# Patient Record
Sex: Male | Born: 1935 | Race: White | Hispanic: Yes | Smoking: Former smoker
Health system: Southern US, Community
[De-identification: ages and names within clinical notes are randomized; demographics above are authoritative.]

## PROBLEM LIST (undated history)

## (undated) DIAGNOSIS — I739 Peripheral vascular disease, unspecified: Secondary | ICD-10-CM

## (undated) DIAGNOSIS — H269 Unspecified cataract: Secondary | ICD-10-CM

## (undated) DIAGNOSIS — E785 Hyperlipidemia, unspecified: Secondary | ICD-10-CM

## (undated) DIAGNOSIS — Z992 Dependence on renal dialysis: Secondary | ICD-10-CM

## (undated) DIAGNOSIS — I10 Essential (primary) hypertension: Secondary | ICD-10-CM

## (undated) DIAGNOSIS — N186 End stage renal disease: Secondary | ICD-10-CM

## (undated) HISTORY — DX: Unspecified cataract: H26.9

## (undated) HISTORY — DX: Essential (primary) hypertension: I10

## (undated) HISTORY — DX: Hyperlipidemia, unspecified: E78.5

## (undated) HISTORY — DX: Peripheral vascular disease, unspecified: I73.9

---

## 2000-10-14 ENCOUNTER — Ambulatory Visit (HOSPITAL_COMMUNITY): Admission: RE | Admit: 2000-10-14 | Discharge: 2000-10-14 | Payer: Self-pay | Admitting: Cardiovascular Disease

## 2000-10-14 ENCOUNTER — Encounter: Payer: Self-pay | Admitting: Cardiovascular Disease

## 2001-04-13 ENCOUNTER — Inpatient Hospital Stay (HOSPITAL_COMMUNITY): Admission: AD | Admit: 2001-04-13 | Discharge: 2001-04-15 | Payer: Self-pay | Admitting: Cardiovascular Disease

## 2008-01-02 ENCOUNTER — Ambulatory Visit: Payer: Self-pay | Admitting: Surgery

## 2008-01-04 ENCOUNTER — Ambulatory Visit: Payer: Self-pay | Admitting: Surgery

## 2008-01-04 ENCOUNTER — Ambulatory Visit (HOSPITAL_COMMUNITY): Admission: RE | Admit: 2008-01-04 | Discharge: 2008-01-04 | Payer: Self-pay | Admitting: Surgery

## 2008-01-23 ENCOUNTER — Ambulatory Visit: Payer: Self-pay | Admitting: Surgery

## 2008-03-12 ENCOUNTER — Ambulatory Visit: Payer: Self-pay | Admitting: Surgery

## 2008-04-16 ENCOUNTER — Ambulatory Visit: Payer: Self-pay | Admitting: Surgery

## 2008-06-08 ENCOUNTER — Ambulatory Visit (HOSPITAL_COMMUNITY): Admission: RE | Admit: 2008-06-08 | Discharge: 2008-06-08 | Payer: Self-pay | Admitting: Vascular Surgery

## 2008-06-08 ENCOUNTER — Ambulatory Visit: Payer: Self-pay | Admitting: Vascular Surgery

## 2008-06-18 ENCOUNTER — Ambulatory Visit: Payer: Self-pay | Admitting: Surgery

## 2008-06-22 ENCOUNTER — Ambulatory Visit (HOSPITAL_COMMUNITY): Admission: RE | Admit: 2008-06-22 | Discharge: 2008-06-22 | Payer: Self-pay | Admitting: Surgery

## 2009-08-30 ENCOUNTER — Ambulatory Visit: Payer: Self-pay | Admitting: Vascular Surgery

## 2009-08-30 ENCOUNTER — Ambulatory Visit (HOSPITAL_COMMUNITY): Admission: RE | Admit: 2009-08-30 | Discharge: 2009-08-30 | Payer: Self-pay | Admitting: Vascular Surgery

## 2009-09-02 ENCOUNTER — Inpatient Hospital Stay (HOSPITAL_COMMUNITY): Admission: EM | Admit: 2009-09-02 | Discharge: 2009-09-02 | Payer: Self-pay | Admitting: Emergency Medicine

## 2009-10-08 ENCOUNTER — Ambulatory Visit: Payer: Self-pay | Admitting: Vascular Surgery

## 2009-10-24 ENCOUNTER — Ambulatory Visit (HOSPITAL_COMMUNITY): Admission: RE | Admit: 2009-10-24 | Discharge: 2009-10-24 | Payer: Self-pay | Admitting: Vascular Surgery

## 2009-10-24 ENCOUNTER — Ambulatory Visit: Payer: Self-pay | Admitting: Vascular Surgery

## 2009-10-24 ENCOUNTER — Emergency Department (HOSPITAL_COMMUNITY): Admission: EM | Admit: 2009-10-24 | Discharge: 2009-10-25 | Payer: Self-pay | Admitting: Emergency Medicine

## 2009-10-24 HISTORY — PX: AV FISTULA PLACEMENT: SHX1204

## 2009-12-31 ENCOUNTER — Ambulatory Visit: Payer: Self-pay | Admitting: Vascular Surgery

## 2010-01-02 ENCOUNTER — Ambulatory Visit (HOSPITAL_COMMUNITY): Admission: RE | Admit: 2010-01-02 | Discharge: 2010-01-02 | Payer: Self-pay | Admitting: Vascular Surgery

## 2010-01-02 ENCOUNTER — Ambulatory Visit: Payer: Self-pay | Admitting: Vascular Surgery

## 2010-01-28 ENCOUNTER — Ambulatory Visit: Payer: Self-pay | Admitting: Vascular Surgery

## 2010-02-20 ENCOUNTER — Ambulatory Visit: Payer: Self-pay | Admitting: Vascular Surgery

## 2010-02-20 ENCOUNTER — Inpatient Hospital Stay (HOSPITAL_COMMUNITY): Admission: RE | Admit: 2010-02-20 | Discharge: 2010-02-24 | Payer: Self-pay | Admitting: Vascular Surgery

## 2010-02-20 HISTORY — PX: PR VEIN BYPASS GRAFT,AORTO-FEM-POP: 35551

## 2010-03-17 ENCOUNTER — Ambulatory Visit: Payer: Self-pay | Admitting: Vascular Surgery

## 2010-03-18 ENCOUNTER — Ambulatory Visit: Payer: Self-pay | Admitting: Vascular Surgery

## 2010-06-09 ENCOUNTER — Encounter (INDEPENDENT_AMBULATORY_CARE_PROVIDER_SITE_OTHER): Payer: Medicare Other

## 2010-06-09 DIAGNOSIS — Z48812 Encounter for surgical aftercare following surgery on the circulatory system: Secondary | ICD-10-CM

## 2010-06-09 DIAGNOSIS — I739 Peripheral vascular disease, unspecified: Secondary | ICD-10-CM

## 2010-06-17 LAB — RENAL FUNCTION PANEL
Albumin: 3 g/dL — ABNORMAL LOW (ref 3.5–5.2)
Calcium: 8.2 mg/dL — ABNORMAL LOW (ref 8.4–10.5)
GFR calc Af Amer: 9 mL/min — ABNORMAL LOW (ref >60)
GFR calc non Af Amer: 8 mL/min — ABNORMAL LOW (ref >60)
Phosphorus: 5.2 mg/dL — ABNORMAL HIGH (ref 2.3–4.6)
Potassium: 4.3 mEq/L (ref 3.5–5.1)
Sodium: 135 mEq/L (ref 135–145)

## 2010-06-17 LAB — URINALYSIS, ROUTINE W REFLEX MICROSCOPIC
Glucose, UA: NEGATIVE mg/dL
Hgb urine dipstick: NEGATIVE
Specific Gravity, Urine: 1.018 (ref 1.005–1.030)
pH: 5.5 (ref 5.0–8.0)

## 2010-06-17 LAB — COMPREHENSIVE METABOLIC PANEL
ALT: 23 U/L (ref 0–53)
AST: 22 U/L (ref 0–37)
Alkaline Phosphatase: 90 U/L (ref 39–117)
CO2: 34 mEq/L — ABNORMAL HIGH (ref 19–32)
Calcium: 9.5 mg/dL (ref 8.4–10.5)
Chloride: 92 mEq/L — ABNORMAL LOW (ref 96–112)
GFR calc non Af Amer: 9 mL/min — ABNORMAL LOW (ref >60)
Glucose, Bld: 238 mg/dL — ABNORMAL HIGH (ref 70–99)
Sodium: 134 mEq/L — ABNORMAL LOW (ref 135–145)
Total Bilirubin: 0.4 mg/dL (ref 0.3–1.2)

## 2010-06-17 LAB — GLUCOSE, CAPILLARY
Glucose-Capillary: 126 mg/dL — ABNORMAL HIGH (ref 70–99)
Glucose-Capillary: 156 mg/dL — ABNORMAL HIGH (ref 70–99)
Glucose-Capillary: 162 mg/dL — ABNORMAL HIGH (ref 70–99)
Glucose-Capillary: 170 mg/dL — ABNORMAL HIGH (ref 70–99)
Glucose-Capillary: 199 mg/dL — ABNORMAL HIGH (ref 70–99)
Glucose-Capillary: 211 mg/dL — ABNORMAL HIGH (ref 70–99)
Glucose-Capillary: 218 mg/dL — ABNORMAL HIGH (ref 70–99)
Glucose-Capillary: 237 mg/dL — ABNORMAL HIGH (ref 70–99)
Glucose-Capillary: 241 mg/dL — ABNORMAL HIGH (ref 70–99)
Glucose-Capillary: 255 mg/dL — ABNORMAL HIGH (ref 70–99)
Glucose-Capillary: 98 mg/dL (ref 70–99)

## 2010-06-17 LAB — CBC
HCT: 31.7 % — ABNORMAL LOW (ref 39.0–52.0)
HCT: 38.7 % — ABNORMAL LOW (ref 39.0–52.0)
Hemoglobin: 10.3 g/dL — ABNORMAL LOW (ref 13.0–17.0)
Hemoglobin: 12.9 g/dL — ABNORMAL LOW (ref 13.0–17.0)
MCHC: 31.7 g/dL (ref 30.0–36.0)
MCHC: 32.5 g/dL (ref 30.0–36.0)
MCHC: 33.3 g/dL (ref 30.0–36.0)
MCV: 94.8 fL (ref 78.0–100.0)
Platelets: 182 10*3/uL (ref 150–400)
RBC: 3.05 MIL/uL — ABNORMAL LOW (ref 4.22–5.81)
RBC: 4.13 MIL/uL — ABNORMAL LOW (ref 4.22–5.81)
RDW: 14.6 % (ref 11.5–15.5)
RDW: 14.8 % (ref 11.5–15.5)
WBC: 10.2 10*3/uL (ref 4.0–10.5)
WBC: 7.6 10*3/uL (ref 4.0–10.5)

## 2010-06-17 LAB — CROSSMATCH
Antibody Screen: NEGATIVE
Unit division: 0

## 2010-06-17 LAB — POCT I-STAT 4, (NA,K, GLUC, HGB,HCT)
Hemoglobin: 13.6 g/dL (ref 13.0–17.0)
Sodium: 136 mEq/L (ref 135–145)

## 2010-06-17 LAB — BASIC METABOLIC PANEL
BUN: 53 mg/dL — ABNORMAL HIGH (ref 6–23)
Calcium: 8 mg/dL — ABNORMAL LOW (ref 8.4–10.5)
Creatinine, Ser: 9.23 mg/dL — ABNORMAL HIGH (ref 0.4–1.5)
GFR calc non Af Amer: 6 mL/min — ABNORMAL LOW (ref >60)
GFR calc non Af Amer: 7 mL/min — ABNORMAL LOW (ref >60)
Glucose, Bld: 152 mg/dL — ABNORMAL HIGH (ref 70–99)
Glucose, Bld: 184 mg/dL — ABNORMAL HIGH (ref 70–99)
Potassium: 5 mEq/L (ref 3.5–5.1)
Sodium: 132 mEq/L — ABNORMAL LOW (ref 135–145)

## 2010-06-17 LAB — URINE MICROSCOPIC-ADD ON

## 2010-06-17 LAB — ABO/RH: ABO/RH(D): O POS

## 2010-06-17 LAB — PROTIME-INR: Prothrombin Time: 13.3 seconds (ref 11.6–15.2)

## 2010-06-17 LAB — SURGICAL PCR SCREEN: Staphylococcus aureus: NEGATIVE

## 2010-06-18 NOTE — Procedures (Unsigned)
BYPASS GRAFT EVALUATION  INDICATION:  Follow up bypass graft placement.  HISTORY: Diabetes:  Yes. Cardiac:  No. Hypertension:  Yes. Smoking:  Previous. Previous Surgery:  Left femoral-to-popliteal bypass graft, 02/20/2010.  SINGLE LEVEL ARTERIAL EXAM                              RIGHT              LEFT Brachial:                    127                AVF Anterior tibial:             49                 81 Posterior tibial:            79                 90 Peroneal: Ankle/brachial index:        0.62               0.71  PREVIOUS ABI:  Date: 03/17/2010  RIGHT:  0.63  LEFT:  0.69  LOWER EXTREMITY BYPASS GRAFT DUPLEX EXAM:  DUPLEX:  Patent left femoral-to-popliteal bypass graft with no evidence of stenosis.  IMPRESSION: 1. Stable ankle brachial indices. 2. Patent bypass graft with velocity measurements shown on the     following worksheet.  ___________________________________________ [name]. Hart Rochester, M.D.  EM/MEDQ  D:  06/09/2010  T:  06/09/2010  Job:  [id number]

## 2010-06-19 LAB — POCT I-STAT, CHEM 8
BUN: 34 mg/dL — ABNORMAL HIGH (ref 6–23)
Creatinine, Ser: 7 mg/dL — ABNORMAL HIGH (ref 0.4–1.5)
Glucose, Bld: 146 mg/dL — ABNORMAL HIGH (ref 70–99)
Sodium: 135 mEq/L (ref 135–145)
TCO2: 26 mmol/L (ref 0–100)

## 2010-06-21 LAB — POCT I-STAT 4, (NA,K, GLUC, HGB,HCT)
Glucose, Bld: 125 mg/dL — ABNORMAL HIGH (ref 70–99)
HCT: 39 % (ref 39.0–52.0)
Hemoglobin: 13.3 g/dL (ref 13.0–17.0)
Sodium: 142 mEq/L (ref 135–145)

## 2010-06-21 LAB — SURGICAL PCR SCREEN: MRSA, PCR: NEGATIVE

## 2010-06-23 LAB — POCT I-STAT, CHEM 8
HCT: 30 % — ABNORMAL LOW (ref 39.0–52.0)
Hemoglobin: 10.2 g/dL — ABNORMAL LOW (ref 13.0–17.0)
Potassium: 3.6 mEq/L (ref 3.5–5.1)
Sodium: 139 mEq/L (ref 135–145)

## 2010-06-23 LAB — GLUCOSE, CAPILLARY
Glucose-Capillary: 108 mg/dL — ABNORMAL HIGH (ref 70–99)
Glucose-Capillary: 115 mg/dL — ABNORMAL HIGH (ref 70–99)
Glucose-Capillary: 59 mg/dL — ABNORMAL LOW (ref 70–99)

## 2010-06-23 LAB — POCT I-STAT 4, (NA,K, GLUC, HGB,HCT): Hemoglobin: 10.9 g/dL — ABNORMAL LOW (ref 13.0–17.0)

## 2010-07-17 LAB — GLUCOSE, CAPILLARY
Glucose-Capillary: 81 mg/dL (ref 70–99)
Glucose-Capillary: 123 mg/dL — ABNORMAL HIGH (ref 70–99)
Glucose-Capillary: 124 mg/dL — ABNORMAL HIGH (ref 70–99)

## 2010-07-17 LAB — POCT I-STAT 4, (NA,K, GLUC, HGB,HCT)
Glucose, Bld: 122 mg/dL — ABNORMAL HIGH (ref 70–99)
HCT: 34 % — ABNORMAL LOW (ref 39.0–52.0)
Potassium: 4 mEq/L (ref 3.5–5.1)
Sodium: 139 mEq/L (ref 135–145)

## 2010-08-19 NOTE — Assessment & Plan Note (Signed)
OFFICE VISIT   [name], [name]  DOB:  [birth year]                                       01/23/2008  ZOXWR#:[id number]   HISTORY:  This is the 73-year gentleman who underwent right upper arm AV  fistula performed on September 30th.  He comes in today for follow-up.   EXAM:  There is a thrill within his fistula.  There is no evidence of  infection, he does not have evidence of hand ischemia.  I am going to  see him back in 6 weeks to evaluate his maturation of his fistula.   Jorge Ny, MD  Electronically Signed   VWB/MEDQ  D:  01/23/2008  T:  01/24/2008  Job:  [id number]   cc:   [employer]

## 2010-08-19 NOTE — Assessment & Plan Note (Signed)
OFFICE VISIT   [name], [name]  DOB:  1933/08/02                                       01/02/2008  PPIRJ#:[id number]   REASON FOR VISIT:  Evaluate for dialysis.   HISTORY:  This is a 77 year old gentleman with chronic kidney disease,  not yet requiring dialysis, that I am seeing at the request of Dr. Hyman Hopes  for placement of permanent access.  The patient had recent deterioration  in his creatinine that measured 1.9 back in October 2008.  It has  progressively and it is now greater than 3.  He comes in for discussion  of permanent access placement.  He is left-handed.   PAST MEDICAL HISTORY:  Significant for:  1. Diabetes type 2.  2. Chronic renal disease.  3. History of hyperlipidemia.  4. Hypertension.  5. Congestive heart failure.  6. Intermittent claudication.  7. Glaucoma.  8. Cataracts.   REVIEW OF SYSTEMS:  GENERAL:  Positive for weight gain.  CARDIAC:  Negative.  PULMONARY:  Negative.  GI:  Negative.  GU:  Frequent urination.  VASCULAR:  Pain in legs walking.  History of blood clot the vein.  NEURO:  Negative.  ORTHO:  Negative.  PSYCH:  Negative.  ENT:  Recent change in eyesight.  HEME:  Negative.   FAMILY HISTORY:  Significant for coronary disease in his father.   SOCIAL HISTORY:  He is single with 7 children.  He does not smoke.  He  has a history of smoking but quit 27 years ago.  Does not drink alcohol.   MEDICATIONS:  Aspirin, Lasix, hydralazine, iron, Plavix, Vytorin,  Isordil, Norvasc, clonidine, Lantus, Epogen, Ergo and Rena-Vite.   ALLERGIES:  None.   PHYSICAL EXAMINATION:  Blood pressure is 164/62, pulse is 43.  General:  He is well-appearing, in no acute distress.  Cardiovascular:  Bradycardic but regular, respirations are nonlabored.  Extremities:  Reveal palpable radial and brachial pulse on the right.  He is alert and  x3.  Neuro:  Cranial nerves II through XII are grossly intact.   DIAGNOSTIC STUDIES:  The  patient had vein mapping today.  This reveals a  marginal cephalic vein measuring 0.22-0.38 in the right upper arm.   ASSESSMENT AND PLAN:  Chronic kidney disease.   Plan:  Given the patient is not yet on dialysis, I feel it is reasonable  to proceed with an attempt at cephalic vein fistula.  This will be done  in the upper arm.  I have discussed the risks and benefits with this  patient including an approximately 80% chance of maturation and the risk  of steal syndrome infection.  I am not going to stop the patient's  Plavix.  He is going to be scheduled to have this procedure done this  Wednesday, September 30th, this will be a left upper arm AV fistula.  The risks and benefits were discussed with patient.   Jorge Ny, MD  Electronically Signed   VWB/MEDQ  D:  01/02/2008  T:  01/03/2008  Job:  [id number]

## 2010-08-19 NOTE — Assessment & Plan Note (Signed)
OFFICE VISIT   [name], [name]  DOB:  [birth year]                                       01/28/2010  VHQIO#:[id number]   The patient is a 77 year old gentleman who returns today for discussion  of possible lower extremity bypass grafting.  This patient has been  having severe claudication symptoms in the left leg for the last several  months with tingling in the foot at rest and inability to walk more than  1-2 minutes because of calf discomfort.  The right calf begins to have  symptoms following stopping because of the left calf.  Has no history of  nonhealing ulcers or infection or gangrene.  He had a recent angiogram  performed by Dr. Imogene Burn which reveals bilateral superficial femoral  occlusions, left side having been previously stented many years ago with  reconstitution of a good above-knee popliteal artery on the right and a  below-knee popliteal artery on the left with severe tibial disease.   CHRONIC MEDICAL PROBLEMS:  1. End-stage renal disease on hemodialysis Monday, Wednesday, Friday.  2. Diabetes mellitus type 2.  3. Hyperlipidemia.  4. Hypertension.  5. Congestive heart failure in the past, now resolved.   SOCIAL HISTORY:  He is single with 7 children.  Does not smoke.  Has a  history smoking, quit 27 years ago.  Does not use alcohol.   FAMILY HISTORY:  Positive for coronary artery disease in his father.   REVIEW OF SYSTEMS:  Denies any chest pain.  Does have chronic renal  insufficiency, dialysis Monday, Wednesday, Friday.  He is left-handed.  He has an upper arm fistula which is being utilized on the left.  All  other systems are negative in review of systems.   PHYSICAL EXAMINATION:  Vital signs:  Blood pressure 111/45, heart rate  68, respirations 16.  General:  He is a well-developed, well-nourished  male in no apparent distress, alert, oriented x3.  He does not speak  [place].  HEENT:  Normal for age.  EOMs intact.  Lungs:  Clear  to  auscultation.  No rhonchi or wheezing.  Cardiovascular:  Regular rhythm,  no murmurs.  Carotid pulses 3+ no bruits.  There is a right Diatek  catheter in the right infraclavicular area exiting on the chest wall.  Abdomen:  Soft, nontender with no masses.  Musculoskeletal:  Free of  major deformities.  Neurologic:  Reveals decreased sensation in the left  foot.  Skin:  Reveals no rashes.  The lower extremity exam reveals  dependent rubor in the left foot.  He has 3+ femoral pulses with absent  popliteal and distal pulses bilaterally.  Surface veins including  greater saphenous appear small bilaterally.   The previous ABIs revealed the left leg to be 0.38 with the right leg to  be 0.56.   PLAN:  Admit the patient on November 17 for left femoral popliteal  bypass graft (below-knee) using 6-mm Gore-Tex most likely.  The risks  and benefits have been thoroughly discussed and the patient would like  to proceed.     Quita Skye Hart Rochester, M.D.  Electronically Signed   JDL/MEDQ  D:  01/28/2010  T:  01/29/2010  Job:  [id number]

## 2010-08-19 NOTE — Procedures (Signed)
DIALYSIS GRAFT DUPLEX EVALUATION   INDICATION:  Right AVF evaluation due to nonmaturing.   HISTORY:  Right upper extremity AVF on 01/04/2008 by Dr. Myra Gianotti   DUPLEX:  .  Patent AVF, however, narrowing mid brachium of right  cephalic vein with velocities of 704 cm/s.  Branch noted in same area,  just slightly more distal.                                    Duplex Velocities  Inflow artery                   158 cm/s  Inflow anastomosis              115 cm/s  Mid arterial limb  Mid graft  Mid venous limb  Outflow anastomosis  Outflow vein                    72 cm/s   IMPRESSION:  1. Patent right arteriovenous fistula.  2. Right cephalic vein, mid brachium, appears narrowed with velocities      of 704 cm/s.  3. Small branch noted in the same area, just slightly more distal.    ___________________________________________  V. Charlena Cross, MD   AS/MEDQ  D:  04/16/2008  T:  04/16/2008  Job:  [id number]

## 2010-08-19 NOTE — Assessment & Plan Note (Signed)
OFFICE VISIT   MOODY, ROBBEN  DOB:  October 18, 1933                                       04/16/2008  NGEXB#:[id number]   REASON FOR VISIT:  Followup fistula.   HISTORY:  This is a 77 year old gentleman who is left-handed who  underwent a right upper extremity AV fistula on 01/04/2008.  I have not  been very pleased with the way it has matured.  He had an ultrasound  today which shows narrowing of the cephalic vein with elevated  velocities.  There are no dominant branches.   PHYSICAL EXAMINATION:  Vital signs:  On physical examination his blood  pressure is 196/71, pulse is 66.  General:  He is in no distress.  The  fistula has a good thrill proximally, however, this is diminished in the  mid upper arm.   PLAN:  As the patient is not yet on dialysis I am reluctant to proceed  with a fistulogram.  I think he does have stenosis and/or a sclerotic  portion of his cephalic vein which is preventing this from maturing.  I  do think we would potentially be able to salvage this with balloon  venoplasty.  However, given the risk of putting him on dialysis due to  the dye I am a little reluctant to do this at this time.  I think he  potentially could, the proximal portion could potentially be used as  access should he require dialysis in the near future.  Of course our  other option would be converting this to a right forearm graft.  However, at this time I would like to see if we could salvage this  fistula.  We will continue to follow him.  I will see him back in 6  weeks.   Jorge Ny, MD  Electronically Signed   VWB/MEDQ  D:  04/16/2008  T:  04/18/2008  Job:  [id number]   cc:   Dr Hyman Hopes

## 2010-08-19 NOTE — Op Note (Signed)
NAMEJOIE, REAMER            ACCOUNT NO.:  [id number]   MEDICAL RECORD NO.:  [id number]          PATIENT TYPE:  AMB   LOCATION:  SDS                          FACILITY:  [facility]   PHYSICIAN:  Juleen China IV, MDDATE OF BIRTH:  [birth year]   DATE OF PROCEDURE:  DATE OF DISCHARGE:  06/22/2008                               OPERATIVE REPORT   PREOPERATIVE DIAGNOSIS:  End-stage renal disease.   POSTOPERATIVE DIAGNOSIS:  End-stage renal disease.   PROCEDURE PERFORMED:  1. Ligation of right upper arm arteriovenous fistula.  2. Right forearm arteriovenous Gore-Tex graft with 6-mm graft.   ASSISTANT:  Jerold Coombe, P.A.   ANESTHESIA:  MAC.   BLOOD LOSS:  Minimal.   INDICATIONS:  This is a 77 year old gentleman with end-stage renal  disease.  He had previously placed a right upper arm AV fistula which  did not mature.  He has no adequate vein for fistula and therefore we  have decided to take his fistula down and place a forearm graft.   PROCEDURE:  The patient was identified in the holding area and taken to  room 6.  He was placed supine on the table.  MAC anesthesia was  administered.  The patient was prepped and draped in standard sterile  fashion.  A time-out was called.  Antibiotics were given.  A 1%  lidocaine was used for local anesthesia.  The previous incision in the  antecubital crease was anesthetized and then opened with #10 blade.  Cautery was used to divide the subcutaneous tissue.  The arteriovenous  anastomosis of the fistula was dissected out first and mobilized the  cephalic vein for a distance of approximately 2-cm proximal to the  artery.  The artery was mobilized proximal and distal.  I inspected the  brachial vein in this region.  I did not feel like they were adequate  for graft insertion.   Next, I used ultrasound to evaluate the basilic vein, the basilic vein  appeared to be about 3-mm by ultrasound and therefore I elected to make  a longitudinal  incision above the antecubital crease over the basilic  vein.  Through this incision, the basilic vein was identified and  exposed and mobilized proximally and distally for about 3 cm.  Basilic  vein on initial inspection was approximately 3-mm, it did spasm  significantly upon manipulation.  At this point, a counter-incision was  made in the distal forearm.  Subcutaneous tunnel was created between 2  incisions.  The graft of 6-mm stretch, Gore-Tex graft was brought  through the tunnel.  The patient was then given systemic heparinization.  The previous fistula was ligated with a 2-0 silk tie.  After the heparin  circulated clamps were used to occlude the artery.  The previous  arteriovenous anastomosis of his upper arm fistula was taken down.  The  Prolene sutures were removed.  The vein was then discarded.  I inspected  the artery at this time in point, the artery was healthy in appearance.  I elected to perform an end-to-side anastomosis using a running 6-0  Prolene.  Prior to completion of  anastomosis, the artery was flushed  appropriately.  The anastomosis was then secured.  There was excellent  pulsatile flow to the graft.   Next, the graft was flushed with heparin saline and reoccluded.  The  venous anastomosis was then performed next.  The vein was occluded and  venotomy was made with #11 blade which was extended with Potts scissors.  The graft was then beveled to fit the size of the venotomy, an end-to-  side anastomosis was created with a running 6-0 Prolene.  Prior to  completion, the graft was appropriately flushed.  The anastomosis was  then secured.  This was an end-to-side anastomosis.  The heparin was  then reversed with 50 mg of protamine.  The patient had excellent flow  through his graft.  He had strongly palpable radial pulse with graft  occlusion and faintly palpable pulse with graft being open.  After  hemostasis was achieved, the wounds were closed with deep layer of  3-0  and a 3-0 Vicryl and the skin was closed with 4-0 Vicryl followed by  Dermabond.  The patient tolerated the procedure well.  There were no  complications.           ______________________________  V. Charlena Cross, MD  Electronically Signed     VWB/MEDQ  D:  06/22/2008  T:  06/23/2008  Job:  [id number]

## 2010-08-19 NOTE — Assessment & Plan Note (Signed)
OFFICE VISIT   [name], [name]  DOB:  [birth year]                                       03/17/2010  ZOXWR#:[id number]   The patient returns today for initial followup regarding his left  femoral-popliteal saphenous vein graft I performed November 17 for rest  ischemia and severe claudication in the left leg.  He has done very well  since his procedure with great improvement in his ambulation and no  particular symptoms in the left leg.   On exam his incisions have healed nicely.  He has a 3+ popliteal graft  pulse easily palpable with well-perfused left foot.  Blood pressure  133/44, heart rate 80, respirations 18.   Vascular lab today revealed increased ABI from 0.38 to 0.69 on the left  and stable index of 0.63 on the right.  I reassured him regarding these  findings.  He will return in 3 months for a duplex scan of the graft and  to be followed on the protocol.     Quita Skye Hart Rochester, M.D.  Electronically Signed   JDL/MEDQ  D:  03/17/2010  T:  03/17/2010  Job:  [id number]

## 2010-08-19 NOTE — Procedures (Signed)
CEPHALIC VEIN MAPPING   INDICATION:  AV fistula placement.   HISTORY:  End-stage renal disease.   EXAM:   The right cephalic vein is compressible.   The right basilic vein is partially thrombosed.   Diameter measurements range from 0.27 to 0.22 in the cephalic vein.   The left cephalic vein is compressible.   Diameter measurements range from 0.41 to 0.17.   The left basilic vein has some intimal thickening in the forearm  segments and the diameter measurements range from 0.45 to 0.13.   See attached worksheet for all measurements.   IMPRESSION:  1. Patent right and left cephalic vein with diameters as noted above.  2. Right basilic vein is partially thrombosed.  3. The left basilic vein has some intimal thickening in the forearm      segment.   ___________________________________________  Quita Skye. Hart Rochester, M.D.   NT/MEDQ  D:  10/08/2009  T:  10/08/2009  Job:  [id number]

## 2010-08-19 NOTE — Assessment & Plan Note (Signed)
OFFICE VISIT   [name], [name]  DOB:  Aug 19, 1933                                       12/31/2009  ZOXWR#:[id number]   Patient returns for follow-up regarding his left upper arm AV fistula  which I created July 21 for end-stage renal disease and lower extremity  claudication symptoms.  The fistula is functioning well.  He  occasionally will have some cramping in the hand while on dialysis but  states this is not severe.  He has no numbness in the left hand.  He is  having severe claudication symptoms with approaching rest pain in the  left foot.  He awakens at night with foot numb and states that the toes  ache.  He has had no nonhealing ulcers or infection.  He has had  previous stenting at some point in the past, I think in his superficial  femoral arteries,  but that is not confirmed.  It was done at [facility] 10 years ago by an unknown physician.  He was told that these  stents were blocked 5 or 6 years ago.  Symptoms have continued to  worsen.  He does have diabetes, hypertension, hyperlipidemia, and  congestive heart failure by history as well as his end-stage renal  disease, dialyzing Monday, Wednesday, and Friday.   PHYSICAL EXAMINATION:  Blood pressure 140/55, heart rate 63, temperature  98.  Left upper arm AV fistula has an excellent pulse and palpable  thrill.  The left hand is warm and well-perfused.  He has 3+ femoral  pulses bilaterally with no popliteal or distal pulses.  He has dependent  rubor on the left foot.  Saphenous veins are intact but somewhat small  on physical exam.   ABI today is 0.38 on the left and 0.56 on the right with totally  occluded superficial femoral arteries bilaterally.   We will plan an angiogram by Dr. Imogene Burn on Thursday, September 29 to see  what options are available.  I think he will need revascularization in  the left leg, which will likely require bypassing since he has had  previous stenting several  years ago.     Quita Skye Hart Rochester, M.D.  Electronically Signed   JDL/MEDQ  D:  12/31/2009  T:  01/01/2010  Job:  [id number]

## 2010-08-19 NOTE — Assessment & Plan Note (Signed)
OFFICE VISIT   [name], [name]  DOB:  [birth year]                                       10/08/2009  VHQIO#:[id number]   The patient is a patient with end-stage renal disease who has had  multiple failed accesses in the right upper extremity.  This began with  a right upper extremity AV fistula.  He has never had access in the left  arm.  He is referred today for further access.  Most recent __________  was thrombectomy and revision of the right forearm AV graft.  He has  never had a right upper arm graft.   The patient also states that he does have bilateral leg claudication and  has had previous stents placed in another hospital, and we have no  information regarding this.  He is able to ambulate about a half-block  before stopping with calf discomfort.   On exam today his blood pressure is 94/54, heart rate 64, respirations  16.  Upper extremity exam reveals 3+ brachial pulses bilaterally.  Right  arm has a thrombosed AV graft in the forearm.  Left arm has no evidence  of access previously.  Today I ordered vein mapping to both upper  extremities, which I reviewed and interpreted.  The left upper extremity  appears to have a widely patent cephalic vein down to the antecubital  area and extending into the proximal forearm.  The left arm does not  have any veins adequate for fistula.   I have scheduled him for a left arm AV fistula to be done on Thursday,  July 21, as an outpatient at [facility].  When he returns, we will  evaluate his lower extremities with a duplex scan of his arterial system  with ABIs to determine the next step for that evaluation.     Quita Skye Hart Rochester, M.D.  Electronically Signed   JDL/MEDQ  D:  10/08/2009  T:  10/09/2009  Job:  [id number]

## 2010-08-19 NOTE — Op Note (Signed)
[name], [name]            ACCOUNT NO.:  [id number]   MEDICAL RECORD NO.:  [id number]          PATIENT TYPE:  AMB   LOCATION:  SDS                          FACILITY:  [facility]   PHYSICIAN:  Di Kindle. Edilia Bo, M.D.DATE OF BIRTH:  01-06-1934   DATE OF PROCEDURE:  06/08/2008  DATE OF DISCHARGE:  06/08/2008                               OPERATIVE REPORT   PREOPERATIVE DIAGNOSIS:  Chronic kidney disease.   POSTOPERATIVE DIAGNOSIS:  Chronic kidney disease.   PROCEDURE:  Ultrasound-guided access to the right IJ and placement of 28-  cm Diatek catheter.   SURGEON:  Di Kindle. Edilia Bo, MD   ASSISTANT:  Nurse.   ANESTHESIA:  Local with sedation.   TECHNIQUE:  The patient was taken to the operating room and the  ultrasound scanner was used to mark both IJs, which appeared to be  patent.  The neck and upper chest were prepped and draped in the usual  sterile fashion.  The patient was placed in Trendelenburg.  The skin was  anesthetized with 1% lidocaine.  Under ultrasound guidance, the right IJ  was cannulated and the guidewire introduced into the superior vena cava  under fluoroscopic control.  The exit site for the catheter was selected  and the skin anesthetized between the two areas.  Catheter was then  brought through the tunnel, cut to the appropriate length and the distal  ports were attached.  Both ports withdrew easily.  We then flushed with  heparinized saline and filled with concentrated heparin.  The catheter  was secured at its exit site with a 3-0 nylon suture.  The IJ  cannulation site was closed with a 4-0 subcuticular stitch.  Sterile  dressing was applied.  The patient tolerated the procedure well and was  transferred to the recovery room in satisfactory condition.  All needle  and sponge counts were correct.      Di Kindle. Edilia Bo, M.D.  Electronically Signed     CSD/MEDQ  D:  06/08/2008  T:  06/08/2008  Job:  [id number]

## 2010-08-19 NOTE — Assessment & Plan Note (Signed)
OFFICE VISIT   [name], [name]  DOB:  1933-04-21                                       06/18/2008  XLKGM#:[id number]   REASON FOR VISIT:  Dialysis access.   HISTORY:  This is a 77 year old gentleman who is left-handed.  He  underwent a right upper arm fistula on January 04, 2008.  At that time  he was not yet on dialysis.  He had difficulty maturing.  We have been  watching this as I had not wanted to perform a dye study given that he  is not yet on dialysis.  Unfortunately he has progressed to dialysis and  now has a right-sided catheter.  He comes in today for access planning.   PHYSICAL EXAMINATION:  VITAL SIGNS:  He is afebrile, blood pressure  109/57, pulse is 60.  GENERAL:  He is in no distress.  CARDIOVASCULAR:  Regular rate and rhythm.  EXTREMITIES:  The right arm fistula, I feel a pulse for the first 3 cm  of his fistula and then it disappears.  I presume this is occluded.  The  vein appears to be small by my exam. This is much different than it was  the last time saw him.   ASSESSMENT/PLAN:  End-stage renal disease needing access.   PLAN:  I think it is best at this point to place a forearm graft.  The  patient's vein in his left arm is equally disease as his right arm.  I  discussed the risks and benefits in proceeding with a right forearm AV  Gore-Tex graft with the daughter who served as a Nurse, learning disability.  At that  time of his graft I would also ligate his fistula.  This will be  scheduled for Friday, June 22, 2008.   Jorge Ny, MD  Electronically Signed   VWB/MEDQ  D:  06/18/2008  T:  06/20/2008  Job:  [id number]   cc:   Elvis Coil

## 2010-08-19 NOTE — Procedures (Signed)
CEPHALIC VEIN MAPPING   INDICATION:  End-stage renal disease.   HISTORY:  End-stage renal disease.   EXAM:  Bilateral cephalic vein duplex.   The right cephalic vein is compressible.   Diameter measurements range from 0.23 to 0.22.   The left cephalic vein is compressible.   Diameter measurements range from 0.25 to 0.27.   See attached worksheet for all measurements.   IMPRESSION:  Patent bilateral cephalic veins which are not of acceptable  diameter for use as a dialysis access site.   ___________________________________________  V. Charlena Cross, MD   MG/MEDQ  D:  01/02/2008  T:  01/02/2008  Job:  [id number]

## 2010-08-19 NOTE — Assessment & Plan Note (Signed)
OFFICE VISIT   [name], [name]  DOB:  Mar 31, 1934                                       03/12/2008  NWGNF#:[id number]   REASON FOR VISIT:  Follow up fistula.   HISTORY:  This is a 77 year old gentleman who underwent a right upper  extremity arteriovenous fistula on 01/04/2008.  He comes in today for  further evaluation.   On examination, the vein has a slight pulse to it.  There is a vein at  the antecubital fossa that appears to be of adequate caliber; however,  it does appear to narrow down in the upper arm.  This may be due to  intrinsic venous pathology.   I would like to give this another month to see if it matures any  further.  He is going to come back and see me in a month.  We will have  an ultrasound prior to seeing me to see if there are any additional  branches.  Given that he is not yet on dialysis, I would not proceed  with a fistulogram at this time.   Jorge Ny, MD  Electronically Signed   VWB/MEDQ  D:  03/12/2008  T:  03/13/2008  Job:  [id number]   cc:   [facility]

## 2010-08-19 NOTE — Op Note (Signed)
NAMEGARDNER, [name]            ACCOUNT NO.:  [id number]   MEDICAL RECORD NO.:  [id number]          PATIENT TYPE:  AMB   LOCATION:  SDS                          FACILITY:  [facility]   PHYSICIAN:  VDurene Cal IV, MDDATE OF BIRTH:  12/06/1933   DATE OF PROCEDURE:  01/04/2008  DATE OF DISCHARGE:                               OPERATIVE REPORT   PREOPERATIVE DIAGNOSIS:  Chronic kidney disease.   POSTOPERATIVE DIAGNOSIS:  Chronic kidney disease.   PROCEDURE PERFORMED:  Right upper extremity arteriovenous fistula.   TYPE OF ANESTHESIA:  MAC.   FINDINGS:  Vein would only accept a 3 dilator.   PROCEDURE:  The patient was identified in the holding and taken to room  #8.  He was placed supine on the table.  The right arm was prepped and  draped in standard sterile fashion.  A time-out was called.  Antibiotics  were given.  Cephalic vein was visualized with ultrasound and marked.  A  transverse incision was made in the antecubital crease.  The vein was  first identified within the incision.  It was encircled with vessel loop  and then exposed proximally and distally until adequate length had been  obtained.  It was marked for orientation.  Next, the brachial artery was  exposed.  The artery was approximately 4 mm.  At this point in time, the  patient was given systemic heparinization.  The vein was transected  distally.  The distal one was oversewn with a 2-0 silk tie.  Vein was  then flushed with heparinized saline.  It was dilated with sequential  coronary dilators.  At about the mid upper arm had resistance with the  dilator.  It did accept a 3 dilator; however, I could not get any thing  larger to pass this.  Since the patient is not yet on dialysis, I would  attempt AV fistula.  The artery was occluded proximally and distally  with vascular clamps.  It was opened with an #11 blade and extended with  Potts scissors.  The vein was spatulated and end-to-side anastomosis was  created  using 6-0 Prolene in a running fashion.  Prior to completion of  the anastomosis, the artery was flushed in antegrade and retrograde  fashion.  The anastomosis was then secured, the clamps were released.  The patient had a palpable thrill all the way up to his shoulder.  It  sounded good with Doppler signal.  He also had Dopplers in his radial  and ulnar artery.  I ensured and made sure that there were no kinks  within the course of the vein.  The wound was then irrigated and closed  with 3-0 and 4-0 Vicryl.  The patient was taken to recovery room in  stable condition.  There were no complications.           ______________________________  V. Charlena Cross, MD  Electronically Signed     VWB/MEDQ  D:  01/04/2008  T:  01/05/2008  Job:  [id number]

## 2010-08-19 NOTE — Procedures (Signed)
LOWER EXTREMITY ARTERIAL DUPLEX   INDICATION:  Claudication with history of lower extremity stents.   HISTORY:  Diabetes:  No.  Cardiac:  CHF.  Hypertension:  Yes.  Smoking:  Previous.  Previous Surgery:  History of bilateral dialysis access sites in the  upper extremities and lower extremity stents.   SINGLE LEVEL ARTERIAL EXAM                          RIGHT                LEFT  Brachial:               128  Anterior tibial:        33                   42  Posterior tibial:       72                   48  Peroneal:  Ankle/Brachial Index:   0.56                 0.38   LOWER EXTREMITY ARTERIAL DUPLEX EXAM   DUPLEX:  1. Patent bilateral common femoral and popliteal arteries with mild-to-      moderate plaque formations noted.  2. The bilateral superficial femoral arteries appear totally occluded      at the proximal-to-mid/distal thigh region on the right and the      proximal-to-distal thigh region on the left.   IMPRESSION:  1. The bilateral superficial femoral arteries appear totally occluded,      as described above.  2. Moderate decrease of the right ankle brachial index with a moderate-      to-severe decrease of the left ankle brachial index noted.   ___________________________________________  Quita Skye Hart [place], M.D.   CH/MEDQ  D:  01/01/2010  T:  01/01/2010  Job:  [id number]

## 2010-08-22 NOTE — Discharge Summary (Signed)
[place]  Patient:    [name], [name] Visit Number: [id number] MRN: [id number]          Service Type: Attending:  Gerlene Burdock A. Alanda Amass, M.D. Dictated by:   Abelino Derrick, P.A.-C Adm. Date:  04/12/01 Disc. Date: 04/15/01   CC:         Silvano Bilis, M.D.  Runell Gess, M.D.   Discharge Summary  DISCHARGE DIAGNOSES: 1. Peripheral vascular disease, complex left superficial femoral artery    intervention this admission. 2. Non-insulin-dependent diabetes mellitus. 3. Elevated LDL.  HOSPITAL COURSE:  [name] is a 77 year old Hispanic male with a history of vascular disease.  He has non-insulin-dependent diabetes.  He was referred to Dr. Allyson Sabal for claudication, right greater than left.  He did have Dopplers at [facility] which revealed an ABI of 0.35 on the right and 0.7 on the left.  Peripheral angiography done on October 14, 2000, revealed a total right superficial femoral artery and a 60% mid left superficial femoral artery with one vessel runoff bilaterally.  He has had progressive pain in his left foot. On examination in the office he had dependent ruber and pain at rest.  He was admitted for peripheral angiography, which was done on 04/12/01, by Dr. Alanda Amass.  This was a long complex procedure.  The patient did have significant hypertension prior to the procedure.  He had total occlusion of the proximal left superficial femoral artery, and high grade mid popliteal lesion, and a patent left profunda.  He underwent left superficial femoral artery PTA and stenting.  Dr. Alanda Amass did note there was probably a high risk of re-occlusion.  Dr. Alanda Amass suggested the patient may need vascular consult in the future.  Post procedure Dopplers done 04/13/01, revealed an increase in left ABIs post procedure with no significant change on the right. His ABI on the left went from 0.27 to 0.43.  The patient had some  recurrent left calf pain on 04/14/01, which was tender to palpation.  His foot was warm. Dopplers were done again, and this revealed no evidence of deep venous thrombosis, and his ABIs were unchanged at 0.41 on the right and 0.43 on the left.  We feel the patient can be discharged on 04/15/01.  He will have Dopplers done on April 26, 2001, at 5 p.m., and see Dr. Allyson Sabal on 04/29/01, at 10 a.m.  DISCHARGE MEDICATIONS: 1. Altace 5 mg q.d. 2. Coated aspirin q.d. 3. Glucovance XR 500 mg b.i.d. 4. Plavix 75 mg q.d. x1 month.  LABORATORY DATA:  Sodium 141, potassium 3.9, BUN 8, creatinine 0.9, white count 7.8, hemoglobin 13.1, hematocrit 36.9, platelets 206.  Hemoglobin A1C 8.6.  INR 1.1.  Liver function tests are normal.  Lipid profile reveals a cholesterol of 189, HDL 48, LDL 131.  TSH 1.10.  Urinalysis did show some proteinuria.  Telemetry revealed sinus rhythm.  Chest x-ray done on 10/14/00, revealed no acute changes.  EKG from 11/22/00, revealed sinus rhythm without acute changes.  DISPOSITION:  The patient is discharged in stable condition.  He will have arterial Dopplers on 04/26/01, at 5 p.m., and see Dr. Allyson Sabal on 04/29/01, at 10 a.m.  As noted, per Dr. Alanda Amass, he may need a vascular consult at some point.  We should also consider outpatient Cardiolite study, this will be deferred to Dr. Allyson Sabal for now. Dictated by:   Abelino Derrick, P.A.-C Attending:  Pearletha Furl Alanda Amass, M.D. DD:  04/15/01 TD:  04/15/01 Job: [id number]  [id number]

## 2010-08-22 NOTE — Op Note (Signed)
[facility]  Patient:    [name], [name] Visit Number: [id number] MRN: [id number]          Service Type: DSU Location: [id number] Attending Physician:  Ruta Hinds Dictated by:   Pearletha Furl Alanda Amass, M.D. Proc. Date: 04/12/01 Admit Date:  04/12/2001   CC:         Runell Gess, M.D.             Silvano Bilis, M.D., [phone], [place], [place] [address]             [facility] [address] [place], [place]             6th Floor CP Lab                           Operative Report  PROCEDURE:  Retrograde abdominal aortic catheterization, abdominal aortic angiogram PA projection, bilateral iliac angiography PA and oblique projections, left lower extremity runoff using bolus chase technique with DSA, selective left SFA angiography and left popliteal and left tibial angiography PA and oblique projections, recanalization totally occluded left SFA, rheolytic thrombectomy left SFA with 6-French AngioJet device, PTA high-grade proximal, mid, and distal left SFA, PTA high-grade left popliteal stenosis, tandem segmental stenting left SFA with postdeployment dilatation 6 mm balloon, Plavix 150, Aggrastat bolus plus infusion, weight-adjusted heparin, IC nitroglycerin administration, intravenous labetalol for blood pressure control.  The above procedure was done through the right SFA which was entered with a single anterior puncture using an 18 thin-wall needle, and a 5-French Cordis side-arm sheath was inserted using modified Seldinger technique without difficulty.  A Wholey wire used to traverse the iliac system and for catheter exchange.  The patient was hydrated preoperatively.  His Glucovance was on hold, and his baseline creatinine was 0.8, H&H 14.2/40.9.  This was a long, complex procedure intervention and required sedation.  The patient had a total of 6 mg of Nubain IV, 3 mg of Versed, 100 mcg of fentanyl IV.   Xylocaine 1% was used for local anesthesia.  Arterial pressures were monitored throughout the procedure and were severely high at 195 up to 240 mmHg.  With discomfort during the procedure, blood pressures went up to 240 mmHg and required labetalol 10 mg IV along with intravenous nitroglycerin for lowering down to the 200 range.  At the end of the procedure, blood pressures were still 200-220 systolic with the patient stable in sinus rhythm, and he was started on a fenoldopam drip for blood pressure control.  He remained in sinus rhythm during the procedure.  Diagnostic abdominal angiogram was done through a 5-French catheter above the level of the iliac bifurcation with DSA imaging, 25 cc 20 cc per second.  This demonstrated moderate atherosclerotic disease of the distal aorta, no ulcer or aneurysm formation.  The IMA was intact.  The right common iliac was irregular with no significant stenosis.  The right external iliac showed some minor narrowing probably related to the sheath and no significant narrowing on subsequent injection through the sheath at the end of the procedure.  The hypogastrics were intact bilaterally.  The LCIA was a long, pulsatile vessel with a 40-50% narrowing in the proximal third.  Initially, this was felt to possibly be a shelf-like lesion; however, there was no gradient across this lesion on transstenotic gradient measurement with a Balkan sheath and final injection by hand showing no significant  stenosis at the end of the procedure.  The left external iliac had irregularities with about 40% narrowing in the proximal portion but good flow.  The left lower extremity angiogram was done through an endhole catheter placed through paced after exchange over a Wholey wire from a 5-French IMA catheter.  This was done at 45 cc for 8 cc per second.  Angiography demonstrated 60-70% narrowing of the profunda with irregularities after the bifurcation of the deep femoral  circumflex.  The left SFA was totally occluded several centimeters after its origin with no antegrade filling.  There was reconstitution of the distal SFA at [facility] canal in the area of prior disease via profunda collaterals.  There was a high-grade 95% mid popliteal stenosis visualized on collateral flow.  There was slow filling of the tibial trifurcation.  There was flow into the LAD and left peroneal with total occlusion of the left posterior tibial vessel which in the past showed collateral flow by calcaneal at the level of the ankle.  This was also evident on this angiogram.  The proximal LAD had diffuse disease as did the proximal left peroneal.  This 77 year old, [place] born, father of five with one grandchild is a nonsmoker, had AODM and no history of coronary disease.  He was initially referred to Dr. Allyson Sabal for significant claudication with abnormal ABIs and underwent angiography on October 14, 2000.  He was found to have total segmental right SFA occlusion with profunda collaterals and one vessel runoff via the RPT which was occluded at the ankle.  He had only mild disease in the proximal left SFA, 60% segmental disease in the distal left SFA at [facility] canal and no significant popliteal disease then with one and a half vessel runoff on the left.  He was treated medically.  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 He was referred back to Dr. Allyson Sabal for three weeks of progressive claudication of the left lower extremity and recent onset of rest pain without neuropathy, absent left pedal pulses, and probable left SFA occlusion on acute Doppler showing LABI of .27 and a chronic RABI of .45.  He was referred for angiography in the setting of clinical limb ischemia LLE with rest pain.  In view of this critical limb ischemia, it was felt best to try to pursue recanalization and reperfusion of his LLE.  The system was switched to a crossover 7-French Balkan sheath.  A  5-French endhole catheter was used over a Wholey wire.  A combination of a 0.035 inch  Wholey wire, a 0.035 inch glidewire, and a 0.014 inch stabilizer wire was used to recanalize the totally occluded left SFA under fluoroscopic control.  The 0.014 inch wire was necessary to cross the distal portion of the SFA.  The endhole catheter was then used to cross the stenosis, and the wire was exchanged for a 0.035 inch Wholey wire.  Angiography with DSA imaging showed no significant flow in the left SFA after wire recanalization.  Hand injections after 4 mm balloon dilatation of the left popliteal outflow lesion with a Cordis Powerflex balloon and a similar dilatation with the same balloon of the occlusion site of the proximal left SFA with a 4 mm followed by 4 mm x 8 cm balloon showed diffuse thrombosis throughout the proximal, mid, and distal SFA.  It was elected to proceed with rheolytic thrombectomy and this was done with a 6-French AngioJet "expedia" catheter.  Multiple runs antegrade and retrograde in a slow fashion  were performed (total of 4-5 runs).  During this time, the patient had discomfort of his left lower extremity requiring sedation.  Following this, scout injections were done by hand injection, showing improved flow but continued stenosis, particularly in the proximal and distal-mid portion of the left SFA segmentally.  Balloon inflations with a 5 mm x 8 cm balloon were done in the proximal and distal portion of the vessel.  We then had to proceed to stenting of the distal left SFA which was done with a nitinol Cordis "SMART" 7 x 10 cm stent deployed under fluoroscopic control, covering Hunters canal in the distal left SFA.  There was significant disease proximal to the segmentally, and this required a second 7 mm x 10 cm overlapping "SMART" stent.  These were both postdilated with 5 mm balloon. Following this, there was good flow restored to the left SFA; however, there was  continued shelf-like lesion in the very proximal left SFA just proximal to the stent.  This was covered with an overlapping 7 mm x 4 cm Cordis Smart self-expanding nitinol stent.  The entire stented area extending from the proximal SFA down past Hunters canal approximately 16-20 cm was postdilated with a 6 mm x 10 cm balloon from 6-8 atmospheres for 30-45 seconds with multiple dilatations.  Following this, there was excellent opening of the LSFA.  There was, however, residual stenosis in the mid left popliteal.  Using exchange technique, a 5 mm x 2 cm balloon was then used to dilate the mid left popliteal at 5-6 atmospheres for 30 seconds.  Final injection using DSA showed adequate result with less than 40% residual narrowing of the left popliteal, localized dissection but flow beyond this.  There was flow into the diseased left peroneal and the diseased LAT which were visualized to the ankle region with collaterals at the ankle above the foot to the LPT.  There was essentially single-vessel runoff to the left foot by a diseased LAT but much improved.  The wire was then pulled back, and selective injection of the LCIA through the Balkan sheath showed less than 40% narrowing with no gradient.  There was no damage to the iliac bifurcation.  The Balkan sheath was then exchanged for a short 7 French side-arm sheath in RFA, and hand injection showed no significant iliac stenosis on the right.  Final ACT was 190 seconds.  The side-arm sheath was secured to the skin.  The patient was transferred to the holding area.  Fenoldopam drip was started.  He was on Aggrastat bolus plus infusion and IV nitroglycerin.  He was stable clinically.  Unfortunately, this patient has an extremely high risk of reocclusion because of the necessary long stenting of the SFA following recanalization and rheolytic thrombectomy.  He also has poor runoff with diseased left peroneal and LAT.  Fortunately, his left  profunda is intact.  At present, if he is able to tolerate this, I would keep him on Aggrastat bolus plus infusion overnight, continue aspirin and Plavix, and consider Pletal therapy.  The patient does not have a history of coronary disease clinically but with his severe peripheral vascular disease and age of 41, I suspect he has underlying coronary disease.  His lipid status is currently unknown, and he has known AODM.  Further recommendations pending the short and long-term outcome of his current procedure and symptomatic status.  CATHETERIZATION DIAGNOSES:  1. Peripheral arterial disease - critical limb ischemia.  2. Recent occlusion left superficial femoral artery, severe rapid  progression     of disease since prior angiography of October 14, 2000.  3. Successful recanalization, rheolytic thrombectomy, and segmental stenting     left superficial femoral artery.  4. Successful percutaneous transluminal angioplasty left popliteal.  5. Known total right superficial femoral artery occlusion with profunda     collaterals and one-vessel runoff right lower extremity via right     posterior tibial.  6. Adult onset diabetes mellitus.  7. Lipid status probable hyperlipidemia.  Lipid status currently unknown.  8. No clinical angina.  9. Current nonsmoker. 10. Severe systemic hypertension with normal renal arteries at prior     angiogram. Dictated by:   Pearletha Furl. Alanda Amass, M.D. Attending Physician:  Ruta Hinds DD:  04/12/01 TD:  04/13/01 Job: [id number]

## 2010-08-22 NOTE — Cardiovascular Report (Signed)
[place]. [facility]  Patient:    [name], [name]             MRN: [id number] Proc. Date: 10/14/00 Adm. Date:  [id number] Disc. Date: [id number] Attending:  Berry, Jonathan [place] CC:         Peripheral Vascular Angiographic Suite  Silvano Bilis, M.D., [phone] [address], [place], [place] [address]  [facility], [address] [place], [place] [address]   Cardiac Catheterization  PROCEDURES PERFORMED:  Peripheral vascular angiogram.  INDICATIONS:  The patient is a 77 year old, married, Hispanic male, father of five children, with no prior cardiac history.  His risk factors include insulin-dependent diabetes.  He has complained of lower extremity claudication, right greater than left with ABIs performed at [facility] revealing a right ankle-brachial index of 0.35 and left of 0.7.  He presents now for peripheral angiography and potential intravascular therapy.  DESCRIPTION OF PROCEDURE:  The patient was brought to the sixth floor [place] Peripheral Vascular Angiographic Suite in the postabsorptive state.  He was premedicated with p.o. Valium.  His left groin was prepped and shaved in the usual sterile fashion.  Xylocaine 1% was used for local anesthesia.  A 5 French sheath was inserted into the left femoral artery using standard Seldinger technique.  A 5 French Tennis Racquet catheter was used for mid stream and distal abdominal aortography as well as bifemoral runoff. Visipaque dye was used for the entirety of the case.  Retrograde aortic pressures were monitored during the case.  ANGIOGRAPHIC RESULTS: 1. Abdominal aorta:    a. Renal artery - normal.    b. Infrarenal abdominal aorta - normal. 2. Left lower extremity:    a. A 60% segmental left SFA stenosis.    b. One-vessel runoff via the peroneal which had a 99% proximal stenosis. 3. Right lower extremity:    a. Total mid segment right SFA occlusion with  reconstitution of the distal       right SFA and Hunters canal by profunda femoris collaterals.    b. One-vessel runoff via a disease of the posterior tibialis.  IMPRESSION:  The patient has high-grade, severe right infrainguinal/infrapopiteal disease not amenable to percutaneous intervention. He has moderate disease on the left which I suspect will progress over time to become symmetric.  The only form of complete revascularization would be surgical for recent symptoms of claudication.  There is no evidence of chronic limb ischemia or threatened limb loss.  We withll recommend treatment wi Pletal to begin with along with exercise therapy.  Surgery does remain an option in the future.  The sheath was removed and pressure was held on the groin to achieve hemostasis.  The patient left the lab in stable condition.  He will be hydrated, and may recover for six hours after which he will be discharged home and will see me back in the office in 2-3 weeks for followup.  Dr. Avelino Leeds was notified of these results. DD:  10/14/00 TD:  10/14/00 Job: [id number]

## 2010-09-18 ENCOUNTER — Encounter (INDEPENDENT_AMBULATORY_CARE_PROVIDER_SITE_OTHER): Payer: Medicare Other

## 2010-09-18 DIAGNOSIS — I739 Peripheral vascular disease, unspecified: Secondary | ICD-10-CM

## 2010-09-18 DIAGNOSIS — Z48812 Encounter for surgical aftercare following surgery on the circulatory system: Secondary | ICD-10-CM

## 2010-09-23 NOTE — Procedures (Unsigned)
BYPASS GRAFT EVALUATION  INDICATION:  Follow up bypass graft.  HISTORY: Diabetes:  Yes. Cardiac:  No. Hypertension:  Yes. Smoking:  Previous. Previous Surgery:  Left femoral to popliteal bypass graft with saphenous vein, 02/20/2010.  SINGLE LEVEL ARTERIAL EXAM                              RIGHT              LEFT Brachial:                    158                Arteriovenous fistula Anterior tibial:             90                 111 Posterior tibial:            107                114 Peroneal: Ankle/brachial index:        0.68               0.72  PREVIOUS ABI:  Date: 06/09/2010  RIGHT:  0.62  LEFT:  0.71  LOWER EXTREMITY BYPASS GRAFT DUPLEX EXAM:  DUPLEX:  Patent left femoral to popliteal bypass graft with no area of increased velocity visualized within the body of the graft; however, elevated velocity within the distal external iliac artery at 2.71 m/s, suggestive of >50% stenosis.  IMPRESSION:  Stable ankle brachial indices.  Patent left femoral to popliteal bypass graft, as described above.  ___________________________________________ Quita Skye. Hart Rochester, M.D.  OD/MEDQ  D:  09/18/2010  T:  09/18/2010  Job:  [id number]

## 2011-01-05 LAB — POCT I-STAT 4, (NA,K, GLUC, HGB,HCT)
Glucose, Bld: 74
HCT: 33 — ABNORMAL LOW

## 2011-01-05 LAB — GLUCOSE, CAPILLARY

## 2011-01-05 LAB — APTT: aPTT: 29

## 2011-04-27 ENCOUNTER — Other Ambulatory Visit: Payer: Medicare Other

## 2011-04-28 ENCOUNTER — Other Ambulatory Visit (INDEPENDENT_AMBULATORY_CARE_PROVIDER_SITE_OTHER): Payer: Medicare Other | Admitting: *Deleted

## 2011-04-28 ENCOUNTER — Ambulatory Visit (INDEPENDENT_AMBULATORY_CARE_PROVIDER_SITE_OTHER): Payer: Medicare Other | Admitting: *Deleted

## 2011-04-28 DIAGNOSIS — Z48812 Encounter for surgical aftercare following surgery on the circulatory system: Secondary | ICD-10-CM

## 2011-04-28 DIAGNOSIS — I739 Peripheral vascular disease, unspecified: Secondary | ICD-10-CM

## 2011-04-28 DIAGNOSIS — I7092 Chronic total occlusion of artery of the extremities: Secondary | ICD-10-CM

## 2011-05-06 ENCOUNTER — Encounter: Payer: Self-pay | Admitting: Vascular Surgery

## 2011-05-06 NOTE — Procedures (Unsigned)
BYPASS GRAFT EVALUATION  INDICATION:  Followup bypass graft.  HISTORY: Diabetes:  Yes Cardiac:  No Hypertension:  Yes Smoking:  Previous Previous Surgery:  Left femoral to popliteal bypass graft with great saphenous vein performed 02/20/2010.  SINGLE LEVEL ARTERIAL EXAM                              RIGHT              LEFT Brachial:                    112                AVF Anterior tibial:             70                 68 Posterior tibial:            76                 95 Peroneal: Ankle/brachial index:        0.68               0.85  PREVIOUS ABI:  Date: 09/18/2010  RIGHT:  0.68  LEFT:  0.72  LOWER EXTREMITY BYPASS GRAFT DUPLEX EXAM:  DUPLEX:  Patent left femoral to popliteal bypass graft with no evidence of stenosis within the graft.  There was an elevated velocity noted at the distal external iliac artery of 294 cm/sec.  IMPRESSION: 1. Patent to left femoral popliteal bypass graft with velocity     measurements shown on the following worksheet. 2. Bilateral ankle brachial indices are suggestive of mild to moderate     arterial disease and are stable in comparison to the previous     study.  ___________________________________________ Quita Skye Hart Rochester, M.D.  EM/MEDQ  D:  04/28/2011  T:  04/28/2011  Job:  [id number]

## 2011-10-16 ENCOUNTER — Encounter: Payer: Self-pay | Admitting: Vascular Surgery

## 2011-11-02 ENCOUNTER — Encounter: Payer: Self-pay | Admitting: Neurosurgery

## 2011-11-03 ENCOUNTER — Ambulatory Visit (INDEPENDENT_AMBULATORY_CARE_PROVIDER_SITE_OTHER): Payer: Medicare Other | Admitting: Vascular Surgery

## 2011-11-03 ENCOUNTER — Encounter: Payer: Self-pay | Admitting: Neurosurgery

## 2011-11-03 ENCOUNTER — Ambulatory Visit (INDEPENDENT_AMBULATORY_CARE_PROVIDER_SITE_OTHER): Payer: Medicare Other | Admitting: Neurosurgery

## 2011-11-03 VITALS — BP 154/59 | HR 60 | Resp 16 | Ht 64.0 in | Wt 140.6 lb

## 2011-11-03 DIAGNOSIS — I739 Peripheral vascular disease, unspecified: Secondary | ICD-10-CM | POA: Insufficient documentation

## 2011-11-03 DIAGNOSIS — Z48812 Encounter for surgical aftercare following surgery on the circulatory system: Secondary | ICD-10-CM

## 2011-11-03 NOTE — Progress Notes (Signed)
[facility] [place] PAD/PVD Office Note  CC: Six-month for surveillance for PVD Referring Physician: Hart Rochester  History of Present Illness: 78 year old male patient of Dr. Hart Rochester who is status post left  femoral-popliteal saphenous vein graft I performed February 20 2010. The patient denies claudication, rest pain, abdominal stress in the lower extremities. The patient denies any new medical diagnoses or recent surgeries. Patient is seen with a relative and an interpreter.   Past Medical History  Diagnosis Date  . Diabetes mellitus   . Hypertension   . Hyperlipidemia   . Glaucoma   . Cataract     ROS: [x]  Positive   [ ]  Denies    General: [ ]  Weight loss, [ ]  Fever, [ ]  chills Neurologic: [x ] Dizziness, [ ]  Blackouts, [ ]  Seizure,(x) History of visual disturbance bilaterally [ ]  Stroke, [ ]  "Mini stroke", [ ]  Slurred speech, [ ]  Temporary blindness; [ ]  weakness in arms or legs, [ ]  Hoarseness Cardiac: [ ]  Chest pain/pressure, [ ]  Shortness of breath at rest [ ]  Shortness of breath with exertion, [ ]  Atrial fibrillation or irregular heartbeat Vascular: [x ] Pain in legs with walking, [ ]  Pain in legs at rest, [ ]  Pain in legs at night,  [ ]  Non-healing ulcer, [x ] Blood clot in vein/DVT,   Pulmonary: [ ]  Home oxygen, [ ]  Productive cough, [ ]  Coughing up blood, [ ]  Asthma,  [ ]  Wheezing Musculoskeletal:  [ ]  Arthritis, [ ]  Low back pain, [ ]  Joint pain Hematologic: [ ]  Easy Bruising, [ ]  Anemia; [ ]  Hepatitis Gastrointestinal: [ ]  Blood in stool, [ ]  Gastroesophageal Reflux/heartburn, [ ]  Trouble swallowing Urinary: [ ]  chronic Kidney disease, [ ]  on HD - [ ]  MWF or [ ]  TTHS, [ ]  Burning with urination, [ ]  Difficulty urinating Skin: [ ]  Rashes, [ ]  Wounds Psychological: [ ]  Anxiety, [ ]  Depression   Social History History  Substance Use Topics  . Smoking status: Former Games developer  . Smokeless tobacco: Former Neurosurgeon    Quit date: 04/07/1987  . Alcohol Use: No      Family History Family History  Problem Relation Age of Onset  . Heart disease Father   . Diabetes Father     No Known Allergies  Current Outpatient Prescriptions  Medication Sig Dispense Refill  . aspirin 81 MG tablet Take 81 mg by mouth daily.      . carvedilol (COREG) 3.125 MG tablet Take 3.125 mg by mouth 2 (two) times daily with a meal.      . clopidogrel (PLAVIX) 75 MG tablet Take 75 mg by mouth daily.      Marland Kitchen ezetimibe-simvastatin (VYTORIN) 10-40 MG per tablet Take 1 tablet by mouth at bedtime.      . ferrous gluconate (FERGON) 325 MG tablet Take 325 mg by mouth daily with breakfast.      . furosemide (LASIX) 40 MG tablet Take 40 mg by mouth daily.      . insulin aspart (NOVOLOG) 100 UNIT/ML injection Inject into the skin. As directed      . isosorbide dinitrate (ISORDIL) 20 MG tablet Take 20 mg by mouth 3 (three) times daily.      . multivitamin (RENA-VIT) TABS tablet Take 1 tablet by mouth daily.      . Omega-3 Fatty Acids (FISH OIL) 1000 MG CAPS Take 1,000 mg by mouth daily.        Physical Examination  Filed Vitals:  11/03/11 1011  BP: 154/59  Pulse: 60  Resp: 16    Body mass index is 24.13 kg/(m^2).  General:  WDWN in NAD Gait: Normal HEENT: WNL Eyes: Pupils equal Pulmonary: normal non-labored breathing , without Rales, rhonchi,  wheezing Cardiac: RRR, without  Murmurs, rubs or gallops; No carotid bruits Abdomen: soft, NT, no masses Skin: no rashes, ulcers noted Vascular Exam/Pulses: Palpable lower extremity pulses bilaterally, femoral pulses are 2+, no carotid bruits are heard  Extremities without ischemic changes, no Gangrene , no cellulitis; no open wounds;  Musculoskeletal: no muscle wasting or atrophy  Neurologic: A&O X 3; Appropriate Affect ; SENSATION: normal; MOTOR FUNCTION:  moving all extremities equally. Speech is fluent/normal  Non-Invasive Vascular Imaging: ABIs today are 0.54 and monophasic on the right, 0.65 and monophasic to biphasic  on the left, duplex graft scan shows the left lower extremity graft to be patent  ASSESSMENT/PLAN: The patient states he does have mild pain with ambulation which could be from various etiology, therefore he declines any further diagnostic intervention at this time. The patient will followup in 6 months with repeat ABIs and duplex graft scan. The patient's questions were encouraged and answered, he is in agreement with this plan.  Lauree Chandler ANP  Clinic M.D.: [place]

## 2011-11-16 NOTE — Procedures (Unsigned)
BYPASS GRAFT EVALUATION  INDICATION:  Peripheral vascular disease.  HISTORY: Diabetes:  Yes. Cardiac:  No. Hypertension:  Yes. Smoking:  Previous. Previous Surgery:  Left femoral to popliteal artery bypass graft 02/20/2010.  SINGLE LEVEL ARTERIAL EXAM                              RIGHT              LEFT Brachial: Anterior tibial: Posterior tibial: Peroneal: Ankle/brachial index:        0.54               0.65  PREVIOUS ABI:  Date:  04/28/2011  RIGHT:  0.68  LEFT:  0.85  LOWER EXTREMITY BYPASS GRAFT DUPLEX EXAM:  DUPLEX: 1. Elevated velocities suggestive of stenosis greater than 50%     involving the left distal external iliac and common femoral     arteries. 2. Elevated velocities present involving the left femoral to popliteal     artery proximal anastomosis suggesting 50-70% stenosis.  IMPRESSION: 1. Left native artery stenosis present as noted above. 2. Patent left femoral to popliteal artery bypass graft with proximal     anastomotic stenosis present. 3. Right ankle brachial index is in the severe claudication range. 4. Left ankle brachial index is in the moderate claudication range. 5. Decrease in the bilateral ankle brachial indices since previous     study on 04/28/2011.  ___________________________________________ Quita Skye. Hart Rochester, M.D.  SH/MEDQ  D:  11/03/2011  T:  11/03/2011  Job:  [id number]

## 2012-04-28 ENCOUNTER — Other Ambulatory Visit: Payer: Self-pay

## 2012-04-28 DIAGNOSIS — Z48812 Encounter for surgical aftercare following surgery on the circulatory system: Secondary | ICD-10-CM

## 2012-04-28 DIAGNOSIS — I739 Peripheral vascular disease, unspecified: Secondary | ICD-10-CM

## 2012-05-09 ENCOUNTER — Encounter: Payer: Self-pay | Admitting: Neurosurgery

## 2012-05-10 ENCOUNTER — Encounter (INDEPENDENT_AMBULATORY_CARE_PROVIDER_SITE_OTHER): Payer: Medicare Other | Admitting: *Deleted

## 2012-05-10 ENCOUNTER — Encounter: Payer: Self-pay | Admitting: Neurosurgery

## 2012-05-10 ENCOUNTER — Ambulatory Visit (INDEPENDENT_AMBULATORY_CARE_PROVIDER_SITE_OTHER): Payer: Medicare Other | Admitting: Neurosurgery

## 2012-05-10 VITALS — BP 165/54 | HR 54 | Resp 16 | Ht 62.0 in | Wt 147.0 lb

## 2012-05-10 DIAGNOSIS — I739 Peripheral vascular disease, unspecified: Secondary | ICD-10-CM

## 2012-05-10 DIAGNOSIS — Z48812 Encounter for surgical aftercare following surgery on the circulatory system: Secondary | ICD-10-CM

## 2012-05-10 NOTE — Progress Notes (Signed)
[facility] [place] PAD/PVD Office Note  CC: PAD surveillance Referring Physician: Hart Rochester  History of Present Illness: 79 year old male patient of Dr. Hart Rochester status post left femoropopliteal bypass in November 2011. The patient does have some mild claudication and rest pain however through his interpreter he states he is able to ambulate without life limiting pain in his rest pain is transient. The patient denies any open ulcerations on his lower extremities.  Past Medical History  Diagnosis Date  . Diabetes mellitus   . Hypertension   . Hyperlipidemia   . Glaucoma   . Cataract   . Peripheral vascular disease     ROS: [x]  Positive   [ ]  Denies    General: [ ]  Weight loss, [ ]  Fever, [ ]  chills Neurologic: [ ]  Dizziness, [ ]  Blackouts, [ ]  Seizure [ ]  Stroke, [ ]  "Mini stroke", [ ]  Slurred speech, [ ]  Temporary blindness; [ ]  weakness in arms or legs, [ ]  Hoarseness Cardiac: [ ]  Chest pain/pressure, [ ]  Shortness of breath at rest [ ]  Shortness of breath with exertion, [ ]  Atrial fibrillation or irregular heartbeat Vascular: [ ]  Pain in legs with walking, [ ]  Pain in legs at rest, [ ]  Pain in legs at night,  [ ]  Non-healing ulcer, [ ]  Blood clot in vein/DVT,   Pulmonary: [ ]  Home oxygen, [ ]  Productive cough, [ ]  Coughing up blood, [ ]  Asthma,  [ ]  Wheezing Musculoskeletal:  [ ]  Arthritis, [ ]  Low back pain, [ ]  Joint pain Hematologic: [ ]  Easy Bruising, [ ]  Anemia; [ ]  Hepatitis Gastrointestinal: [ ]  Blood in stool, [ ]  Gastroesophageal Reflux/heartburn, [ ]  Trouble swallowing Urinary: [ ]  chronic Kidney disease, [ ]  on HD - [ ]  MWF or [ ]  TTHS, [ ]  Burning with urination, [ ]  Difficulty urinating Skin: [ ]  Rashes, [ ]  Wounds Psychological: [ ]  Anxiety, [ ]  Depression   Social History History  Substance Use Topics  . Smoking status: Former Games developer  . Smokeless tobacco: Former Neurosurgeon    Quit date: 04/07/1987  . Alcohol Use: No    Family History Family  History  Problem Relation Age of Onset  . Heart disease Father   . Diabetes Father     No Known Allergies  Current Outpatient Prescriptions  Medication Sig Dispense Refill  . aspirin 81 MG tablet Take 81 mg by mouth daily.      . carvedilol (COREG) 3.125 MG tablet Take 3.125 mg by mouth 2 (two) times daily with a meal.      . clopidogrel (PLAVIX) 75 MG tablet Take 75 mg by mouth daily.      Marland Kitchen ezetimibe-simvastatin (VYTORIN) 10-40 MG per tablet Take 1 tablet by mouth at bedtime.      . ferrous gluconate (FERGON) 325 MG tablet Take 325 mg by mouth daily with breakfast.      . FOSRENOL 1000 MG chewable tablet daily.      . insulin aspart (NOVOLOG) 100 UNIT/ML injection Inject into the skin. As directed      . isosorbide mononitrate (IMDUR) 30 MG 24 hr tablet 15 mg Twice daily.      . multivitamin (RENA-VIT) TABS tablet Take 1 tablet by mouth daily.      Marland Kitchen NOVOLOG MIX 70/30 (70-30) 100 UNIT/ML injection 15 Units Twice daily.      . Omega-3 Fatty Acids (FISH OIL) 1000 MG CAPS Take 1,000 mg by mouth daily.      Marland Kitchen  VOLTAREN 1 % GEL as needed.      . furosemide (LASIX) 40 MG tablet Take 40 mg by mouth daily.      . isosorbide dinitrate (ISORDIL) 20 MG tablet Take 20 mg by mouth 3 (three) times daily.        Physical Examination  Filed Vitals:   05/10/12 1042  BP: 165/54  Pulse: 54  Resp: 16    Body mass index is 26.89 kg/(m^2).  General:  WDWN in NAD Gait: Normal HEENT: WNL Eyes: Pupils equal Pulmonary: normal non-labored breathing , without Rales, rhonchi,  wheezing Cardiac: RRR, without  Murmurs, rubs or gallops; No carotid bruits Abdomen: soft, NT, no masses Skin: no rashes, ulcers noted Vascular Exam/Pulses: 3+ radial pulses bilaterally, palpable femoral pulses bilaterally, lower extremity pulses are palpable  Extremities without ischemic changes, no Gangrene , no cellulitis; no open wounds;  Musculoskeletal: no muscle wasting or atrophy  Neurologic: A&O X 3; Appropriate  Affect ; SENSATION: normal; MOTOR FUNCTION:  moving all extremities equally. Speech is fluent/normal  Non-Invasive Vascular Imaging: Lower stream the arterial duplex today shows a left femoropopliteal with elevated velocities in the proximal anastomosis, there is noted a left external iliac artery velocity of 254 cm a second. ABIs are stable compared to previous exam  ASSESSMENT/PLAN: This is a patient with mild claudication and rest pain but declines any further diagnostic or intervention at this time. The patient will followup in one year with repeat duplex graft scan and ABIs. The patient's questions were encouraged and answered through his interpreter, he is in agreement with this plan.  Lauree Chandler ANP  Clinic M.D.: Hart Rochester

## 2012-05-11 ENCOUNTER — Other Ambulatory Visit: Payer: Self-pay | Admitting: *Deleted

## 2012-05-11 DIAGNOSIS — I739 Peripheral vascular disease, unspecified: Secondary | ICD-10-CM

## 2013-04-19 ENCOUNTER — Other Ambulatory Visit: Payer: Self-pay | Admitting: Neurosurgery

## 2013-04-19 DIAGNOSIS — I739 Peripheral vascular disease, unspecified: Secondary | ICD-10-CM

## 2013-04-19 DIAGNOSIS — Z48812 Encounter for surgical aftercare following surgery on the circulatory system: Secondary | ICD-10-CM

## 2013-05-12 ENCOUNTER — Encounter: Payer: Self-pay | Admitting: Family

## 2013-05-15 ENCOUNTER — Ambulatory Visit: Payer: Medicare Other | Admitting: Family

## 2013-05-15 ENCOUNTER — Ambulatory Visit (INDEPENDENT_AMBULATORY_CARE_PROVIDER_SITE_OTHER): Payer: Medicare Other | Admitting: Family

## 2013-05-15 ENCOUNTER — Ambulatory Visit (INDEPENDENT_AMBULATORY_CARE_PROVIDER_SITE_OTHER)
Admission: RE | Admit: 2013-05-15 | Discharge: 2013-05-15 | Disposition: A | Discharge status: Home / Self Care | Payer: Medicare Other | Source: Ambulatory Visit | Attending: Neurosurgery | Admitting: Neurosurgery

## 2013-05-15 ENCOUNTER — Encounter: Payer: Self-pay | Admitting: Family

## 2013-05-15 ENCOUNTER — Encounter (INDEPENDENT_AMBULATORY_CARE_PROVIDER_SITE_OTHER): Payer: Self-pay

## 2013-05-15 ENCOUNTER — Ambulatory Visit (HOSPITAL_COMMUNITY)
Admission: RE | Admit: 2013-05-15 | Discharge: 2013-05-15 | Disposition: A | Discharge status: Home / Self Care | Payer: Medicare Other | Source: Ambulatory Visit | Attending: Family | Admitting: Family

## 2013-05-15 VITALS — BP 137/48 | HR 69 | Resp 16 | Ht 63.5 in | Wt 152.0 lb

## 2013-05-15 DIAGNOSIS — Z48812 Encounter for surgical aftercare following surgery on the circulatory system: Secondary | ICD-10-CM

## 2013-05-15 DIAGNOSIS — I10 Essential (primary) hypertension: Secondary | ICD-10-CM | POA: Insufficient documentation

## 2013-05-15 DIAGNOSIS — E119 Type 2 diabetes mellitus without complications: Secondary | ICD-10-CM | POA: Insufficient documentation

## 2013-05-15 DIAGNOSIS — I739 Peripheral vascular disease, unspecified: Secondary | ICD-10-CM

## 2013-05-15 DIAGNOSIS — E785 Hyperlipidemia, unspecified: Secondary | ICD-10-CM | POA: Insufficient documentation

## 2013-05-15 DIAGNOSIS — Z87891 Personal history of nicotine dependence: Secondary | ICD-10-CM | POA: Insufficient documentation

## 2013-05-15 DIAGNOSIS — I70219 Atherosclerosis of native arteries of extremities with intermittent claudication, unspecified extremity: Secondary | ICD-10-CM | POA: Insufficient documentation

## 2013-05-15 NOTE — Progress Notes (Signed)
[facility] [place] HISTORY AND PHYSICAL -PAD  History of Present Illness [name] is a 80 y.o. male patient of Dr. Hart RochesterLawson status post left femoropopliteal bypass in November 2011. At his last visit, a year ago, he had mild claudication and rest pain but declined any further diagnostisc or intervention at that time. After walking about 50 feet, he has claudication in both calves, relieved by rest. His low back starts to hurt if he walks farther than 50 feet, states this may be related to falling out of a tree long ago. Dtr. reports recent increase of swelling in hands and legs. Daughter is translating to [place]. Has been on HD almost 6 years, left upper arm AV fistula inserted by Dr. Hart RochesterLawson in 2011. He denies non-healing ulcers, other than one spot on inner aspect of right 5th toe hurts, for about a week. Daughter denies that pt has ever had a stroke.  Having night time dyspnea, evaluation, echocardiogram, dtr. States was OK. The patient denies New Medical or Surgical History.  Pt Diabetic: Yes, not under control Pt smoker: former smoker, quit 30 years ago  Pt meds include: Statin :Yes Betablocker: Yes ASA: ran out about 2 months ago, dtr. States he will resume. Other anticoagulants/antiplatelets: Plavix  Past Medical History  Diagnosis Date  . Diabetes mellitus   . Hypertension   . Hyperlipidemia   . Glaucoma   . Cataract   . Peripheral vascular disease     Social History History  Substance Use Topics  . Smoking status: Former Games developermoker  . Smokeless tobacco: Former NeurosurgeonUser    Quit date: 04/07/1987  . Alcohol Use: No    Family History Family History  Problem Relation Age of Onset  . Heart disease Father   . Diabetes Father     Past Surgical History  Procedure Laterality Date  . Pr vein bypass graft,aorto-fem-pop  02/20/2010    left fem pop with saphenous vein  . Av fistula placement  10/24/2009    left    No Known  Allergies  Current Outpatient Prescriptions  Medication Sig Dispense Refill  . aspirin 81 MG tablet Take 81 mg by mouth daily.      . carvedilol (COREG) 3.125 MG tablet Take 3.125 mg by mouth 2 (two) times daily with a meal.      . clopidogrel (PLAVIX) 75 MG tablet Take 75 mg by mouth daily.      Marland Kitchen. ezetimibe-simvastatin (VYTORIN) 10-40 MG per tablet Take 1 tablet by mouth at bedtime.      . ferrous gluconate (FERGON) 325 MG tablet Take 325 mg by mouth daily with breakfast.      . FOSRENOL 1000 MG chewable tablet daily.      . furosemide (LASIX) 40 MG tablet Take 40 mg by mouth daily.      . insulin aspart (NOVOLOG) 100 UNIT/ML injection Inject into the skin. As directed      . isosorbide dinitrate (ISORDIL) 20 MG tablet Take 20 mg by mouth 3 (three) times daily.      . isosorbide mononitrate (IMDUR) 30 MG 24 hr tablet 15 mg Twice daily.      . multivitamin (RENA-VIT) TABS tablet Take 1 tablet by mouth daily.      Marland Kitchen. NOVOLOG MIX 70/30 (70-30) 100 UNIT/ML injection 15 Units Twice daily.      . Omega-3 Fatty Acids (FISH OIL) 1000 MG CAPS Take 1,000 mg by mouth daily.      . VOLTAREN 1 %  GEL as needed.       No current facility-administered medications for this visit.    ROS: See HPI for pertinent positives and negatives.   Physical Examination  Filed Vitals:   05/15/13 1524  BP: 137/48  Pulse: 69  Resp: 16   Filed Weights   05/15/13 1524  Weight: 152 lb (68.947 kg)   Body mass index is 26.5 kg/(m^2).  General: A&O x 3, WDWN. Gait: limp Eyes: PERRLA. Pulmonary: CTAB, without wheezes , rales or rhonchi. Cardiac: regular Rythm , without detected murmur.         Carotid Bruits Left Right   Positive Positive  Aorta is  palpable. Radial pulses: left is not palpable, right is 2+, right AV fistula has a palpable thrill.                           VASCULAR EXAM: Extremities without ischemic changes, has a hard small blister forming on inner aspect of right 5th toe, no open  wound, no drainage without Gangrene.                                                                                                          LE Pulses LEFT RIGHT       FEMORAL  palpable   palpable        POPLITEAL  not palpable   not palpable       POSTERIOR TIBIAL  not palpable   not palpable        DORSALIS PEDIS      ANTERIOR TIBIAL not palpable  not palpable    Abdomen: soft, NT, no masses. Skin: no rashes, no ulcers noted. Musculoskeletal: no muscle wasting or atrophy.  Neurologic: A&O X 3; Appropriate Affect ; SENSATION: normal; MOTOR FUNCTION:  moving all extremities equally, motor strength 5/5 throughout. Speech is fluent/normal. CN 2-12 intact.  Non-Invasive Vascular Imaging: DATE: 05/15/2013 ABI: RIGHT 0.58, Waveforms: monophasic;  LEFT 0.78, Waveforms: monophasic Previous (05/10/2012) ABI's: 0.55, Left: 0.60  DUPLEX SCAN OF BYPASS LOWER EXTREMITY ARTERIAL DUPLEX EVALUATION    INDICATION: Follow up bypass graft    PREVIOUS INTERVENTION(S): Left femoral to below knee bypass graft with non-reversed saphenous vein 02/20/2010 by Dr. Hart Rochester    DUPLEX EXAM: Left lower extremity duplex    RIGHT  LEFT   Peak Systolic Velocity (cm/s) Ratio (if abnormal) Waveform  Peak Systolic Velocity (cm/s) Ratio (if abnormal) Waveform     Inflow Artery 258  B     Proximal Anastomosis 205  B     Proximal Graft 80  B     Mid Graft 102  B      Distal Graft 118  B     Distal Anastomosis 133  B     Outflow Artery 91  B  0.58 Today's ABI / TBI 0.78  0.55 Previous ABI / TBI ( 05/10/12 ) 0.60    Waveform:    M - Monophasic       B - Biphasic  T - Triphasic  If Ankle Brachial Index (ABI) or Toe Brachial Index (TBI) performed, please see complete report     ADDITIONAL FINDINGS:     IMPRESSION: Patent left femoral to popliteal bypass graft with elevated velocity in the inflow artery.    Compared to the previous exam:  No significant change.    ASSESSMENT: [name] is a 80  y.o. male who presents s/p FPBPG on 02-20-2010, has moderate claudication symptoms, starting to be life limiting, and has a hard lesion forming inner aspect right 5th toe. ABI's indicate severe arterial occlusive disease in the right leg, and moderate in the left leg. Daughter states patient  was not averse to intervention in the past, it was interaction with personnel at a hospital that discouraged him from another hospitalization. Positive bilateral carotid bruits, daughter and patient do not recall [place] of neck in the past, will obtain when he returns in 6 months.  PLAN:  Will attempt conservative approach with graduated walking program, resume daily 81 mg ASA. Daughter indicates that patient has agreed in the past, and does not follow through.  Small band aid around right 5th toe, change daily, to cushion forming hard blister, hopefully will resolve without further irritation from 4th toe. I discussed in depth with the patient the nature of atherosclerosis, and emphasized the importance of maximal medical management including strict control of blood pressure, blood glucose, and lipid levels, obtaining regular exercise, and continued cessation of smoking.  The patient is aware that without maximal medical management the underlying atherosclerotic disease process will progress, limiting the benefit of any interventions.  Based on the patient's vascular studies and examination, pt will return to clinic in 6 months for ABI's, bilateral LE arterial Duplex, and carotid Duplex.  The patient was given information about PAD including signs, symptoms, treatment, what symptoms should prompt the patient to seek immediate medical care, and risk reduction measures to take.  Charisse March, RN, MSN, FNP-C [facility] Phone: [phone]  Clinic MD: Myra Gianotti  05/15/2013 3:00 PM

## 2013-05-15 NOTE — Patient Instructions (Signed)
Enfermedad vascular perifrica (Peripheral Vascular Disease) La enfermedad vascular perifrica, tambin denominada enfermedad arterial perifrica, es un problema circulatorio causado por el colesterol (placa arteriosclertica) que se deposita en las arterias. Generalmente se produce en las extremidades inferiores (piernas) pero tambin puede suceder en otras reas del cuerpo como por ejemplo en los brazos. La acumulacin de colesterol en las arterias reduce el flujo sanguneo lo que puede causar dolor y otros problemas graves. Esta enfermedad puede colocar a la persona en riesgo de sufrir una enfermedad arterial coronaria.  CAUSAS Las causas pueden ser Monsanto Companymuchas. Generalmente se asocia con ms de un factor de riesgo como:   Colesterol elevado  El consumo de cigarrillos.  Diabetes.  Falta de actividad fsica o inactividad.  Presin arterial elevada (hipertensin)  Conservation officer, naturebesidad  Historia familiar. SNTOMAS  Cuando estn afectadas las extremidades ConAgra Foodsinferiores los pacientes pueden experimentar:  Dolor en la pierna con el movimiento o la actividad fsica. Esto se denomina CLAUDICACIN INTERMITENTE. Puede estar presente como calambres o adormecimiento cuando se realiza actividad fsica. La ubicacin del dolor se asocia con el nivel de la obstruccin. Por ejemplo, obstruccin en el nivel abdominal (aorta abdominal distal) puede dar como resultado dolor en las nalgas o en la cadera Una obstruccin en la zona inferior de la pierna puede dar como resultado dolor en la pantorrilla.  A medida que se agrava, puede haber ms dolor con menos actividad fsica.  En las personas con enfermedad vascular perifrica grave, el dolor en la pierna puede ocurrir en estado de reposo.  Otros signos y sntomas son:  Siente adormecimiento o debilidad en la pierna.  La pierna o el pie afectados estn fros, especialmente cuando se lo compara con la otra pierna.  Cambio en el color de la pierna.  Los pacientes con un  trastorno significativo son ms propensos a las Engineer, waterlceras o llagas en los dedos de los pies, los pies y las piernas. Pueden demorar ms tiempo en curarse o puede haber una recurrencia. Las lceras o las llagas pueden infectarse.  Si los signos y los sntomas son ignorados, puede producirse una gangrena. Esto puede dar como resultado de la prdida de los dedos los pies o la prdida de todo el Cookmiembro.  No todo dolor en la pierna se relaciona con enfermedad vascular perifrica. Otros problemas mdicos pueden causar dolor en la pierna como:  Cogulos sanguneos (embolismo) o trombosis venosa profunda.  Inflamacin de los vasos sanguneos (vasculitis).  Estenosis espinal DIAGNSTICO El diagnstico puede involucrar varios tipos diferentes de Wabassoestudios. Estas pueden ser:  El registro de los valores del pulso (PVR). Es una prueba simple, indolora, y no implica el uso de rayos x. Implica la medicin y comparacin de la presin sangunea en los brazos y las piernas. Se calcula el ndice tobillo-brazo. La proporcin normal de esa relacin de presiones es 1. A medida que este nmero se achica, indica mayor gravedad en la enfermedad.  < 0.95 indica un estrechamiento significativo en uno o ms vasos sanguneos de la pierna.  <0.8 generalmente se acompaa de dolor en el pie, la pierna o la nalga con la actividad fsica.  <0.4 generalmente hay dolor en las piernas durante el reposo.  <0.25 generalmente indica amenaza de enfermedad vascular perifrica en el miembro.  Deteccin del pulso de las piernas con tcnica de Doppler. Esta prueba es indolora y permite controlar si hay pulso en las piernas y los pies.  Podrn aplicarle una tintura o material de contraste (una sustancia que destaca los vasos sanguneos para  que puedan verse en las radiografas) para que el mdico pueda ver las arterias United Parcelen los estudios. Esta sustancia se elimina del organismo por los riones. Su medico podr ordenar anlisis de sangre  para controlar la funcin renal y Tax inspectorotros valores de laboratorio antes de que se realicen las siguientes pruebas:  Angiografa por resonancia magntica. Es un estudio por imgenes de los vasos sanguneos y las arterias. Esta mquina utiliza un poderoso imn para producir imgenes de los vasos sanguneos.  Angiografa por tomografa computarizada. Es un tipo de radiografa especializada para observar como fluye la sangre en sus vasos sanguneos. Se inserta una va intravenosa en el brazo, de modo que pueda inyectarse la sustancia de [place]contraste.  Una angiografa es un procedimiento que utiliza rayos x para observar los vasos sanguneos. Este procedimiento es Murphy Oilmnimamente invasivo, y solo debe realizarse una pequea incisin (corte) en la ingle. Luego se inserta un pequeo tubo (catter) en la arteria de la ingle. El catter es guiado hasta el vaso sanguneo o arteria que el mdico quiera examinar. Se inyecta una sustancia de Engineer, structuralcontraste en el catter. Se toman las radiografas del vaso sanguneo o la arteria. Luego de que se obtienen las imgenes, el catter se Event organiserretira. TRATAMIENTO El tratamiento implica diferentes intervenciones que pueden incluir:  Modificaciones en el estilo de vida:  Abandonar el hbito de fumar .  Actividad fsica.  Una dieta con bajo contenido de grasas y colesterol.  Control de la diabetes.  El cuidado del pie es muy importante para el paciente que sufre esta enfermedad. El buen cuidado del pie puede evitar una infeccin.  Medicamentos:  Medicamentos para Stage managerbajar el nivel de colesterol.  Medicamentos para la presin arterial.  Drogas antiplaquetarias:  Ciertos medicamentos pueden reducir los sntomas de claudicacin intermitente.  Opciones quirrgicas/intervenciones:  Angioplastia La angioplastia es un procedimiento en el que se insufla un baln en la arteria obstruida. Esto abre la arteria y mejora el flujo sanguneo.  Implantacin de un stent. Una malla con forma de  tubo (stent) se coloca en la arteria. El stent se expande y [place] Medical laboratory scientific officeren el lugar, lo que permite a la arteria Facilities managerpermanecer abierta.  Ciruga de bypass perifrico. Este es un procedimiento quirrgico que redirecciona la sangre alrededor de la arteria obstruida para ayudar a mejorar el flujo sanguneo. Este tipo de procedimiento puede realizarse si la angioplastia o el stent no son Neomia Dearuna opcin posible. SOLICITE ATENCIN MDICA DE INMEDIATO SI:  Siente dolor o adormecimiento en los brazos o en las piernas.  El brazo o la pierna estn fros, se vuelven de Edison Internationalcolor azul.  Observa enrojecimiento, calor, hinchazn y American Standard Companiesdolor el los brazos o las piernas. ASEGURESE DE QUE:   Comprende estas instrucciones.  Controlar su enfermedad.  Solicitar ayuda inmediatamente si no mejora o si empeora. Document Released: 03/23/2005 Document Revised: 06/15/2011 [facility] Patient Information 2014 [place], [place].

## 2013-05-16 ENCOUNTER — Ambulatory Visit: Payer: Medicare Other | Admitting: Neurosurgery

## 2013-05-16 NOTE — Addendum Note (Signed)
Addended by: Adria DillELDRIDGE-LEWIS, Shayanne Gomm L on: 05/16/2013 10:15 AM   Modules accepted: Orders

## 2013-07-09 ENCOUNTER — Encounter (HOSPITAL_COMMUNITY): Payer: Self-pay | Admitting: Internal Medicine

## 2013-07-09 ENCOUNTER — Inpatient Hospital Stay (HOSPITAL_COMMUNITY): Payer: Medicare Other

## 2013-07-09 ENCOUNTER — Inpatient Hospital Stay (HOSPITAL_COMMUNITY)
Admission: AD | Admit: 2013-07-09 | Discharge: 2013-07-20 | DRG: 237 | Disposition: A | Discharge status: Rehabilitation Facility | Payer: Medicare Other | Source: Other Acute Inpatient Hospital | Attending: Internal Medicine | Admitting: Internal Medicine

## 2013-07-09 DIAGNOSIS — I5031 Acute diastolic (congestive) heart failure: Principal | ICD-10-CM | POA: Diagnosis present

## 2013-07-09 DIAGNOSIS — R1310 Dysphagia, unspecified: Secondary | ICD-10-CM | POA: Diagnosis present

## 2013-07-09 DIAGNOSIS — E785 Hyperlipidemia, unspecified: Secondary | ICD-10-CM | POA: Diagnosis present

## 2013-07-09 DIAGNOSIS — E1159 Type 2 diabetes mellitus with other circulatory complications: Secondary | ICD-10-CM

## 2013-07-09 DIAGNOSIS — Z48812 Encounter for surgical aftercare following surgery on the circulatory system: Secondary | ICD-10-CM

## 2013-07-09 DIAGNOSIS — Z7902 Long term (current) use of antithrombotics/antiplatelets: Secondary | ICD-10-CM

## 2013-07-09 DIAGNOSIS — Z8249 Family history of ischemic heart disease and other diseases of the circulatory system: Secondary | ICD-10-CM

## 2013-07-09 DIAGNOSIS — A0472 Enterocolitis due to Clostridium difficile, not specified as recurrent: Secondary | ICD-10-CM | POA: Diagnosis not present

## 2013-07-09 DIAGNOSIS — J811 Chronic pulmonary edema: Secondary | ICD-10-CM | POA: Diagnosis present

## 2013-07-09 DIAGNOSIS — I798 Other disorders of arteries, arterioles and capillaries in diseases classified elsewhere: Secondary | ICD-10-CM | POA: Diagnosis present

## 2013-07-09 DIAGNOSIS — IMO0002 Reserved for concepts with insufficient information to code with codable children: Secondary | ICD-10-CM

## 2013-07-09 DIAGNOSIS — Y832 Surgical operation with anastomosis, bypass or graft as the cause of abnormal reaction of the patient, or of later complication, without mention of misadventure at the time of the procedure: Secondary | ICD-10-CM | POA: Diagnosis not present

## 2013-07-09 DIAGNOSIS — H409 Unspecified glaucoma: Secondary | ICD-10-CM | POA: Diagnosis present

## 2013-07-09 DIAGNOSIS — E440 Moderate protein-calorie malnutrition: Secondary | ICD-10-CM | POA: Diagnosis present

## 2013-07-09 DIAGNOSIS — F03918 Unspecified dementia, unspecified severity, with other behavioral disturbance: Secondary | ICD-10-CM

## 2013-07-09 DIAGNOSIS — Z833 Family history of diabetes mellitus: Secondary | ICD-10-CM

## 2013-07-09 DIAGNOSIS — Z992 Dependence on renal dialysis: Secondary | ICD-10-CM

## 2013-07-09 DIAGNOSIS — I214 Non-ST elevation (NSTEMI) myocardial infarction: Secondary | ICD-10-CM

## 2013-07-09 DIAGNOSIS — Z79899 Other long term (current) drug therapy: Secondary | ICD-10-CM

## 2013-07-09 DIAGNOSIS — I2582 Chronic total occlusion of coronary artery: Secondary | ICD-10-CM | POA: Diagnosis present

## 2013-07-09 DIAGNOSIS — Z87891 Personal history of nicotine dependence: Secondary | ICD-10-CM

## 2013-07-09 DIAGNOSIS — I120 Hypertensive chronic kidney disease with stage 5 chronic kidney disease or end stage renal disease: Secondary | ICD-10-CM | POA: Diagnosis present

## 2013-07-09 DIAGNOSIS — E1142 Type 2 diabetes mellitus with diabetic polyneuropathy: Secondary | ICD-10-CM | POA: Diagnosis present

## 2013-07-09 DIAGNOSIS — I509 Heart failure, unspecified: Secondary | ICD-10-CM | POA: Diagnosis present

## 2013-07-09 DIAGNOSIS — R5381 Other malaise: Secondary | ICD-10-CM | POA: Diagnosis present

## 2013-07-09 DIAGNOSIS — I2510 Atherosclerotic heart disease of native coronary artery without angina pectoris: Secondary | ICD-10-CM | POA: Diagnosis present

## 2013-07-09 DIAGNOSIS — J9600 Acute respiratory failure, unspecified whether with hypoxia or hypercapnia: Secondary | ICD-10-CM

## 2013-07-09 DIAGNOSIS — F0391 Unspecified dementia with behavioral disturbance: Secondary | ICD-10-CM

## 2013-07-09 DIAGNOSIS — N186 End stage renal disease: Secondary | ICD-10-CM

## 2013-07-09 DIAGNOSIS — I10 Essential (primary) hypertension: Secondary | ICD-10-CM

## 2013-07-09 DIAGNOSIS — T82898A Other specified complication of vascular prosthetic devices, implants and grafts, initial encounter: Secondary | ICD-10-CM | POA: Diagnosis not present

## 2013-07-09 DIAGNOSIS — G9340 Encephalopathy, unspecified: Secondary | ICD-10-CM

## 2013-07-09 DIAGNOSIS — E1149 Type 2 diabetes mellitus with other diabetic neurological complication: Secondary | ICD-10-CM | POA: Diagnosis present

## 2013-07-09 DIAGNOSIS — F0390 Unspecified dementia without behavioral disturbance: Secondary | ICD-10-CM | POA: Diagnosis present

## 2013-07-09 DIAGNOSIS — I739 Peripheral vascular disease, unspecified: Secondary | ICD-10-CM

## 2013-07-09 DIAGNOSIS — Z7982 Long term (current) use of aspirin: Secondary | ICD-10-CM

## 2013-07-09 DIAGNOSIS — R404 Transient alteration of awareness: Secondary | ICD-10-CM | POA: Diagnosis present

## 2013-07-09 DIAGNOSIS — I2589 Other forms of chronic ischemic heart disease: Secondary | ICD-10-CM | POA: Diagnosis present

## 2013-07-09 DIAGNOSIS — Z794 Long term (current) use of insulin: Secondary | ICD-10-CM

## 2013-07-09 HISTORY — DX: End stage renal disease: N18.6

## 2013-07-09 HISTORY — DX: Dependence on renal dialysis: Z99.2

## 2013-07-09 LAB — POCT I-STAT 3, ART BLOOD GAS (G3+)
Acid-Base Excess: 2 mmol/L (ref 0.0–2.0)
Bicarbonate: 27 mEq/L — ABNORMAL HIGH (ref 20.0–24.0)
O2 Saturation: 94 %
PCO2 ART: 41.1 mmHg (ref 35.0–45.0)
PO2 ART: 68 mmHg — AB (ref 80.0–100.0)
Patient temperature: 98
TCO2: 28 mmol/L (ref 0–100)
pH, Arterial: 7.424 (ref 7.350–7.450)

## 2013-07-09 LAB — LACTIC ACID, PLASMA: Lactic Acid, Venous: 1.8 mmol/L (ref 0.5–2.2)

## 2013-07-09 LAB — GLUCOSE, CAPILLARY
GLUCOSE-CAPILLARY: 149 mg/dL — AB (ref 70–99)
GLUCOSE-CAPILLARY: 199 mg/dL — AB (ref 70–99)

## 2013-07-09 LAB — TROPONIN I

## 2013-07-09 LAB — PROCALCITONIN: PROCALCITONIN: 4.39 ng/mL

## 2013-07-09 LAB — TRIGLYCERIDES: TRIGLYCERIDES: 71 mg/dL (ref <150)

## 2013-07-09 LAB — MRSA PCR SCREENING: MRSA BY PCR: NEGATIVE

## 2013-07-09 MED ORDER — VANCOMYCIN HCL IN DEXTROSE 750-5 MG/150ML-% IV SOLN
750.0000 mg | INTRAVENOUS | Status: DC
  Filled 2013-07-09: qty 150

## 2013-07-09 MED ORDER — PIPERACILLIN-TAZOBACTAM IN DEX 2-0.25 GM/50ML IV SOLN
2.2500 g | Freq: Three times a day (TID) | INTRAVENOUS | Status: DC
  Administered 2013-07-10 – 2013-07-11 (×4): 2.25 g via INTRAVENOUS
  Filled 2013-07-09 (×7): qty 50

## 2013-07-09 MED ORDER — SODIUM CHLORIDE 0.9 % IV SOLN: 250.0000 mL | INTRAVENOUS | Status: DC | PRN

## 2013-07-09 MED ORDER — SODIUM CHLORIDE 0.9 % IV SOLN: INTRAVENOUS | Status: DC

## 2013-07-09 MED ORDER — ASPIRIN EC 81 MG PO TBEC
81.0000 mg | DELAYED_RELEASE_TABLET | Freq: Every day | ORAL | Status: DC
  Administered 2013-07-10: 81 mg via ORAL
  Filled 2013-07-09 (×5): qty 1

## 2013-07-09 MED ORDER — BIOTENE DRY MOUTH MT LIQD
15.0000 mL | Freq: Four times a day (QID) | OROMUCOSAL | Status: DC
  Administered 2013-07-10 – 2013-07-20 (×32): 15 mL via OROMUCOSAL

## 2013-07-09 MED ORDER — FENTANYL CITRATE 0.05 MG/ML IJ SOLN
100.0000 ug | Freq: Once | INTRAMUSCULAR | Status: AC
  Administered 2013-07-09: 100 ug via INTRAVENOUS

## 2013-07-09 MED ORDER — LANTHANUM CARBONATE 500 MG PO CHEW
1000.0000 mg | CHEWABLE_TABLET | Freq: Every day | ORAL | Status: DC
  Filled 2013-07-09 (×2): qty 2

## 2013-07-09 MED ORDER — FAMOTIDINE IN NACL 20-0.9 MG/50ML-% IV SOLN
20.0000 mg | Freq: Two times a day (BID) | INTRAVENOUS | Status: DC
  Administered 2013-07-09 – 2013-07-10 (×2): 20 mg via INTRAVENOUS
  Filled 2013-07-09 (×3): qty 50

## 2013-07-09 MED ORDER — VANCOMYCIN HCL IN DEXTROSE 1-5 GM/200ML-% IV SOLN: 1000.0000 mg | INTRAVENOUS | Status: DC

## 2013-07-09 MED ORDER — MIDAZOLAM HCL 2 MG/2ML IJ SOLN
INTRAMUSCULAR | Status: AC
  Administered 2013-07-09: 2 mg
  Filled 2013-07-09: qty 4

## 2013-07-09 MED ORDER — CHLORHEXIDINE GLUCONATE 0.12 % MT SOLN
15.0000 mL | Freq: Two times a day (BID) | OROMUCOSAL | Status: DC
  Administered 2013-07-09 – 2013-07-20 (×20): 15 mL via OROMUCOSAL
  Filled 2013-07-09 (×22): qty 15

## 2013-07-09 MED ORDER — PNEUMOCOCCAL VAC POLYVALENT 25 MCG/0.5ML IJ INJ
0.5000 mL | INJECTION | INTRAMUSCULAR | Status: AC
  Administered 2013-07-10: 0.5 mL via INTRAMUSCULAR
  Filled 2013-07-09: qty 0.5

## 2013-07-09 MED ORDER — FENTANYL CITRATE 0.05 MG/ML IJ SOLN
50.0000 ug | Freq: Once | INTRAMUSCULAR | Status: AC
  Administered 2013-07-09: 50 ug via INTRAVENOUS
  Filled 2013-07-09: qty 2

## 2013-07-09 MED ORDER — PROPOFOL 10 MG/ML IV EMUL
0.0000 ug/kg/min | INTRAVENOUS | Status: DC
  Administered 2013-07-09: 25 ug/kg/min via INTRAVENOUS
  Filled 2013-07-09: qty 100

## 2013-07-09 MED ORDER — MIDAZOLAM HCL 10 MG/2ML IJ SOLN: 2.0000 mg | Freq: Once | INTRAMUSCULAR | Status: AC

## 2013-07-09 MED ORDER — MIDAZOLAM HCL 2 MG/2ML IJ SOLN: 1.0000 mg | INTRAMUSCULAR | Status: DC | PRN

## 2013-07-09 MED ORDER — ASPIRIN 81 MG PO TABS: 81.0000 mg | ORAL_TABLET | Freq: Every day | ORAL | Status: DC

## 2013-07-09 MED ORDER — LANTHANUM CARBONATE 500 MG PO CHEW
1000.0000 mg | CHEWABLE_TABLET | Freq: Three times a day (TID) | ORAL | Status: DC
  Administered 2013-07-10 – 2013-07-13 (×7): 1000 mg
  Filled 2013-07-09 (×13): qty 2

## 2013-07-09 MED ORDER — ETOMIDATE 2 MG/ML IV SOLN
20.0000 mg | Freq: Once | INTRAVENOUS | Status: AC
  Administered 2013-07-09: 20 mg via INTRAVENOUS

## 2013-07-09 MED ORDER — FENTANYL CITRATE 0.05 MG/ML IJ SOLN
INTRAMUSCULAR | Status: AC
  Filled 2013-07-09: qty 4

## 2013-07-09 MED ORDER — FENTANYL BOLUS VIA INFUSION
25.0000 ug | INTRAVENOUS | Status: DC | PRN
  Filled 2013-07-09: qty 50

## 2013-07-09 MED ORDER — CLOPIDOGREL BISULFATE 75 MG PO TABS
75.0000 mg | ORAL_TABLET | Freq: Every day | ORAL | Status: DC
  Administered 2013-07-09 – 2013-07-12 (×4): 75 mg
  Filled 2013-07-09 (×5): qty 1

## 2013-07-09 MED ORDER — VANCOMYCIN HCL 10 G IV SOLR
1500.0000 mg | Freq: Once | INTRAVENOUS | Status: AC
  Administered 2013-07-09: 1500 mg via INTRAVENOUS
  Filled 2013-07-09 (×2): qty 1500

## 2013-07-09 MED ORDER — INSULIN ASPART 100 UNIT/ML ~~LOC~~ SOLN
2.0000 [IU] | SUBCUTANEOUS | Status: DC
  Administered 2013-07-09 – 2013-07-10 (×2): 4 [IU] via SUBCUTANEOUS
  Administered 2013-07-10 – 2013-07-11 (×3): 2 [IU] via SUBCUTANEOUS
  Administered 2013-07-11: 4 [IU] via SUBCUTANEOUS
  Administered 2013-07-12 (×2): 2 [IU] via SUBCUTANEOUS
  Administered 2013-07-12: 4 [IU] via SUBCUTANEOUS
  Administered 2013-07-12 – 2013-07-13 (×7): 2 [IU] via SUBCUTANEOUS
  Administered 2013-07-14: 4 [IU] via SUBCUTANEOUS
  Administered 2013-07-14: 2 [IU] via SUBCUTANEOUS
  Administered 2013-07-14: 6 [IU] via SUBCUTANEOUS
  Administered 2013-07-14: 4 [IU] via SUBCUTANEOUS
  Administered 2013-07-15: 6 [IU] via SUBCUTANEOUS
  Administered 2013-07-15 – 2013-07-16 (×6): 4 [IU] via SUBCUTANEOUS

## 2013-07-09 MED ORDER — CARVEDILOL 3.125 MG PO TABS
3.1250 mg | ORAL_TABLET | Freq: Two times a day (BID) | ORAL | Status: DC
  Administered 2013-07-09 – 2013-07-11 (×3): 3.125 mg
  Filled 2013-07-09 (×6): qty 1

## 2013-07-09 MED ORDER — HEPARIN SODIUM (PORCINE) 5000 UNIT/ML IJ SOLN
5000.0000 [IU] | Freq: Three times a day (TID) | INTRAMUSCULAR | Status: DC
  Administered 2013-07-09 – 2013-07-11 (×7): 5000 [IU] via SUBCUTANEOUS
  Filled 2013-07-09 (×8): qty 1

## 2013-07-09 MED ORDER — PIPERACILLIN-TAZOBACTAM 3.375 G IVPB 30 MIN
3.3750 g | Freq: Once | INTRAVENOUS | Status: AC
  Administered 2013-07-09: 3.375 g via INTRAVENOUS
  Filled 2013-07-09: qty 50

## 2013-07-09 MED ORDER — SODIUM CHLORIDE 0.9 % IV SOLN
0.0000 ug/h | INTRAVENOUS | Status: DC
  Administered 2013-07-09: 100 ug/h via INTRAVENOUS
  Filled 2013-07-09: qty 50

## 2013-07-09 MED ORDER — VANCOMYCIN HCL 10 G IV SOLR
2000.0000 mg | Freq: Once | INTRAVENOUS | Status: DC
  Filled 2013-07-09: qty 2000

## 2013-07-09 NOTE — Procedures (Signed)
Intubation Procedure Note Titus Mouldntonio Longenecker [id number] 12-24-1933  Procedure: Intubation Indications: Respiratory insufficiency  Procedure Details Consent: Risks of procedure as well as the alternatives and risks of each were explained to the (patient/caregiver).  Consent for procedure obtained. Time Out: Verified patient identification, verified procedure, site/side was marked, verified correct patient position, special equipment/implants available, medications/allergies/relevent history reviewed, required imaging and test results available.  Performed  MAC and 3 Medications:  Fentanyl 100 mcg Etomidate 20 mg Versed 2 mg NMB    Evaluation Hemodynamic Status: BP stable throughout; O2 sats: stable throughout Patient's Current Condition: stable Complications: No apparent complications Patient did tolerate procedure well. Chest X-ray ordered to verify placement.  CXR: pending.  Procedure by: N. Everardo BealsSharda MD  Brett CanalesSteve Navarre Diana ACNP [facility] PCCM Pager [phone] till 3 pm If no answer page [phone] 07/09/2013, 4:36 PM

## 2013-07-09 NOTE — Progress Notes (Signed)
I tried to call patient's daughter, Shanda BumpsJessica [phone]) at 6:50p to update her regarding her father's status. No answer. Will sign out to ICU Resident.  Stacy GardnerNeema Amaura Authier, MD (IMTS, PGY3)

## 2013-07-09 NOTE — Progress Notes (Signed)
ANTIBIOTIC CONSULT NOTE - INITIAL  Pharmacy Consult for vancomycin and zosyn Indication: HCAP  No Known Allergies  Patient Measurements: Height: 5\' 4"  (162.6 cm) Weight: 149 lb 11.1 oz (67.9 kg) IBW/kg (Calculated) : 59.2   Vital Signs: Temp: 98 F (36.7 C) (04/05 1530) Temp src: Axillary (04/05 1530) BP: 133/48 mmHg (04/05 1712) Pulse Rate: 56 (04/05 1712) Intake/Output from previous day:   Intake/Output from this shift:    Labs: No results found for this basename: WBC, HGB, PLT, LABCREA, CREATININE,  in the last 72 hours Estimated Creatinine Clearance: 7.3 ml/min (by C-G formula based on Cr of 7.09). No results found for this basename: VANCOTROUGH, Leodis BinetVANCOPEAK, VANCORANDOM, GENTTROUGH, GENTPEAK, GENTRANDOM, TOBRATROUGH, TOBRAPEAK, TOBRARND, AMIKACINPEAK, AMIKACINTROU, AMIKACIN,  in the last 72 hours   Microbiology: Recent Results (from the past 720 hour(s))  MRSA PCR SCREENING     Status: None   Collection Time    07/09/13  3:14 PM      Result Value Ref Range Status   MRSA by PCR NEGATIVE  NEGATIVE Final   Comment:            The GeneXpert MRSA Assay (FDA     approved for NASAL specimens     only), is one component of a     comprehensive MRSA colonization     surveillance program. It is not     intended to diagnose MRSA     infection nor to guide or     monitor treatment for     MRSA infections.    Medical History: Past Medical History  Diagnosis Date  . Diabetes mellitus   . Hypertension   . Hyperlipidemia   . Glaucoma   . Cataract   . Peripheral vascular disease   . ESRD on dialysis     Medications:  Prescriptions prior to admission  Medication Sig Dispense Refill  . aspirin 81 MG tablet Take 81 mg by mouth daily.      . carvedilol (COREG) 3.125 MG tablet Take 3.125 mg by mouth 2 (two) times daily with a meal.      . clopidogrel (PLAVIX) 75 MG tablet Take 75 mg by mouth daily.      Marland Kitchen. ezetimibe-simvastatin (VYTORIN) 10-40 MG per tablet Take 1 tablet  by mouth at bedtime.      . ferrous gluconate (FERGON) 325 MG tablet Take 325 mg by mouth daily with breakfast.      . FOSRENOL 1000 MG chewable tablet daily.      . furosemide (LASIX) 40 MG tablet Take 40 mg by mouth daily.      . insulin aspart (NOVOLOG) 100 UNIT/ML injection Inject into the skin. As directed      . isosorbide dinitrate (ISORDIL) 20 MG tablet Take 20 mg by mouth 3 (three) times daily.      . isosorbide mononitrate (IMDUR) 30 MG 24 hr tablet 15 mg Twice daily.      Marland Kitchen. levofloxacin (LEVAQUIN) 750 MG tablet       . multivitamin (RENA-VIT) TABS tablet Take 1 tablet by mouth daily.      Marland Kitchen. NOVOLOG MIX 70/30 (70-30) 100 UNIT/ML injection 15 Units Twice daily.      . Omega-3 Fatty Acids (FISH OIL) 1000 MG CAPS Take 1,000 mg by mouth daily.      . VOLTAREN 1 % GEL as needed.       Assessment: 80 [facility]osyn [facility] HCAP.  Per  RN he had not gotten any abx at [facility] and his usual HD days are MWF but he will have a HD today for pulmonary edema. Wt 67.9 kg. ESRD. PMH: PVD, ESRD, DM. Per [facility] CXR: B/L pulmonary edema.   Pt intubated.   4/5 BC x2 4/5 trach asp  vanc 4/5>> Zosyn 4/5>>  Goal of Therapy:  Pre-HD vanc level 15 - 25 mcg/ml  Plan:  1. Zosyn 3.375 gm IV x 1 dose over 30 minutes now, then zosyn 2.25 gm IV q8h 2. Vancomycin 1500 mg after HD today or during last hour of HD today 3. Vancomycin 750 mg after each HD on MWF 4. F/u culture data, temp, wbc 5. Pre-HD vanc levels as needed Herby Abraham, Pharm.D. [phone] 07/09/2013 7:05 PM

## 2013-07-09 NOTE — Procedures (Signed)
Personally supervised procedure   Dr. Kalman ShanMurali Clella Mckeel, M.D., [facility] LL[facility].C.C.P Pulmonary and Critical Care Medicine Staff Physician [facility] Pager: [phone], If no answer or between  15:00h - 7:00h: call [phone]  07/09/2013 6:43 PM

## 2013-07-09 NOTE — H&P (Signed)
PULMONARY / CRITICAL CARE MEDICINE   Name: [name] MRN: [id number] DOB: 06/27/1933    ADMISSION DATE:  07/09/2013 CONSULTATION DATE:  07/09/13  REFERRING MD :  [facility] ED PRIMARY SERVICE: PCCM  CHIEF COMPLAINT:  Acute SOB  HPI:  Patient is a 80 yo man with acute onset SOB with history of ESRD on HD (MWF), PVD (s/p left fempop) & DM on insulin.  Patient mumbles responses to questions, minimally responsive. Per [facility] chart review, patient has been without fever and cough.   On discussion with patient's friend Alex Gardener, [phone]), patient called Verdis Frederickson this morning complaining of abdominal pain. When Verdis Frederickson arrived at patient's house, he appeared SOB and they left [facility].  He had a similar episode of SOB in December at [facility], told it was not HF, and thought there was something wrong with his lungs - cannot offer further details. Verdis Frederickson also notes that patient is constantly sleepy since Dec.  He has had similar SOB prior to Dec, thought to be due to heart failure.   To note, patient lives alone. He has 5 children, but only speaks with 2, a daughter in [place], [phone] - primary contact) and son in [place].    SIGNIFICANT EVENTS / STUDIES:  07/09/13 [facility]: B/l pulm edema (CD with image in chart)  LINES / TUBES: ETT 07/09/13 >>>  CULTURES: 07/09/13 Blood Cx x2 >>> 07/09/13 Trach Aspiriate >>> 07/09/13 Resp Virus PCR Panel >>  ANTIBIOTICS: Vanc 4/5 >>> Zosyn 4/5 >>>   PAST MEDICAL HISTORY :  Past Medical History  Diagnosis Date  . Diabetes mellitus   . Hypertension   . Hyperlipidemia   . Glaucoma   . Cataract   . Peripheral vascular disease   . ESRD on dialysis    Past Surgical History  Procedure Laterality Date  . Pr vein bypass graft,aorto-fem-pop  02/20/2010    left fem pop with saphenous vein  . Av fistula placement  10/24/2009    left   Prior to Admission medications   Medication Sig Start Date End  Date Taking? Authorizing Provider  aspirin 81 MG tablet Take 81 mg by mouth daily.    Historical Provider, MD  carvedilol (COREG) 3.125 MG tablet Take 3.125 mg by mouth 2 (two) times daily with a meal.    Historical Provider, MD  clopidogrel (PLAVIX) 75 MG tablet Take 75 mg by mouth daily.    Historical Provider, MD  ezetimibe-simvastatin (VYTORIN) 10-40 MG per tablet Take 1 tablet by mouth at bedtime.    Historical Provider, MD  ferrous gluconate (FERGON) 325 MG tablet Take 325 mg by mouth daily with breakfast.    Historical Provider, MD  FOSRENOL 1000 MG chewable tablet daily. 04/26/12   Historical Provider, MD  furosemide (LASIX) 40 MG tablet Take 40 mg by mouth daily.    Historical Provider, MD  insulin aspart (NOVOLOG) 100 UNIT/ML injection Inject into the skin. As directed    Historical Provider, MD  isosorbide dinitrate (ISORDIL) 20 MG tablet Take 20 mg by mouth 3 (three) times daily.    Historical Provider, MD  isosorbide mononitrate (IMDUR) 30 MG 24 hr tablet 15 mg Twice daily. 04/18/12   Historical Provider, MD  multivitamin (RENA-VIT) TABS tablet Take 1 tablet by mouth daily.    Historical Provider, MD  NOVOLOG MIX 70/30 (70-30) 100 UNIT/ML injection 15 Units Twice daily. 04/21/12   Historical Provider, MD  Omega-3 Fatty Acids (FISH OIL) 1000 MG CAPS Take 1,000 mg  by mouth daily.    Historical Provider, MD  VOLTAREN 1 % GEL as needed. 04/18/12   Historical Provider, MD   No Known Allergies  FAMILY HISTORY:  Family History  Problem Relation Age of Onset  . Heart disease Father     Heart Disease before age 56  . Diabetes Father   . Hyperlipidemia Father   . Hypertension Father   . Hyperlipidemia Mother   . Hypertension Mother   . Heart disease Mother     Heart Disease before age 49  . Diabetes Brother   . Hyperlipidemia Brother   . Hypertension Brother    SOCIAL HISTORY:  reports that he quit smoking about 37 years ago. He quit smokeless tobacco use about 26 years ago. He  reports that he does not drink alcohol or use illicit drugs.  REVIEW OF SYSTEMS:  Unable to obtain due to AMS  SUBJECTIVE: lethargic, mumbles responses, denies pain  VITAL SIGNS: Temp:  [98 F (36.7 C)] 98 F (36.7 C) (04/05 1530) Pulse Rate:  [66] 66 (04/05 1530) Resp:  [17] 17 (04/05 1530) SpO2:  [96 %] 96 % (04/05 1530) FiO2 (%):  [40 %] 40 % (04/05 1506) Weight:  [149 lb 11.1 oz (67.9 kg)] 149 lb 11.1 oz (67.9 kg) (04/05 1506) HEMODYNAMICS:   VENTILATOR SETTINGS: Vent Mode:  [-] BIPAP FiO2 (%):  [40 %] 40 % Set Rate:  [15 bmp] 15 bmp Pressure Support:  [8 cmH20] 8 cmH20 INTAKE / OUTPUT: Intake/Output   None     PHYSICAL EXAMINATION: General: resting in bed HEENT: PERRL (sluggish), EOMI, no scleral icterus, dentures in place Cardiac: RRR, no rubs, murmurs or gallops Pulm: decreased air mvmt bilaterally with coarse upper airway noise Abd: soft, nontender, nondistended, BS normoactive Ext: warm and well perfused, no pedal edema, LUE dialysis access with +thrill Neuro: awake but lethargic with minimal response to pain   LABS: Labs from [facility] in Chart WBC 15.6, Hb 11.8, Platelets 322 Na 142, K 4.5, Cl 95, CO2 28, BUN 45, Cr 9.8, Glu 149 AST 18, ALT 13, Alk Phos 106 CK 97, Myoglobin 238, Trop <0.01, pBNP >35000 TP 8.2, alb 4.7  PH 7.34, pCO2 51, pO2 168 on BiPap  Glucose  Recent Labs Lab 07/09/13 1509  GLUCAP 149*   ABG: pH 7.42, CO2 41, O2 68  Imaging CXR: b/l pulm edema (outside hospital)  EKG: regular, sinus, HR 98, QTc 398    LABS AT [facility] - UPDATED bY STAFF MD PULMONARY  Recent Labs Lab 07/09/13 1611  PHART 7.424  PCO2ART 41.1  PO2ART 68.0*  HCO3 27.0*  TCO2 28  O2SAT 94.0    CBC No results found for this basename: HGB, HCT, WBC, PLT,  in the last 168 hours  COAGULATION No results found for this basename: INR,  in the last 168 hours  CARDIAC  No results found for this basename: TROPONINI,  in the last 168 hours No  results found for this basename: PROBNP,  in the last 168 hours   CHEMISTRY No results found for this basename: NA, K, CL, CO2, GLUCOSE, BUN, CREATININE, CALCIUM, MG, PHOS,  in the last 168 hours Estimated Creatinine Clearance: 7.3 ml/min (by C-G formula based on Cr of 7.09).   LIVER No results found for this basename: AST, ALT, ALKPHOS, BILITOT, PROT, ALBUMIN, INR,  in the last 168 hours   INFECTIOUS No results found for this basename: LATICACIDVEN, PROCALCITON,  in the last 168 hours   ENDOCRINE CBG (  last 3)   Recent Labs  07/09/13 1509  GLUCAP 149*         IMAGING x48h  Dg Chest Port 1 View  07/09/2013   CLINICAL DATA:  Hypoxia  EXAM: PORTABLE CHEST - 1 VIEW  COMPARISON:  None.  FINDINGS: Endotracheal tube tip is at the carina. No pneumothorax. There is cardiomegaly with mild interstitial edema. There is consolidation in both bases. There is pulmonary venous hypertension. No adenopathy.  IMPRESSION: Endotracheal tube tip is at the carina. Advise withdrawing endotracheal tube approximately 3 cm. There is underlying congestive heart failure. There is airspace consolidation in both bases. This opacity may represent alveolar edema. Superimposed pneumonia, however, cannot be excluded. Congestive heart failure and pneumonia may exist concurrently.   Electronically Signed   By: Lowella Grip M.D.   On: 07/09/2013 17:06      ASSESSMENT / PLAN:  PULMONARY A: Acute Hypoxemic Respiratory failure with pulm edema in setting of ESRD, initially on BiPap, intubated for airway protection; possible healthcare associated PNA; though acute, less likely PE given history/CXR findings (Wells criteria 0) P:   -Vent Support -HD for fluid mgmt -check procalcitonin   CARDIOVASCULAR A: r/o ACS, h/o HTN, PVD; hemodynamically stable P:  -Cont ASA, Plavix, Coreg per tube -CE x 3 -AM EKG -2D echo  RENAL A:  ESRD on HD P:   -HD MWF, renal consulted, appreciate input -cont Fosrenal per  tube  GASTROINTESTINAL A:  No acute issues P:   -Famotidine - GI ppx  HEMATOLOGIC A:  No acute issues P:  -Monitor prn  INFECTIOUS A:  Concern for Healthcare Associated PNA on CXR P:   -Follow blood/tracheal aspirate cultures -Vanc/Zosyn  ENDOCRINE A:  DM  P:   -ICU Hyperglycemia protocol  NEUROLOGIC A:  Acute Encephalopathy - uremia vs benzo (?lorazepam at [facility]), not hypercarbic P:   Dc fent and versed started but start diprivan started by resident/NP (staff MD note) -HD     GLOBAL 07/09/13: Family friend Emergency contact Verdis Frederickson A updated by Dr Burnard Bunting (5 kids but ? Estranged from them all)  Fransisca Kaufmann, MD (IMTS, PGY3) [place]    STAFF NOTE: I, Dr Ann Lions have personally reviewed patient's available data, including medical history, events of note, physical examination and test results as part of my evaluation. I have discussed with resident/NP and other care providers such as pharmacist, RN and RRT.  In addition,  I personally evaluated patient and elicited key findings of  Acute respiratory failure due to acute pulmonary edema 2 days after regular dialysis. Unclear why, will rule out resp infection and NSTEMI. Will get renal for HD today. He got intubated because he was very somnolent and not sure why till later reports that he might have gotten benzo. Aim to extubate after HD. Family updated by resident.  Rest per NP/medical resident whose note is outlined above and that I agree with  The patient is critically ill with multiple organ systems failure and requires high complexity decision making for assessment and support, frequent evaluation and titration of therapies, application of advanced monitoring technologies and extensive interpretation of multiple databases.   Critical Care Time devoted to patient care services described in this note is  45  Minutes. indepdent of teaching time and procedure time  Dr. Brand Males, M.D., [facility].C.P Pulmonary and  Critical Care Medicine Staff Physician [place] Pulmonary and Critical Care Pager: [phone], If no answer or between  15:00h - 7:00h: call [phone]  07/09/2013 6:47 PM

## 2013-07-10 ENCOUNTER — Inpatient Hospital Stay (HOSPITAL_COMMUNITY): Payer: Medicare Other

## 2013-07-10 DIAGNOSIS — I517 Cardiomegaly: Secondary | ICD-10-CM

## 2013-07-10 DIAGNOSIS — Z48812 Encounter for surgical aftercare following surgery on the circulatory system: Secondary | ICD-10-CM

## 2013-07-10 LAB — GLUCOSE, CAPILLARY
GLUCOSE-CAPILLARY: 116 mg/dL — AB (ref 70–99)
GLUCOSE-CAPILLARY: 143 mg/dL — AB (ref 70–99)
Glucose-Capillary: 62 mg/dL — ABNORMAL LOW (ref 70–99)
Glucose-Capillary: 47 mg/dL — ABNORMAL LOW (ref 70–99)
Glucose-Capillary: 97 mg/dL (ref 70–99)
Glucose-Capillary: 173 mg/dL — ABNORMAL HIGH (ref 70–99)
Glucose-Capillary: 95 mg/dL (ref 70–99)

## 2013-07-10 LAB — BASIC METABOLIC PANEL
BUN: 63 mg/dL — ABNORMAL HIGH (ref 6–23)
CALCIUM: 8.4 mg/dL (ref 8.4–10.5)
CO2: 24 mEq/L (ref 19–32)
CREATININE: 10.32 mg/dL — AB (ref 0.50–1.35)
Chloride: 99 mEq/L (ref 96–112)
GFR calc Af Amer: 5 mL/min — ABNORMAL LOW (ref >90)
GFR calc non Af Amer: 4 mL/min — ABNORMAL LOW (ref >90)
Glucose, Bld: 44 mg/dL — CL (ref 70–99)
Potassium: 5.3 mEq/L (ref 3.7–5.3)
Sodium: 143 mEq/L (ref 137–147)

## 2013-07-10 LAB — PRO B NATRIURETIC PEPTIDE: PRO B NATRI PEPTIDE: 54473 pg/mL — AB (ref 0–450)

## 2013-07-10 LAB — CBC
HCT: 28.1 % — ABNORMAL LOW (ref 39.0–52.0)
Hemoglobin: 9.2 g/dL — ABNORMAL LOW (ref 13.0–17.0)
MCH: 32.1 pg (ref 26.0–34.0)
MCHC: 32.7 g/dL (ref 30.0–36.0)
MCV: 97.9 fL (ref 78.0–100.0)
Platelets: 221 10*3/uL (ref 150–400)
RBC: 2.87 MIL/uL — ABNORMAL LOW (ref 4.22–5.81)
RDW: 14.7 % (ref 11.5–15.5)
WBC: 12.8 10*3/uL — AB (ref 4.0–10.5)

## 2013-07-10 LAB — HEMOGLOBIN A1C
HEMOGLOBIN A1C: 5.3 % (ref <5.7)
MEAN PLASMA GLUCOSE: 105 mg/dL (ref <117)

## 2013-07-10 LAB — PHOSPHORUS
PHOSPHORUS: 2.7 mg/dL (ref 2.3–4.6)
Phosphorus: 3.4 mg/dL (ref 2.3–4.6)

## 2013-07-10 LAB — MAGNESIUM
Magnesium: 2.4 mg/dL (ref 1.5–2.5)
Magnesium: 2.5 mg/dL (ref 1.5–2.5)

## 2013-07-10 LAB — TROPONIN I
Troponin I: <0.3 ng/mL (ref <0.30)
Troponin I: <0.3 ng/mL (ref <0.30)

## 2013-07-10 LAB — LIPASE, BLOOD: Lipase: 24 U/L (ref 11–59)

## 2013-07-10 MED ORDER — FAMOTIDINE IN NACL 20-0.9 MG/50ML-% IV SOLN
20.0000 mg | Freq: Every day | INTRAVENOUS | Status: DC
  Administered 2013-07-11 – 2013-07-13 (×3): 20 mg via INTRAVENOUS
  Filled 2013-07-10 (×3): qty 50

## 2013-07-10 MED ORDER — DEXTROSE 50 % IV SOLN: 50.0000 mL | INTRAVENOUS | Status: DC | PRN

## 2013-07-10 MED ORDER — DARBEPOETIN ALFA-POLYSORBATE 40 MCG/0.4ML IJ SOLN
40.0000 ug | INTRAMUSCULAR | Status: DC
  Administered 2013-07-10: 40 ug via INTRAVENOUS
  Filled 2013-07-10 (×2): qty 0.4

## 2013-07-10 MED ORDER — DEXTROSE 50 % IV SOLN
INTRAVENOUS | Status: AC
Start: 2013-07-10 — End: 2013-07-10
  Filled 2013-07-10: qty 50

## 2013-07-10 MED ORDER — FENTANYL CITRATE 0.05 MG/ML IJ SOLN
0.0000 ug/h | INTRAMUSCULAR | Status: DC
  Administered 2013-07-10: 200 ug/h via INTRAVENOUS
  Administered 2013-07-10: 50 ug/h via INTRAVENOUS
  Administered 2013-07-12: 75 ug/h via INTRAVENOUS
  Administered 2013-07-13 (×2): 100 ug/h via INTRAVENOUS
  Filled 2013-07-10 (×4): qty 50

## 2013-07-10 MED ORDER — NEPRO/CARBSTEADY PO LIQD
1000.0000 mL | ORAL | Status: DC
  Administered 2013-07-10 – 2013-07-14 (×4): 1000 mL
  Filled 2013-07-10 (×8): qty 1000

## 2013-07-10 MED ORDER — PRO-STAT SUGAR FREE PO LIQD
30.0000 mL | Freq: Two times a day (BID) | ORAL | Status: DC
  Administered 2013-07-10 – 2013-07-15 (×9): 30 mL
  Filled 2013-07-10 (×13): qty 30

## 2013-07-10 MED ORDER — FENTANYL CITRATE 0.05 MG/ML IJ SOLN
25.0000 ug | INTRAMUSCULAR | Status: DC | PRN
  Administered 2013-07-10: 100 ug via INTRAVENOUS
  Filled 2013-07-10: qty 2

## 2013-07-10 MED ORDER — SODIUM CHLORIDE 0.9 % IV SOLN
125.0000 mg | INTRAVENOUS | Status: DC
  Administered 2013-07-10 – 2013-07-14 (×3): 125 mg via INTRAVENOUS
  Filled 2013-07-10 (×7): qty 10

## 2013-07-10 MED ORDER — FENTANYL CITRATE 0.05 MG/ML IJ SOLN
INTRAMUSCULAR | Status: AC
  Filled 2013-07-10: qty 2

## 2013-07-10 MED ORDER — VANCOMYCIN HCL IN DEXTROSE 750-5 MG/150ML-% IV SOLN
750.0000 mg | INTRAVENOUS | Status: DC
  Filled 2013-07-10: qty 150

## 2013-07-10 MED ORDER — VITAL HIGH PROTEIN PO LIQD
1000.0000 mL | ORAL | Status: DC
  Filled 2013-07-10 (×2): qty 1000

## 2013-07-10 NOTE — Consult Note (Signed)
Renal Service Consult Note [facility]  [place] 07/10/2013 [name] D Requesting Physician:  Dr Tyson Alias  Reason for Consult:  ESRD pt with resp failure and pulm edema HPI: The patient is a 80 y.o. year-old with hx of HTN, DM, PAD and ESRD on HD starting since around March 2010.  Patient presented to [facility] with SOB and abd pain.  CXR showed diffuse bilat pulm edema.  Pt intubated and transferred to [facility].  Pt is on vent, not able to answer many questions but responds appropriately to commands  Chart Review 2003 > arteriogram LE's with perc procedure for LLE ischemia by Dr Allyson Sabal  2009 > RUA AV fistula 06/2008  R IJ Diatek for HD 08/2009 > ligation RUA AVF and new RFA AVG 02/2010 > ESRD, left fempop BPG, DM, HTN  ROS  on vent, [place]  Past Medical History  Past Medical History  Diagnosis Date  . Diabetes mellitus   . Hypertension   . Hyperlipidemia   . Glaucoma   . Cataract   . Peripheral vascular disease   . ESRD on dialysis    Past Surgical History  Past Surgical History  Procedure Laterality Date  . Pr vein bypass graft,aorto-fem-pop  02/20/2010    left fem pop with saphenous vein  . Av fistula placement  10/24/2009    left   Family History  Family History  Problem Relation Age of Onset  . Heart disease Father     Heart Disease before age 51  . Diabetes Father   . Hyperlipidemia Father   . Hypertension Father   . Hyperlipidemia Mother   . Hypertension Mother   . Heart disease Mother     Heart Disease before age 20  . Diabetes Brother   . Hyperlipidemia Brother   . Hypertension Brother    Social History  reports that he quit smoking about 37 years ago. He quit smokeless tobacco use about 26 years ago. He reports that he does not drink alcohol or use illicit drugs. Allergies No Known Allergies Home medications Prior to Admission medications   Medication Sig Start Date End Date Taking? Authorizing Provider  aspirin 81 MG  tablet Take 81 mg by mouth daily.    Historical Provider, MD  carvedilol (COREG) 3.125 MG tablet Take 3.125 mg by mouth 2 (two) times daily with a meal.    Historical Provider, MD  clopidogrel (PLAVIX) 75 MG tablet Take 75 mg by mouth daily.    Historical Provider, MD  ezetimibe-simvastatin (VYTORIN) 10-40 MG per tablet Take 1 tablet by mouth at bedtime.    Historical Provider, MD  ferrous gluconate (FERGON) 325 MG tablet Take 325 mg by mouth daily with breakfast.    Historical Provider, MD  FOSRENOL 1000 MG chewable tablet daily. 04/26/12   Historical Provider, MD  furosemide (LASIX) 40 MG tablet Take 40 mg by mouth daily.    Historical Provider, MD  insulin aspart (NOVOLOG) 100 UNIT/ML injection Inject into the skin. As directed    Historical Provider, MD  isosorbide dinitrate (ISORDIL) 20 MG tablet Take 20 mg by mouth 3 (three) times daily.    Historical Provider, MD  isosorbide mononitrate (IMDUR) 30 MG 24 hr tablet 15 mg Twice daily. 04/18/12   Historical Provider, MD  levofloxacin (LEVAQUIN) 750 MG tablet  05/04/13   Historical Provider, MD  multivitamin (RENA-VIT) TABS tablet Take 1 tablet by mouth daily.    Historical Provider, MD  NOVOLOG MIX 70/30 (70-30) 100 UNIT/ML injection 15  Units Twice daily. 04/21/12   Historical Provider, MD  Omega-3 Fatty Acids (FISH OIL) 1000 MG CAPS Take 1,000 mg by mouth daily.    Historical Provider, MD  VOLTAREN 1 % GEL as needed. 04/18/12   Historical Provider, MD   Liver Function Tests No results found for this basename: AST, ALT, ALKPHOS, BILITOT, PROT, ALBUMIN,  in the last 168 hours  Recent Labs Lab 07/10/13 0525  LIPASE 24   CBC  Recent Labs Lab 07/10/13 0500  WBC 12.8*  HGB 9.2*  HCT 28.1*  MCV 97.9  PLT 221   Basic Metabolic Panel  Recent Labs Lab 07/10/13 0525 07/10/13 1005  NA 143  --   K 5.3  --   CL 99  --   CO2 24  --   GLUCOSE 44*  --   BUN 63*  --   CREATININE 10.32*  --   CALCIUM 8.4  --   PHOS 2.7 3.4    Filed  Vitals:   07/10/13 1100 07/10/13 1149 07/10/13 1200 07/10/13 1201  BP: 108/36  145/40   Pulse: 50 50 58 71  Temp:  97.6 F (36.4 C)    TempSrc:  Oral    Resp: 14 16 21 24   Height:      Weight:      SpO2: 100% 98% 99% 100%   Exam: On vent, opens eyes to commands No rash, cyanosis or gangrene Sclera anicteric, throat w ETT , blood-tinged watery fluid in tube +JVD Chest bilat occ rhonchi RRR 2/6 SEM no RG Abd soft, NTND LE trace edema bilat LUA AVF patent +bruit Neuro is nf, responds to commands in Spanish  CXR bilat air space disease d/w edema, diffuse Trop <0.30  BNP 54,473  Hb 9.2   ECHO - no old echo on chart  Dialysis: MWF [place] 3h 15min   67kg  2/2.5 Bath   P4   LUA AVF  Heparin 2000  Hectorol none     Epo 6200     Venofer 100 /wk on Wed   Assessment: 1 Acute resp failure / pulm edema- prob lean body wt loss 2 ESRD on hemodialysis 3 HTN on coreg, is at dry wt today 4 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

## 2013-07-10 NOTE — Progress Notes (Signed)
Hypoglycemic Event  CBG: 44 Treatment: D50 IV 25 mL  Symptoms: None  Follow-up CBG: Time:0800 CBG Result:116

## 2013-07-10 NOTE — Progress Notes (Signed)
  Echocardiogram 2D Echocardiogram has been performed.  Arvil ChacoFoster, Jaymason Ledesma 07/10/2013, 4:48 PM

## 2013-07-10 NOTE — Progress Notes (Signed)
INITIAL NUTRITION ASSESSMENT  DOCUMENTATION CODES Per approved criteria  -Not Applicable   INTERVENTION: 1.  Enteral nutrition; Day 1 of TF protocol start feed at 25 mL/hr for the remainder of the day.   Day 2 of TF protocol at 0600 start new goal rate of 720 mL (30 mL/hr) run from 0600-05:59.   Maintain at rate ordered unless TF's are held. If TF's are held, calculate the new volume to be provided and adjust the rate to provide total volume ordered within 24 hours (total volume-volume fed, divide this by remaining hours in the 24 hr period to get new goal rate). Max rate of 150 mL/hr. Each day at 0600 return to ordered rate.  TF at goal volume with Prostat BID will provide 1496 kcal, 88g protein, 523 mL free water.   NUTRITION DIAGNOSIS: Inadequate oral intake related to inability to eat as evidenced by vent, intubated.   Monitor:  1.  Enteral nutrition; initiation with tolerance.  Pt to meet >/=90% estimated needs with nutrition support.  2.  Wt/wt change; monitor trends  Reason for Assessment: Vent  80 y.o. male  Admitting Dx: Acute encephalopathy  ASSESSMENT: Pt admitted with encephalopathy.  Pt with ESRD on HD MWF. Pt with underlying heart failure.   Patient is currently intubated on ventilator support MV: 9.7 L/min Temp (24hrs), Avg:97.8 F (36.6 C), Min:97.7 F (36.5 C), Max:98 F (36.7 C)  Propofol: none  Abnormal labs on admission; hyperphosphatemia, hyperkalemia  Nutrition Focused Physical Exam:  Subcutaneous Fat:  Orbital Region: mild wasting Upper Arm Region: WNL Thoracic and Lumbar Region: WNL  Muscle:  Temple Region: mild wasting Clavicle Bone Region: WNL Clavicle and Acromion Bone Region: WNL Scapular Bone Region: WNL Dorsal Hand: WNL Patellar Region: WNL Anterior Thigh Region: mild wasting Posterior Calf Region: WNL  Edema: none present  Height: Ht Readings from Last 1 Encounters:  07/09/13 5\' 4"  (1.626 m)    Weight: Wt Readings from  Last 1 Encounters:  07/10/13 147 lb 4.3 oz (66.8 kg)    Ideal Body Weight: 59 kg  % Ideal Body Weight: 113%  Wt Readings from Last 10 Encounters:  07/10/13 147 lb 4.3 oz (66.8 kg)  05/15/13 152 lb (68.947 kg)  05/10/12 147 lb (66.679 kg)  11/03/11 140 lb 9.6 oz (63.776 kg)    Usual Body Weight: 140-150 lbs  % Usual Body Weight: 100%  BMI:  Body mass index is 25.27 kg/(m^2).  Estimated Nutritional Needs: Kcal: 1453 Protein: 85-99g Fluid: per MD  Skin: WNL  Diet Order: NPO  EDUCATION NEEDS: -Education not appropriate at this time   Intake/Output Summary (Last 24 hours) at 07/10/13 1115 Last data filed at 07/10/13 1100  Gross per 24 hour  Intake 782.87 ml  Output      0 ml  Net 782.87 ml    Last BM: PTA  Labs:   Recent Labs Lab 07/10/13 0525 07/10/13 1005  NA 143  --   K 5.3  --   CL 99  --   CO2 24  --   BUN 63*  --   CREATININE 10.32*  --   CALCIUM 8.4  --   MG 2.4 2.5  PHOS 2.7 3.4  GLUCOSE 44*  --     CBG (last 3)   Recent Labs  07/09/13 2338 07/10/13 0349 07/10/13 0653  GLUCAP 143* 62* 47*    Scheduled Meds: . antiseptic oral rinse  15 mL Mouth Rinse QID  . aspirin EC  81 mg  Oral Daily  . carvedilol  3.125 mg Per Tube BID WC  . chlorhexidine  15 mL Mouth Rinse BID  . clopidogrel  75 mg Per Tube Q breakfast  . famotidine (PEPCID) IV  20 mg Intravenous Q12H  . feeding supplement (PRO-STAT SUGAR FREE 64)  30 mL Per Tube BID  . feeding supplement (VITAL HIGH PROTEIN)  1,000 mL Per Tube Q24H  . heparin  5,000 Units Subcutaneous 3 times per day  . insulin aspart  2-6 Units Subcutaneous 6 times per day  . lanthanum  1,000 mg Per Tube TID WC  . piperacillin-tazobactam (ZOSYN)  IV  2.25 g Intravenous 3 times per day    Continuous Infusions: . sodium chloride 10 mL/hr at 07/09/13 2115  . fentaNYL infusion INTRAVENOUS      Past Medical History  Diagnosis Date  . Diabetes mellitus   . Hypertension   . Hyperlipidemia   . Glaucoma    . Cataract   . Peripheral vascular disease   . ESRD on dialysis     Past Surgical History  Procedure Laterality Date  . Pr vein bypass graft,aorto-fem-pop  02/20/2010    left fem pop with saphenous vein  . Av fistula placement  10/24/2009    left    Loyce Dys, MS RD LDN Clinical Inpatient Dietitian Pager: [phone] Weekend/After hours pager: [phone]

## 2013-07-10 NOTE — Progress Notes (Signed)
RT retracted pt ETT 2cm per MD. No complications. Vitals stable at this time. RT will continue to monitor.

## 2013-07-10 NOTE — Progress Notes (Signed)
PULMONARY / CRITICAL CARE MEDICINE   Name: [name] MRN: [id number] DOB: May 04, 1933    ADMISSION DATE:  07/09/2013 CONSULTATION DATE:  4/5 PRIMARY SERVICE: PCCM  CHIEF COMPLAINT:  SOB and abdominal pain  BRIEF PATIENT DESCRIPTION:   Patient is a 30 man with acute onset SOB with history of ESRD on HD (MWF), PVD (s/p left fempop) & DM on insulin. Admitted for abdominal pain and SOB. Per [facility] chart review, patient has been without fever and cough.   He had a similar episode of SOB in December at [place], told it was not HF, and thought there was something wrong with his lungs. The  patient is constantly sleepy since Dec.  He has had similar SOB prior to Dec, thought to be due to heart failure. To note, patient lives alone. He has 5 children, but only speaks with 2, a daughter in [place], [phone] - primary contact) and son in [place].     SIGNIFICANT EVENTS / STUDIES:   07/09/13- CXR in [facility]: B/l pulm edema (CD with image in chart) 4/5-CXR: underlying congestive heart failure, airspace consolidation in both bases.  4/6-CXR: worsening of airspace disease right lung base.   LINES / TUBES: ETT 07/09/13 >>>  CULTURES: 07/09/13 Blood Cx x2 >>> 07/09/13 Trach Aspiriate >>> 07/09/13 Resp Virus PCR Panel >>  ANTIBIOTICS: Vanc 4/5 >>> Zosyn 4/5 >>>  SUBJECTIVE:  The patient is sedated and intubated. No fever.    VITAL SIGNS: Temp:  [97.7 F (36.5 C)-98 F (36.7 C)] 97.9 F (36.6 C) (04/06 0408) Pulse Rate:  [48-66] 60 (04/06 0721) Resp:  [14-23] 17 (04/06 0721) BP: (101-146)/(34-69) 145/46 mmHg (04/06 0721) SpO2:  [95 %-100 %] 100 % (04/06 0721) FiO2 (%):  [40 %] 40 % (04/06 0722) Weight:  [147 lb 4.3 oz (66.8 kg)-149 lb 11.1 oz (67.9 kg)] 147 lb 4.3 oz (66.8 kg) (04/06 0500) HEMODYNAMICS:   VENTILATOR SETTINGS: Vent Mode:  [-] PRVC FiO2 (%):  [40 %] 40 % Set Rate:  [14 bmp-15 bmp] 14 bmp Vt Set:  [500 mL] 500 mL PEEP:  [5 cmH20] 5  cmH20 Pressure Support:  [8 cmH20] 8 cmH20 Plateau Pressure:  [15 cmH20-25 cmH20] 25 cmH20 INTAKE / OUTPUT: Intake/Output     04/05 0701 - 04/06 0700 04/06 0701 - 04/07 0700   I.V. (mL/kg) 100.1 (1.5)    IV Piggyback 600    Total Intake(mL/kg) 700.1 (10.5)    Net +700.1            PHYSICAL EXAMINATION: General: intubated and sedated, responsive. HEENT: PERRL (sluggish), EOMI, no scleral icterus, dentures in place Cardiac: RRR, no rubs, murmurs or gallops Pulm: decreased air mvmt bilaterally with coarse upper airway noise Abd: soft, nontender, nondistended, BS normoactive Ext: warm and well perfused, no pedal edema, LUE dialysis access with +thrill Neuro: responsive to sternal rub.   LABS:  CBC  Recent Labs Lab 07/10/13 0500  WBC 12.8*  HGB 9.2*  HCT 28.1*  PLT 221   Coag's No results found for this basename: APTT, INR,  in the last 168 hours BMET No results found for this basename: NA, K, CL, CO2, BUN, CREATININE, GLUCOSE,  in the last 168 hours Electrolytes No results found for this basename: CALCIUM, MG, PHOS,  in the last 168 hours Sepsis Markers  Recent Labs Lab 07/09/13 1900  LATICACIDVEN 1.8  PROCALCITON 4.39   ABG  Recent Labs Lab 07/09/13 1611  PHART 7.424  PCO2ART 41.1  PO2ART 68.0*  Liver Enzymes No results found for this basename: AST, ALT, ALKPHOS, BILITOT, ALBUMIN,  in the last 168 hours Cardiac Enzymes  Recent Labs Lab 07/09/13 1900 07/09/13 2310 07/10/13 0525  TROPONINI <0.30 <0.30 <0.30   Glucose  Recent Labs Lab 07/09/13 1509 07/09/13 1926 07/09/13 2338 07/10/13 0349 07/10/13 0653  GLUCAP 149* 199* 143* 62* 47*    Imaging Dg Chest Port 1 View  07/10/2013   CLINICAL DATA:  Endotracheal tube position.  EXAM: PORTABLE CHEST - 1 VIEW  COMPARISON:  07/09/2013.  FINDINGS: Endotracheal tube tip 1.5 cm above the carina. Recommend retracting by 2.5 cm to avoid mainstem bronchus intubation with change in patient's neck  position.  Diffuse airspace disease most notable mid-lower lung zones and greater on right. This may represent asymmetric pulmonary edema although infectious infiltrate not excluded in the proper clinical setting. Slight worsening of airspace disease right lung base.  Difficult to adequately assess cardiac size secondary to airspace disease.  Calcified mildly tortuous aorta.  Nasogastric tube courses below the diaphragm. Tip is not included on the present exam.  No gross pneumothorax.  IMPRESSION: Endotracheal tube tip 1.5 cm above the carina. Recommend retracting by 2.5 cm to avoid mainstem bronchus intubation with change in patient's neck position.  Slight worsening of airspace disease right lung base. Please see above.  This is a call report.   Electronically Signed   By: Bridgett Larsson M.D.   On: 07/10/2013 07:24   Dg Chest Port 1 View  07/09/2013   CLINICAL DATA:  Hypoxia  EXAM: PORTABLE CHEST - 1 VIEW  COMPARISON:  None.  FINDINGS: Endotracheal tube tip is at the carina. No pneumothorax. There is cardiomegaly with mild interstitial edema. There is consolidation in both bases. There is pulmonary venous hypertension. No adenopathy.  IMPRESSION: Endotracheal tube tip is at the carina. Advise withdrawing endotracheal tube approximately 3 cm. There is underlying congestive heart failure. There is airspace consolidation in both bases. This opacity may represent alveolar edema. Superimposed pneumonia, however, cannot be excluded. Congestive heart failure and pneumonia may exist concurrently.   Electronically Signed   By: Bretta Bang M.D.   On: 07/09/2013 17:06   Dg Abd Portable 1v  07/09/2013   CLINICAL DATA:  NG tube placement  EXAM: PORTABLE ABDOMEN - 1 VIEW  COMPARISON:  None.  FINDINGS: Moderate gastric distention with gas. NG tube with tip in proximal stomach.  IMPRESSION: NG tube in place with tip in proximal stomach. Moderate gaseous distension of the stomach.   Electronically Signed   By: Natasha Mead  M.D.   On: 07/09/2013 18:44    ASSESSMENT / PLAN:  PULMONARY A: Acute Hypoxemic Respiratory failure with pulm edema in setting of ESRD, initially on BiPap, intubated for airway protection; possible healthcare associated PNA; though acute, less likely PE given history/CXR findings (Wells criteria 0) -Procalcitonin 4.39  P:   -needs volume off -keep same MV -wean today cpap5 ps 5, goal 30 min , would need neg balance prior to extubation attempts -pcxr in am  CARDIOVASCULAR A: No evidence ischemia, r/o occult valvular dz, h/o HTN, PVD P:   -Cont ASA, Plavix, Coreg per tube -2D echo pending -brady, drop in BP, dc propofol  RENAL A:  ESRD on HD P:   -HD MWF, renal consulted, consider today -cont Fosrenal per tube -kvo  GASTROINTESTINAL A:  Abdominal pain? Unclear, has good clinical progress P:    -Famotidine - GI ppx -will talk to family / friend -assess lft -may  need CT -if not extubated, consider feeds  HEMATOLOGIC A:  DV Tprev P:  -Monitor cb cin am  -sub q hep  INFECTIOUS A:  Concern for Healthcare Associated PNA on CXR P:    -Follow blood/tracheal aspirate cultures -looks like edema, dc vanc -Zosyn maintain for now, pct algo to limit duration  ENDOCRINE A:  DM   P:   -ICU Hyperglycemia protocol  NEUROLOGIC A:  Acute Encephalopathy - uremia vs benzo (?lorazepam at [place]), not hypercarbic P:   -HD  -propofol, consider to fent  Lorretta HarpXilin Niu, MD PGY3, Internal Medicine Teaching Service Pager: [phone]   TODAY'S SUMMARY:not tolerating propofol, add fent, feed, HD  I have personally obtained a history, examined the patient, evaluated laboratory and imaging results, formulated the assessment and plan and placed orders. CRITICAL CARE: The patient is critically ill with multiple organ systems failure and requires high complexity decision making for assessment and support, frequent evaluation and titration of therapies, application of advanced monitoring  technologies and extensive interpretation of multiple databases. Critical Care Time devoted to patient care services described in this note is 40 minutes.   Mcarthur Rossettianiel J. Tyson AliasFeinstein, MD, FACP Pgr: [phone] [facility]  Pulmonary and Critical Care Medicine [facility] Pager: [phone]  07/10/2013, 7:32 AM

## 2013-07-11 ENCOUNTER — Inpatient Hospital Stay (HOSPITAL_COMMUNITY): Payer: Medicare Other

## 2013-07-11 DIAGNOSIS — I739 Peripheral vascular disease, unspecified: Secondary | ICD-10-CM

## 2013-07-11 LAB — BASIC METABOLIC PANEL
BUN: 32 mg/dL — ABNORMAL HIGH (ref 6–23)
CO2: 27 mEq/L (ref 19–32)
CREATININE: 6.18 mg/dL — AB (ref 0.50–1.35)
Calcium: 8.2 mg/dL — ABNORMAL LOW (ref 8.4–10.5)
Chloride: 89 mEq/L — ABNORMAL LOW (ref 96–112)
GFR calc Af Amer: 9 mL/min — ABNORMAL LOW (ref >90)
GFR calc non Af Amer: 8 mL/min — ABNORMAL LOW (ref >90)
Glucose, Bld: 138 mg/dL — ABNORMAL HIGH (ref 70–99)
Potassium: 5.3 mEq/L (ref 3.7–5.3)
Sodium: 135 mEq/L — ABNORMAL LOW (ref 137–147)

## 2013-07-11 LAB — MAGNESIUM
MAGNESIUM: 2.1 mg/dL (ref 1.5–2.5)
Magnesium: 1.9 mg/dL (ref 1.5–2.5)
Magnesium: 2.1 mg/dL (ref 1.5–2.5)

## 2013-07-11 LAB — POCT I-STAT 3, ART BLOOD GAS (G3+)
ACID-BASE EXCESS: 1 mmol/L (ref 0.0–2.0)
BICARBONATE: 27.3 meq/L — AB (ref 20.0–24.0)
O2 Saturation: 95 %
TCO2: 29 mmol/L (ref 0–100)
pCO2 arterial: 49.7 mmHg — ABNORMAL HIGH (ref 35.0–45.0)
pH, Arterial: 7.348 — ABNORMAL LOW (ref 7.350–7.450)
pO2, Arterial: 83 mmHg (ref 80.0–100.0)

## 2013-07-11 LAB — RESPIRATORY VIRUS PANEL
Adenovirus: NOT DETECTED
INFLUENZA A H1: NOT DETECTED
INFLUENZA A H3: NOT DETECTED
Influenza A: NOT DETECTED
Influenza B: NOT DETECTED
METAPNEUMOVIRUS: NOT DETECTED
Parainfluenza 1: NOT DETECTED
Parainfluenza 2: NOT DETECTED
Parainfluenza 3: NOT DETECTED
RESPIRATORY SYNCYTIAL VIRUS A: NOT DETECTED
RHINOVIRUS: NOT DETECTED
Respiratory Syncytial Virus B: NOT DETECTED

## 2013-07-11 LAB — CBC
HCT: 31.6 % — ABNORMAL LOW (ref 39.0–52.0)
Hemoglobin: 10.6 g/dL — ABNORMAL LOW (ref 13.0–17.0)
MCH: 32.6 pg (ref 26.0–34.0)
MCHC: 33.5 g/dL (ref 30.0–36.0)
MCV: 97.2 fL (ref 78.0–100.0)
PLATELETS: ADEQUATE 10*3/uL (ref 150–400)
RBC: 3.25 MIL/uL — ABNORMAL LOW (ref 4.22–5.81)
RDW: 14.9 % (ref 11.5–15.5)
WBC: 12.2 10*3/uL — AB (ref 4.0–10.5)

## 2013-07-11 LAB — HEPATIC FUNCTION PANEL
ALBUMIN: 3.8 g/dL (ref 3.5–5.2)
ALT: 20 U/L (ref 0–53)
AST: 36 U/L (ref 0–37)
Alkaline Phosphatase: 85 U/L (ref 39–117)
BILIRUBIN TOTAL: 0.5 mg/dL (ref 0.3–1.2)
Bilirubin, Direct: <0.2 mg/dL (ref 0.0–0.3)
Total Protein: 7.9 g/dL (ref 6.0–8.3)

## 2013-07-11 LAB — GLUCOSE, CAPILLARY
GLUCOSE-CAPILLARY: 119 mg/dL — AB (ref 70–99)
GLUCOSE-CAPILLARY: 154 mg/dL — AB (ref 70–99)
Glucose-Capillary: 108 mg/dL — ABNORMAL HIGH (ref 70–99)
Glucose-Capillary: 108 mg/dL — ABNORMAL HIGH (ref 70–99)
Glucose-Capillary: 135 mg/dL — ABNORMAL HIGH (ref 70–99)

## 2013-07-11 LAB — PHOSPHORUS
PHOSPHORUS: 4.8 mg/dL — AB (ref 2.3–4.6)
Phosphorus: 2.5 mg/dL (ref 2.3–4.6)
Phosphorus: 4 mg/dL (ref 2.3–4.6)

## 2013-07-11 LAB — TROPONIN I
TROPONIN I: 5.35 ng/mL — AB (ref <0.30)
Troponin I: 0.8 ng/mL (ref <0.30)

## 2013-07-11 MED ORDER — MORPHINE SULFATE 2 MG/ML IJ SOLN: 2.0000 mg | Freq: Once | INTRAMUSCULAR | Status: AC

## 2013-07-11 MED ORDER — MIDAZOLAM BOLUS VIA INFUSION
1.0000 mg | INTRAVENOUS | Status: DC | PRN
  Filled 2013-07-11: qty 2

## 2013-07-11 MED ORDER — MIDAZOLAM HCL 2 MG/2ML IJ SOLN
2.0000 mg | Freq: Once | INTRAMUSCULAR | Status: AC
  Administered 2013-07-11: 2 mg via INTRAVENOUS

## 2013-07-11 MED ORDER — HEPARIN SODIUM (PORCINE) 1000 UNIT/ML DIALYSIS: 1000.0000 [IU] | INTRAMUSCULAR | Status: DC | PRN

## 2013-07-11 MED ORDER — MORPHINE SULFATE 2 MG/ML IJ SOLN
2.0000 mg | INTRAMUSCULAR | Status: DC | PRN
  Administered 2013-07-16: 2 mg via INTRAVENOUS
  Filled 2013-07-11: qty 1

## 2013-07-11 MED ORDER — FENTANYL CITRATE 0.05 MG/ML IJ SOLN
50.0000 ug | Freq: Once | INTRAMUSCULAR | Status: AC
  Administered 2013-07-11: 50 ug via INTRAVENOUS

## 2013-07-11 MED ORDER — MIDAZOLAM HCL 5 MG/ML IJ SOLN
0.0000 mg/h | INTRAMUSCULAR | Status: DC
  Administered 2013-07-11 – 2013-07-13 (×2): 2 mg/h via INTRAVENOUS
  Administered 2013-07-13 – 2013-07-14 (×2): 3 mg/h via INTRAVENOUS
  Filled 2013-07-11 (×3): qty 10

## 2013-07-11 MED ORDER — DEXTROSE 5 % IV SOLN
2.0000 ug/min | INTRAVENOUS | Status: DC
  Administered 2013-07-11: 10 ug/min via INTRAVENOUS
  Administered 2013-07-12: 7 ug/min via INTRAVENOUS
  Filled 2013-07-11: qty 16

## 2013-07-11 MED ORDER — MIDAZOLAM HCL 2 MG/2ML IJ SOLN
INTRAMUSCULAR | Status: AC
  Administered 2013-07-11: 19:00:00
  Filled 2013-07-11: qty 2

## 2013-07-11 MED ORDER — PROPOFOL 10 MG/ML IV BOLUS
90.0000 mg | Freq: Once | INTRAVENOUS | Status: AC
  Administered 2013-07-11: 90 mg via INTRAVENOUS

## 2013-07-11 MED ORDER — FENTANYL CITRATE 0.05 MG/ML IJ SOLN
INTRAMUSCULAR | Status: AC
  Administered 2013-07-11: 100 ug via INTRAVENOUS
  Filled 2013-07-11: qty 4

## 2013-07-11 MED ORDER — LIDOCAINE HCL (PF) 1 % IJ SOLN: 5.0000 mL | INTRAMUSCULAR | Status: DC | PRN

## 2013-07-11 MED ORDER — SODIUM CHLORIDE 0.9 % IV SOLN: 100.0000 mL | INTRAVENOUS | Status: DC | PRN

## 2013-07-11 MED ORDER — PROPOFOL 10 MG/ML IV EMUL
0.0000 ug/kg/min | INTRAVENOUS | Status: DC
Start: 2013-07-11 — End: 2013-07-11

## 2013-07-11 MED ORDER — MIDAZOLAM HCL 2 MG/2ML IJ SOLN
INTRAMUSCULAR | Status: AC
  Administered 2013-07-11: 2 mg via INTRAVENOUS
  Filled 2013-07-11: qty 4

## 2013-07-11 MED ORDER — PROPOFOL 10 MG/ML IV BOLUS
INTRAVENOUS | Status: AC
  Administered 2013-07-11: 90 mg via INTRAVENOUS
  Filled 2013-07-11: qty 20

## 2013-07-11 MED ORDER — PENTAFLUOROPROP-TETRAFLUOROETH EX AERO: 1.0000 "application " | INHALATION_SPRAY | CUTANEOUS | Status: DC | PRN

## 2013-07-11 MED ORDER — SODIUM CHLORIDE 0.9 % IV SOLN
0.0000 ug/h | INTRAVENOUS | Status: DC
  Administered 2013-07-11 – 2013-07-13 (×2): 100 ug/h via INTRAVENOUS
  Filled 2013-07-11 (×2): qty 50

## 2013-07-11 MED ORDER — FENTANYL BOLUS VIA INFUSION
25.0000 ug | INTRAVENOUS | Status: DC | PRN
  Filled 2013-07-11: qty 50

## 2013-07-11 MED ORDER — MORPHINE SULFATE 2 MG/ML IJ SOLN
INTRAMUSCULAR | Status: AC
  Administered 2013-07-11: 2 mg via INTRAVENOUS
  Filled 2013-07-11: qty 1

## 2013-07-11 MED ORDER — ALTEPLASE 2 MG IJ SOLR
2.0000 mg | Freq: Once | INTRAMUSCULAR | Status: AC | PRN
  Filled 2013-07-11: qty 2

## 2013-07-11 MED ORDER — FENTANYL CITRATE 0.05 MG/ML IJ SOLN
100.0000 ug | Freq: Once | INTRAMUSCULAR | Status: AC
  Administered 2013-07-11: 100 ug via INTRAVENOUS

## 2013-07-11 MED ORDER — NITROGLYCERIN 2 % TD OINT
1.0000 [in_us] | TOPICAL_OINTMENT | Freq: Four times a day (QID) | TRANSDERMAL | Status: DC
  Filled 2013-07-11: qty 30

## 2013-07-11 MED ORDER — LIDOCAINE-PRILOCAINE 2.5-2.5 % EX CREA
1.0000 "application " | TOPICAL_CREAM | CUTANEOUS | Status: DC | PRN
  Filled 2013-07-11: qty 5

## 2013-07-11 MED ORDER — NEPRO/CARBSTEADY PO LIQD
237.0000 mL | ORAL | Status: DC | PRN
  Filled 2013-07-11: qty 237

## 2013-07-11 MED ORDER — HEPARIN SODIUM (PORCINE) 1000 UNIT/ML DIALYSIS: 2000.0000 [IU] | Freq: Once | INTRAMUSCULAR | Status: DC

## 2013-07-11 MED ORDER — DEXTROSE 5 % IV SOLN
2.0000 ug/min | INTRAVENOUS | Status: DC
Start: 2013-07-11 — End: 2013-07-11
  Filled 2013-07-11: qty 4

## 2013-07-11 MED ORDER — FENTANYL CITRATE 0.05 MG/ML IJ SOLN
25.0000 ug | Freq: Once | INTRAMUSCULAR | Status: AC
  Administered 2013-07-11: 25 ug via INTRAVENOUS

## 2013-07-11 NOTE — Progress Notes (Signed)
PULMONARY / CRITICAL CARE MEDICINE   Name: [name] MRN: [id number] DOB: May 12, 1933    ADMISSION DATE:  07/09/2013 CONSULTATION DATE:  4/5 PRIMARY SERVICE: PCCM  CHIEF COMPLAINT:  SOB and abdominal pain  BRIEF PATIENT DESCRIPTION:   Patient is a 89 man with acute onset SOB with history of ESRD on HD (MWF), PVD (s/p left fempop) & DM on insulin. Admitted for abdominal pain and SOB. Per [name] review, patient has been without fever and cough.   He had a similar episode of SOB in December at [name], told it was not HF, and thought there was something wrong with his lungs. The  patient is constantly sleepy since Dec.  He has had similar SOB prior to Dec, thought to be due to heart failure. To note, patient lives alone. He has 5 children, but only speaks with 2, a daughter in [name]([name], [phone] - primary contact) and son in [name].     SIGNIFICANT EVENTS / STUDIES:   07/09/13- [facility]: B/l pulm edema (CD with image in chart) 4/5-CXR: underlying congestive heart failure, airspace consolidation in both bases.  4/6-CXR: worsening of airspace disease right lung base.  4/6- did HD 4/7-CXR: No significant change of airspace disease   LINES / TUBES: ETT 07/09/13 >>>  CULTURES: 07/09/13 Blood Cx x2 >>> 07/09/13 Trach Aspiriate >>> 07/09/13 Resp Virus PCR Panel >>  ANTIBIOTICS: Vanc 4/5 >>> Zosyn 4/5 >>>4/6  SUBJECTIVE:  The patient is sedated and intubated. No fever.   VITAL SIGNS: Temp:  [97.6 F (36.4 C)-99.5 F (37.5 C)] 98.9 F (37.2 C) (04/07 0425) Pulse Rate:  [43-84] 56 (04/07 0500) Resp:  [11-24] 14 (04/07 0500) BP: (78-177)/(25-132) 107/31 mmHg (04/07 0500) SpO2:  [95 %-100 %] 98 % (04/07 0500) FiO2 (%):  [40 %] 40 % (04/07 0328) Weight:  [140 lb 3.4 oz (63.6 kg)-146 lb 9.7 oz (66.5 kg)] 143 lb 15.4 oz (65.3 kg) (04/07 0500) HEMODYNAMICS:   VENTILATOR SETTINGS: Vent Mode:  [-] PRVC FiO2 (%):  [40 %] 40 % Set Rate:  [14 bmp]  14 bmp Vt Set:  [500 mL] 500 mL PEEP:  [5 cmH20] 5 cmH20 Plateau Pressure:  [10 cmH20-25 cmH20] 20 cmH20 INTAKE / OUTPUT: Intake/Output     04/06 0701 - 04/07 0700 04/07 0701 - 04/08 0700   I.V. (mL/kg) 561.7 (8.6)    NG/GT 570    IV Piggyback 110    Total Intake(mL/kg) 1241.7 (19)    Other 3000    Total Output 3000     Net -1758.3            PHYSICAL EXAMINATION: General: intubated and sedated, responsive. HEENT: PERRL (sluggish), EOMI, no scleral icterus, dentures in place Cardiac: RRR, no rubs, murmurs or gallops Pulm: decreased air mvmt bilaterally with coarse upper airway noise Abd: soft, nontender, nondistended, BS normoactive Ext: warm and well perfused, no pedal edema, LUE dialysis access with +thrill Neuro: responsive to sternal rub.  LABS:  CBC  Recent Labs Lab 07/10/13 0500 07/11/13 0246  WBC 12.8* 12.2*  HGB 9.2* 10.6*  HCT 28.1* 31.6*  PLT 221 PLATELET CLUMPS NOTED ON SMEAR, COUNT APPEARS ADEQUATE   Coag's No results found for this basename: APTT, INR,  in the last 168 hours BMET  Recent Labs Lab 07/10/13 0525 07/11/13 0246  NA 143 135*  K 5.3 5.3  CL 99 89*  CO2 24 27  BUN 63* 32*  CREATININE 10.32* 6.18*  GLUCOSE 44* 138*  Electrolytes  Recent Labs Lab 07/10/13 0525 07/10/13 1005 07/11/13 0055 07/11/13 0246  CALCIUM 8.4  --   --  8.2*  MG 2.4 2.5 1.9  --   PHOS 2.7 3.4 4.8*  --    Sepsis Markers  Recent Labs Lab 07/09/13 1900  LATICACIDVEN 1.8  PROCALCITON 4.39   ABG  Recent Labs Lab 07/09/13 1611  PHART 7.424  PCO2ART 41.1  PO2ART 68.0*   Liver Enzymes No results found for this basename: AST, ALT, ALKPHOS, BILITOT, ALBUMIN,  in the last 168 hours Cardiac Enzymes  Recent Labs Lab 07/09/13 1900 07/09/13 2310 07/10/13 0525 07/10/13 0910  TROPONINI <0.30 <0.30 <0.30  --   PROBNP  --   --   --  54473.0*   Glucose  Recent Labs Lab 07/10/13 0857 07/10/13 1148 07/10/13 1538 07/10/13 1913 07/11/13  07/11/13 0411  GLUCAP 116* 97 173* 95 154* 108*    Imaging Dg Chest Port 1 View  07/10/2013   CLINICAL DATA:  Endotracheal tube position.  EXAM: PORTABLE CHEST - 1 VIEW  COMPARISON:  07/09/2013.  FINDINGS: Endotracheal tube tip 1.5 cm above the carina. Recommend retracting by 2.5 cm to avoid mainstem bronchus intubation with change in patient's neck position.  Diffuse airspace disease most notable mid-lower lung zones and greater on right. This may represent asymmetric pulmonary edema although infectious infiltrate not excluded in the proper clinical setting. Slight worsening of airspace disease right lung base.  Difficult to adequately assess cardiac size secondary to airspace disease.  Calcified mildly tortuous aorta.  Nasogastric tube courses below the diaphragm. Tip is not included on the present exam.  No gross pneumothorax.  IMPRESSION: Endotracheal tube tip 1.5 cm above the carina. Recommend retracting by 2.5 cm to avoid mainstem bronchus intubation with change in patient's neck position.  Slight worsening of airspace disease right lung base. Please see above.  This is a call report.   Electronically Signed   By: Bridgett Larsson M.D.   On: 07/10/2013 07:24   Dg Chest Port 1 View  07/09/2013   CLINICAL DATA:  Hypoxia  EXAM: PORTABLE CHEST - 1 VIEW  COMPARISON:  None.  FINDINGS: Endotracheal tube tip is at the carina. No pneumothorax. There is cardiomegaly with mild interstitial edema. There is consolidation in both bases. There is pulmonary venous hypertension. No adenopathy.  IMPRESSION: Endotracheal tube tip is at the carina. Advise withdrawing endotracheal tube approximately 3 cm. There is underlying congestive heart failure. There is airspace consolidation in both bases. This opacity may represent alveolar edema. Superimposed pneumonia, however, cannot be excluded. Congestive heart failure and pneumonia may exist concurrently.   Electronically Signed   By: Bretta Bang M.D.   On: 07/09/2013 17:06    Dg Abd Portable 1v  07/09/2013   CLINICAL DATA:  NG tube placement  EXAM: PORTABLE ABDOMEN - 1 VIEW  COMPARISON:  None.  FINDINGS: Moderate gastric distention with gas. NG tube with tip in proximal stomach.  IMPRESSION: NG tube in place with tip in proximal stomach. Moderate gaseous distension of the stomach.   Electronically Signed   By: Natasha Mead M.D.   On: 07/09/2013 18:44    ASSESSMENT / PLAN:  PULMONARY A: Acute Hypoxemic Respiratory failure with pulm edema in setting of ESRD, initially on BiPap, intubated for airway protection; possible healthcare associated PNA; though acute, less likely PE given history/CXR findings (Wells criteria 0) -Procalcitonin 4.39 -Improving  P:   -keep same MV -may extubate (i/o negative 1049 ml since admission),  wean attempt cpap5 ps 5, RSBI assessment -likely needs further HD -see ID -pcxr in am  CARDIOVASCULAR A: No evidence ischemia, r/o occult valvular dz, h/o HTN, PVD -bradycardia. On BB P:   -Cont ASA, Plavix, Coreg per tube -2D echo pending -follow BP with HD  RENAL A:  ESRD on HD P:   -HD MWF, renal consulted. Did HD on 4/6. Cre 6.8, for repeat -cont Fosrenal per tube -kvo  GASTROINTESTINAL A: NO ABDO PAIN noted P:    -Famotidine - GI ppx -Add LFt again -diet if extubated  HEMATOLOGIC A:  DVT prev P:  -Monitor cb cin am  -sub q hep  INFECTIOUS A:  No evidence infection P:    -Zosyn dc -improved after HD as main improvement procedure or med  ENDOCRINE A:  DM   P:   -ICU Hyperglycemia protocol  NEUROLOGIC A:  Acute Encephalopathy - uremia vs benzo (?lorazepam at [place]), not hypercarbic P:   -HD  -fent -WUA  Lorretta Harp, MD PGY3, Internal Medicine Teaching Service Pager: [phone]   TODAY'S SUMMARY:not tolerating propofol, add fent, feed, HD  I have personally obtained a history, examined the patient, evaluated laboratory and imaging results, formulated the assessment and plan and placed  orders. CRITICAL CARE: The patient is critically ill with multiple organ systems failure and requires high complexity decision making for assessment and support, frequent evaluation and titration of therapies, application of advanced monitoring technologies and extensive interpretation of multiple databases. Critical Care Time devoted to patient care services described in this note is 30 minutes.   Mcarthur Rossetti. Tyson Alias, MD, FACP Pgr: [phone] [facility]  Pulmonary and Critical Care Medicine [facility] Pager: [phone]  07/11/2013, 7:05 AM

## 2013-07-11 NOTE — Progress Notes (Signed)
CRITICAL VALUE ALERT  Critical value received:  Troponin 5.3  Date of notification:  07/11/13  Time of notification:  2243  Critical value read back:yes  Nurse who received alert:  Juanda BondJames Smitty Ackerley RN  MD notified (1st page):  Called elink  Time of first page:   RN notified (2nd page):Nicki, RN Elink  Time of second page:  Responding MD:  Time MD responded:

## 2013-07-11 NOTE — Procedures (Signed)
Intubation Procedure Note [name] [id number] 30-Jul-1933  Procedure: Intubation Indications: Respiratory insufficiency  Procedure Details Consent: Unable to obtain consent because of emergent medical necessity. Time Out: Verified patient identification, verified procedure, site/side was marked, verified correct patient position, special equipment/implants available, medications/allergies/relevent history reviewed, required imaging and test results available.  Performed  Maximum sterile technique was used including cap, gloves, gown and hand hygiene.  MAC and 3    Evaluation Hemodynamic Status: BP stable throughout; O2 sats: stable throughout Patient's Current Condition: stable Complications: No apparent complications Patient did tolerate procedure well. Chest X-ray ordered to verify placement.  CXR: pending.   Nelda BucksFEINSTEIN,Pixie Burgener J. 07/11/2013   gl[place] pulm edema coming from airway

## 2013-07-11 NOTE — Procedures (Signed)
Extubation Procedure Note  Patient Details:   Name: [name] DOB: 12-31-1933 MRN: [id number]   Airway Documentation:     Evaluation  O2 sats: stable throughout Complications: No apparent complications Patient did tolerate procedure well.  Bilateral Breath Sounds: Clear Suctioning: Oral;Airway Yes  Pt self extubated, and was placed on 4lpm [place]. No dyspnea or stridor noted after extubation. ABG in 20 minutes to reassess. RT will continue to monitor.   Armando GangMike, Mimi Debellis C 07/11/2013, 10:32 AM

## 2013-07-11 NOTE — Procedures (Signed)
Central Venous Catheter Insertion Procedure Note [name] [id number] 02/21/1934  Procedure: Insertion of Central Venous Catheter Indications: Assessment of intravascular volume, Drug and/or fluid administration and Frequent blood sampling  Procedure Details Consent: Unable to obtain consent because of emergent medical necessity. Time Out: Verified patient identification, verified procedure, site/side was marked, verified correct patient position, special equipment/implants available, medications/allergies/relevent history reviewed, required imaging and test results available.  Performed  Maximum sterile technique was used including antiseptics, cap, gloves, gown, hand hygiene, mask and sheet. Skin prep: Chlorhexidine; local anesthetic administered A antimicrobial bonded/coated triple lumen catheter was placed in the right internal jugular vein using the Seldinger technique.  Evaluation Blood flow good Complications: No apparent complications Patient did tolerate procedure well. Chest X-ray ordered to verify placement.  CXR: pending.  Nelda BucksFEINSTEIN,DANIEL J. 07/11/2013, 4:50 PM  With Dr Youlanda RoysNui US  Mcarthur Rossettianiel J. Tyson AliasFeinstein, MD, FACP Pgr: [phone] [facility]

## 2013-07-11 NOTE — Procedures (Signed)
I was present at this dialysis session, have reviewed the session itself and made  appropriate changes  Vinson Moselleob Romolo Sieling MD (pgr) [phone]    [phone] 07/10/2013, 3:00 PM

## 2013-07-11 NOTE — Progress Notes (Signed)
[facility] Progress Note   Subjective: 3kg off w HD yesterday, CXR still w layering effusions. Pt is alert, hands in mitts.  Filed Vitals:   07/11/13 0700 07/11/13 0814 07/11/13 0837 07/11/13 1015  BP: 127/33 179/53  178/52  Pulse: 63 91  79  Temp:   99.5 F (37.5 C)   TempSrc:   Oral   Resp: 15 17  17   Height:      Weight:      SpO2: 100% 100%  98%   Exam: Off vent, stable , alert, responds appropriately but is not fully oriented +JVD  Chest bilat occ rhonchi  RRR 2/6 SEM no RG  Abd soft, NTND  LE trace edema bilat  LUA AVF patent +bruit  Neuro is nonfocal, disoriented "[place] " = at home  ECHO - no old echo on chart  Dialysis: MWF [place]  3h 67kg 2/2.5 [place] P4 LUA AVF Heparin 2000  Hectorol none Epo 6200 Venofer 100 /wk on Wed  Assessment:  1 Acute resp failure / pulm edema- improving, extubated, CXR still abnormal 2 ESRD 3 HTN/volume-  on coreg, 2kg under dry wt, still prob has extra volume 4 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 HD tomorrow short to keep on schedule.      Vinson Moselle MD  pager [phone]    cell [phone]  07/11/2013, 11:11 AM     Recent Labs Lab 07/10/13 0525 07/10/13 1005 07/11/13 0055 07/11/13 0246  NA 143  --   --  135*  K 5.3  --   --  5.3  CL 99  --   --  89*  CO2 24  --   --  27  GLUCOSE 44*  --   --  138*  BUN 63*  --   --  32*  CREATININE 10.32*  --   --  6.18*  CALCIUM 8.4  --   --  8.2*  PHOS 2.7 3.4 4.8*  --    No results found for this basename: AST, ALT, ALKPHOS, BILITOT, PROT, ALBUMIN,  in the last 168 hours  Recent Labs Lab 07/10/13 0500 07/11/13 0246  WBC 12.8* 12.2*  HGB 9.2* 10.6*  HCT 28.1* 31.6*  MCV 97.9 97.2  PLT 221 PLATELET CLUMPS NOTED ON SMEAR, COUNT APPEARS ADEQUATE   . antiseptic oral rinse  15 mL Mouth Rinse QID   . aspirin EC  81 mg Oral Daily  . chlorhexidine  15 mL Mouth Rinse BID  . clopidogrel  75 mg Per Tube Q breakfast  . darbepoetin (ARANESP) injection - DIALYSIS  40 mcg Intravenous Q Mon-HD  . famotidine (PEPCID) IV  20 mg Intravenous Daily  . feeding supplement (NEPRO CARB STEADY)  1,000 mL Per Tube Q24H  . feeding supplement (PRO-STAT SUGAR FREE 64)  30 mL Per Tube BID  . [START ON 07/12/2013] ferric gluconate (FERRLECIT/NULECIT) IV  125 mg Intravenous Q M,W,F-HD  . heparin  5,000 Units Subcutaneous 3 times per day  . insulin aspart  2-6 Units Subcutaneous 6 times per day  . lanthanum  1,000 mg Per Tube TID WC  . piperacillin-tazobactam (ZOSYN)  IV  2.25 g Intravenous 3 times per day   . sodium chloride 10 mL/hr at 07/09/13 2115  . fentaNYL infusion INTRAVENOUS  200 mcg/hr (07/10/13 2340)   sodium chloride, dextrose, fentaNYL

## 2013-07-12 ENCOUNTER — Inpatient Hospital Stay (HOSPITAL_COMMUNITY): Payer: Medicare Other

## 2013-07-12 ENCOUNTER — Encounter (HOSPITAL_COMMUNITY)
Admission: AD | Disposition: A | Discharge status: Rehabilitation Facility | Payer: Self-pay | Source: Other Acute Inpatient Hospital | Attending: Internal Medicine

## 2013-07-12 DIAGNOSIS — I214 Non-ST elevation (NSTEMI) myocardial infarction: Secondary | ICD-10-CM

## 2013-07-12 DIAGNOSIS — I2510 Atherosclerotic heart disease of native coronary artery without angina pectoris: Secondary | ICD-10-CM

## 2013-07-12 HISTORY — PX: LEFT AND RIGHT HEART CATHETERIZATION WITH CORONARY ANGIOGRAM: SHX5449

## 2013-07-12 LAB — BASIC METABOLIC PANEL
BUN: 25 mg/dL — ABNORMAL HIGH (ref 6–23)
CALCIUM: 8.3 mg/dL — AB (ref 8.4–10.5)
CO2: 24 mEq/L (ref 19–32)
Chloride: 97 mEq/L (ref 96–112)
Creatinine, Ser: 5.16 mg/dL — ABNORMAL HIGH (ref 0.50–1.35)
GFR calc non Af Amer: 10 mL/min — ABNORMAL LOW (ref >90)
GFR, EST AFRICAN AMERICAN: 11 mL/min — AB (ref >90)
Glucose, Bld: 122 mg/dL — ABNORMAL HIGH (ref 70–99)
POTASSIUM: 4.1 meq/L (ref 3.7–5.3)
Sodium: 141 mEq/L (ref 137–147)

## 2013-07-12 LAB — APTT: aPTT: 46 seconds — ABNORMAL HIGH (ref 24–37)

## 2013-07-12 LAB — POCT I-STAT 3, ART BLOOD GAS (G3+)
Acid-Base Excess: 9 mmol/L — ABNORMAL HIGH (ref 0.0–2.0)
Bicarbonate: 32.1 mEq/L — ABNORMAL HIGH (ref 20.0–24.0)
O2 Saturation: 100 %
PCO2 ART: 39.2 mmHg (ref 35.0–45.0)
PH ART: 7.521 — AB (ref 7.350–7.450)
TCO2: 33 mmol/L (ref 0–100)
pO2, Arterial: 444 mmHg — ABNORMAL HIGH (ref 80.0–100.0)

## 2013-07-12 LAB — POCT ACTIVATED CLOTTING TIME
ACTIVATED CLOTTING TIME: 193 s
ACTIVATED CLOTTING TIME: 177 s
Activated Clotting Time: 243 seconds
Activated Clotting Time: 193 seconds
Activated Clotting Time: 172 seconds

## 2013-07-12 LAB — POCT I-STAT 3, VENOUS BLOOD GAS (G3P V)
Acid-Base Excess: 5 mmol/L — ABNORMAL HIGH (ref 0.0–2.0)
Bicarbonate: 29.7 mEq/L — ABNORMAL HIGH (ref 20.0–24.0)
O2 Saturation: 68 %
PCO2 VEN: 43.6 mmHg — AB (ref 45.0–50.0)
PO2 VEN: 34 mmHg (ref 30.0–45.0)
TCO2: 31 mmol/L (ref 0–100)
pH, Ven: 7.441 — ABNORMAL HIGH (ref 7.250–7.300)

## 2013-07-12 LAB — GLUCOSE, CAPILLARY
GLUCOSE-CAPILLARY: 147 mg/dL — AB (ref 70–99)
Glucose-Capillary: 129 mg/dL — ABNORMAL HIGH (ref 70–99)
Glucose-Capillary: 103 mg/dL — ABNORMAL HIGH (ref 70–99)
Glucose-Capillary: 106 mg/dL — ABNORMAL HIGH (ref 70–99)
Glucose-Capillary: 193 mg/dL — ABNORMAL HIGH (ref 70–99)
Glucose-Capillary: 143 mg/dL — ABNORMAL HIGH (ref 70–99)

## 2013-07-12 LAB — TROPONIN I: Troponin I: 9.94 ng/mL (ref <0.30)

## 2013-07-12 LAB — CULTURE, RESPIRATORY W GRAM STAIN

## 2013-07-12 LAB — CULTURE, RESPIRATORY

## 2013-07-12 LAB — PROTIME-INR
INR: 1.19 (ref 0.00–1.49)
Prothrombin Time: 14.8 seconds (ref 11.6–15.2)

## 2013-07-12 LAB — HEPARIN LEVEL (UNFRACTIONATED)

## 2013-07-12 LAB — TRIGLYCERIDES: Triglycerides: 133 mg/dL (ref <150)

## 2013-07-12 SURGERY — LEFT AND RIGHT HEART CATHETERIZATION WITH CORONARY ANGIOGRAM
Anesthesia: LOCAL

## 2013-07-12 MED ORDER — HEPARIN (PORCINE) IN NACL 100-0.45 UNIT/ML-% IJ SOLN
900.0000 [IU]/h | INTRAMUSCULAR | Status: DC
  Administered 2013-07-12: 900 [IU]/h via INTRAVENOUS
  Filled 2013-07-12 (×2): qty 250

## 2013-07-12 MED ORDER — SODIUM CHLORIDE 0.9 % IV SOLN: 100.0000 mL | INTRAVENOUS | Status: DC | PRN

## 2013-07-12 MED ORDER — SODIUM CHLORIDE 0.9 % IV SOLN
100.0000 mL | INTRAVENOUS | Status: DC | PRN
Start: 2013-07-12 — End: 2013-07-12

## 2013-07-12 MED ORDER — LIDOCAINE HCL (PF) 1 % IJ SOLN: 5.0000 mL | INTRAMUSCULAR | Status: DC | PRN

## 2013-07-12 MED ORDER — SODIUM CHLORIDE 0.9 % IV SOLN: 250.0000 mL | INTRAVENOUS | Status: DC | PRN

## 2013-07-12 MED ORDER — SODIUM CHLORIDE 0.9 % IJ SOLN: 3.0000 mL | INTRAMUSCULAR | Status: DC | PRN

## 2013-07-12 MED ORDER — ALTEPLASE 2 MG IJ SOLR: 2.0000 mg | Freq: Once | INTRAMUSCULAR | Status: DC | PRN

## 2013-07-12 MED ORDER — NEPRO/CARBSTEADY PO LIQD: 237.0000 mL | ORAL | Status: DC | PRN

## 2013-07-12 MED ORDER — HEPARIN SODIUM (PORCINE) 1000 UNIT/ML DIALYSIS: 2000.0000 [IU] | INTRAMUSCULAR | Status: DC | PRN

## 2013-07-12 MED ORDER — HEPARIN SODIUM (PORCINE) 1000 UNIT/ML DIALYSIS
2000.0000 [IU] | Freq: Once | INTRAMUSCULAR | Status: AC
  Administered 2013-07-12: 2000 [IU] via INTRAVENOUS_CENTRAL

## 2013-07-12 MED ORDER — LIDOCAINE-PRILOCAINE 2.5-2.5 % EX CREA: 1.0000 "application " | TOPICAL_CREAM | CUTANEOUS | Status: DC | PRN

## 2013-07-12 MED ORDER — HEPARIN (PORCINE) IN NACL 2-0.9 UNIT/ML-% IJ SOLN
INTRAMUSCULAR | Status: AC
  Filled 2013-07-12: qty 1500

## 2013-07-12 MED ORDER — ATORVASTATIN CALCIUM 40 MG PO TABS
40.0000 mg | ORAL_TABLET | Freq: Every day | ORAL | Status: DC
  Administered 2013-07-12 – 2013-07-20 (×9): 40 mg via ORAL
  Filled 2013-07-12 (×9): qty 1

## 2013-07-12 MED ORDER — PENTAFLUOROPROP-TETRAFLUOROETH EX AERO: 1.0000 "application " | INHALATION_SPRAY | CUTANEOUS | Status: DC | PRN

## 2013-07-12 MED ORDER — HEPARIN SODIUM (PORCINE) 1000 UNIT/ML DIALYSIS: 1000.0000 [IU] | INTRAMUSCULAR | Status: DC | PRN

## 2013-07-12 MED ORDER — MUPIROCIN 2 % EX OINT
TOPICAL_OINTMENT | CUTANEOUS | Status: AC
  Filled 2013-07-12: qty 22

## 2013-07-12 MED ORDER — DARBEPOETIN ALFA-POLYSORBATE 40 MCG/0.4ML IJ SOLN
INTRAMUSCULAR | Status: AC
  Filled 2013-07-12: qty 0.4

## 2013-07-12 MED ORDER — HEPARIN (PORCINE) IN NACL 2-0.9 UNIT/ML-% IJ SOLN
INTRAMUSCULAR | Status: AC
  Filled 2013-07-12: qty 500

## 2013-07-12 MED ORDER — HEPARIN BOLUS VIA INFUSION
2000.0000 [IU] | Freq: Once | INTRAVENOUS | Status: AC
  Administered 2013-07-12: 2000 [IU] via INTRAVENOUS
  Filled 2013-07-12: qty 2000

## 2013-07-12 MED ORDER — LIDOCAINE HCL (PF) 1 % IJ SOLN
INTRAMUSCULAR | Status: AC
  Filled 2013-07-12: qty 30

## 2013-07-12 MED ORDER — MIDAZOLAM HCL 2 MG/2ML IJ SOLN
INTRAMUSCULAR | Status: AC
  Filled 2013-07-12: qty 2

## 2013-07-12 MED ORDER — NITROGLYCERIN IN D5W 200-5 MCG/ML-% IV SOLN
INTRAVENOUS | Status: AC
  Filled 2013-07-12: qty 250

## 2013-07-12 MED ORDER — SODIUM CHLORIDE 0.9 % IJ SOLN
3.0000 mL | Freq: Two times a day (BID) | INTRAMUSCULAR | Status: DC
  Administered 2013-07-12 – 2013-07-13 (×2): 3 mL via INTRAVENOUS

## 2013-07-12 MED ORDER — FENTANYL CITRATE 0.05 MG/ML IJ SOLN
INTRAMUSCULAR | Status: AC
  Filled 2013-07-12: qty 2

## 2013-07-12 MED ORDER — HEPARIN (PORCINE) IN NACL 100-0.45 UNIT/ML-% IJ SOLN
850.0000 [IU]/h | INTRAMUSCULAR | Status: DC
  Filled 2013-07-12 (×2): qty 250

## 2013-07-12 NOTE — Progress Notes (Signed)
I spoke with patient's daughter Shanda BumpsJessica from [place] [phone] and discussed right and left heart cath with possible PCI today. Telephone consent obtained with additional 2 nurse verification who have spoken with daughter after my discussion. Lennette Biharihomas A Kelly, MD 07/12/2013 11:11 AM

## 2013-07-12 NOTE — Progress Notes (Signed)
ANTICOAGULATION CONSULT NOTE - Follow Up Consult  Pharmacy Consult for Heparin Indication: 3VCAD  No Known Allergies  Patient Measurements: Height: 5\' 4"  (162.6 cm) Weight: 135 lb 2.3 oz (61.3 kg) IBW/kg (Calculated) : 59.2 Heparin Dosing Weight: 61.3 kg  Vital Signs: Temp: 99 F (37.2 C) (04/08 1544) Temp src: Oral (04/08 1544) BP: 152/46 mmHg (04/08 1800) Pulse Rate: 78 (04/08 1800)  Labs:  Recent Labs  07/10/13 0500 07/10/13 0525 07/11/13 0246 07/11/13 1537 07/11/13 2200 07/12/13 0030 07/12/13 0255  HGB 9.2*  --  10.6*  --   --   --   --   HCT 28.1*  --  31.6*  --   --   --   --   PLT 221  --  PLATELET CLUMPS NOTED ON SMEAR, COUNT APPEARS ADEQUATE  --   --   --   --   APTT  --   --   --   --   --  46*  --   LABPROT  --   --   --   --   --  14.8  --   INR  --   --   --   --   --  1.19  --   HEPARINUNFRC  --   --   --   --   --  <0.10*  --   CREATININE  --  10.32* 6.18*  --   --   --  5.16*  TROPONINI  --  <0.30  --  0.80* 5.35*  --  9.94*    Estimated Creatinine Clearance: 10 ml/min (by C-G formula based on Cr of 5.16).   Assessment:  Patient to resume IV heparin post-cath for 3VCAD. Cardiac surgery to be consulted.  Goal of Therapy:  Heparin level 0.3-0.7 units/ml Monitor platelets by anticoagulation protocol: Yes   Plan:  Start IV heparin 8 hrs after sheath pulled (at 1810). Start IV heparin 4/9 at 0200 at rate of 850 units/hr Will check heparin level and CBC 8 hrs after that   Kacie Huxtable S. Merilynn Finlandobertson, PharmD, [facility] Clinical Staff Pharmacist Pager [phone]  Carollee Massedrystal Stillinger Merilynn Finlandobertson 07/12/2013,6:30 PM

## 2013-07-12 NOTE — CV Procedure (Signed)
   Cardiac Catheterization Procedure Note  Name: [name] MRN: [id number] DOB: 16-Jul-1933  Procedure: Right Heart Cath, Left Heart Cath, Selective Coronary Angiography, LV angiography  Indication: 80 yo BM with ESRD presented with acute pulmonary edema and NSTEMI.   Procedural Details: The right groin was prepped, draped, and anesthetized with 1% lidocaine. Using the modified Seldinger technique a 5 French sheath was placed in the right femoral artery and a 7 French sheath was placed in the right femoral vein. A Swan-Ganz catheter was used for the right heart catheterization. Standard protocol was followed for recording of right heart pressures and sampling of oxygen saturations. Fick cardiac output was calculated. Standard Judkins catheters were used for selective coronary angiography and left ventriculography. There were no immediate procedural complications. The patient was transferred to the post catheterization recovery area for further monitoring.  Procedural Findings: Hemodynamics RA 12/9 mean 9 mm Hg RV 35/7 mm Hg PA 35/16 mean 24 mm Hg  PCWP 13/12 mean 11 mm Hg LV 140/13 mm Hg AO 140/45 mean 76 mm Hg  Oxygen saturations: PA 67% AO 100%  Cardiac Output (Fick) 4.6 L/min  Cardiac Index (Fick) 2.8 L/min/meter squared   Coronary angiography: Coronary dominance: right  Left mainstem: Mild ostial narrowing of 20%.   Left anterior descending (LAD): 80-90% stenosis immediately after the first septal perforator. 60-70% focal stenosis after the second septal perforator. The first diagonal is small with 80% ostial stenosis. The second diagonal is very small with 70-80% ostial disease.   Left circumflex (LCx): The LCx bifurcates into 2 OM branches. Just prior to the bifurcation there is a 90% stenosis. This extends into the proximal OM1 up to 95%. The OM1 bifurcates distally and the more lateral branch is occluded with collaterals. There is an 80% stenosis in the second OM.    Right coronary artery (RCA): There is an 80% proximal stenosis. There is 50% disease distally prior to the PDA/PLOM bifurcation.  Left ventriculography: LV size is small, Left ventricular systolic function is normal, LVEF is estimated at 65%, there is no significant mitral regurgitation   Final Conclusions:   1. Severe 3 vessel obstructive CAD 2. Normal LV function. 3. Normal right heart pressures and LV filling pressures.  Recommendations: The patient's volume status looks good now. Would consider trial of weaning from vent. Need to consider options for revascularization. Will consult CT surgery for their opinion. From an interventional standpoint I think the LAD and RCA lesions could be stented. The LCx/OM lesions are not well suited to PCI given bifurcation disease and smaller caliber vessels. Will hold Plavix now until decision is finalized.    Demetria Poreeter M [facility] CenterJordanMD,FACC 07/12/2013, 2:22 PM

## 2013-07-12 NOTE — Progress Notes (Signed)
Day of Surgery Procedure(s) (LRB): LEFT AND RIGHT HEART CATHETERIZATION WITH CORONARY ANGIOGRAM (N/A) Subjective: Patient examined, left and right heart catheterization and echocardiogram reviewed  Chronically ill 80 year old man on hemodialysis recently intubated for poor edema felt after dialysis. Cardiac enzymes have been increasing. Cardiac catheterization performed today after dialysis demonstrates 90% proximal LAD stenosis and 90% proximal RCA stenosis. EF mildly reduced. Right heart cath shows normal wedge, normal filling pressures. Sputum by her titers are negative. Patient currently on ventilator after being reintubated after he self extubated within the past 24 hours. He did not tolerate extubation even briefly. Chest x-ray shows bilateral interstitial airspace disease consistent with ARDS. Blood pressure stable off pressor worse Patient is status post left femoropopliteal bypass. Right leg exam shows poor quality saphenous vein. Objective: Vital signs in last 24 hours: Temp:  [97.6 F (36.4 C)-99.8 F (37.7 C)] 99 F (37.2 C) (04/08 1544) Pulse Rate:  [56-150] 75 (04/08 1945) Cardiac Rhythm:  [-] Normal sinus rhythm (04/08 1200) Resp:  [16-24] 16 (04/08 2015) BP: (76-152)/(24-60) 134/38 mmHg (04/08 2015) SpO2:  [72 %-100 %] 100 % (04/08 1945) FiO2 (%):  [40 %] 40 % (04/08 2000) Weight:  [135 lb 2.3 oz (61.3 kg)-135 lb 9.3 oz (61.5 kg)] 135 lb 2.3 oz (61.3 kg) (04/08 0700)  Hemodynamic parameters for last 24 hours:   afebrile sinus rhythm  Intake/Output from previous day: 04/07 0701 - 04/08 0700 In: 660.7 [I.V.:560.7; NG/GT:50; IV Piggyback:50] Out: 2450  Intake/Output this shift:   Patient sedated on ventilator, malnourished and chronically ill Neck withoutt JVD or palpable mass Lungs with scattered rales Heart rhythm regular without murmur Atrophic skin changes in both lower Chairman is Lab Results:  Recent Labs  07/10/13 0500 07/11/13 0246  WBC 12.8* 12.2*  HGB  9.2* 10.6*  HCT 28.1* 31.6*  PLT 221 PLATELET CLUMPS NOTED ON SMEAR, COUNT APPEARS ADEQUATE   BMET:  Recent Labs  07/11/13 0246 07/12/13 0255  NA 135* 141  K 5.3 4.1  CL 89* 97  CO2 27 24  GLUCOSE 138* 122*  BUN 32* 25*  CREATININE 6.18* 5.16*  CALCIUM 8.2* 8.3*    PT/INR:  Recent Labs  07/12/13 0030  LABPROT 14.8  INR 1.19   ABG    Component Value Date/Time   PHART 7.521* 07/12/2013 1359   HCO3 29.7* 07/12/2013 1402   TCO2 31 07/12/2013 1402   O2SAT 68.0 07/12/2013 1402   CBG (last 3)   Recent Labs  07/12/13 1124 07/12/13 1525 07/12/13 1904  GLUCAP 193* 143* 147*    Assessment/Plan: S/P Procedure(s) (LRB): LEFT AND RIGHT HEART CATHETERIZATION WITH CORONARY ANGIOGRAM (N/A) 80 year old dialysis chronically ill patient intubated with ARDS-edema and three-vessel CAD. With his acute lung disease and hemodialysis-dependent renal failure he would not survive multivessel CABG. Recommend consideration of PCI for therapy of CAD.   LOS: 3 days    Kathlee Nationseter Van [facility] 07/12/2013

## 2013-07-12 NOTE — Progress Notes (Signed)
PULMONARY / CRITICAL CARE MEDICINE   Name: [name] MRN: [id number] DOB: 1934-03-03    ADMISSION DATE:  07/09/2013 CONSULTATION DATE:  4/5 PRIMARY SERVICE: PCCM  CHIEF COMPLAINT:  SOB and abdominal pain  BRIEF PATIENT DESCRIPTION:   Patient is a 66 man with acute onset SOB with history of ESRD on HD (MWF), PVD (s/p left fempop) & DM on insulin. Admitted for abdominal pain and SOB. Per [facility] chart review, patient has been without fever and cough.   He had a similar episode of SOB in December at [place], told it was not HF, and thought there was something wrong with his lungs. The  patient is constantly sleepy since Dec.  He has had similar SOB prior to Dec, thought to be due to heart failure. To note, patient lives alone. He has 5 children, but only speaks with 2, a daughter in [place] [facility], [phone] - primary contact) and son in [place].     SIGNIFICANT EVENTS / STUDIES:   07/09/13- CXR in [facility]: B/l pulm edema (CD with image in chart) 4/5-CXR: underlying congestive heart failure, airspace consolidation in both bases.  4/6-CXR: worsening of airspace disease right lung base.  4/6- Did HD 4/7-CXR: No significant change of airspace disease  4/7-had chest pain and leleved trop-->started heparin gtt 4/7-reintubated, chest pain, pos trop, NSTEMI  LINES / TUBES: ETT 07/09/13 >>>4/7 AM(self extubated) Re-ETT 4/7>> 4/7 Rt IJ>>>   CULTURES: 07/09/13 Blood Cx x2 >>> 07/09/13 Trach Aspiriate >>> 07/09/13 Resp Virus PCR Panel >>  ANTIBIOTICS: Vanc 4/5 >>>4/6 Zosyn 4/5 >>>4/6  SUBJECTIVE:  The patient is sedated and intubated. No fever.   VITAL SIGNS: Temp:  [97.6 F (36.4 C)-99.5 F (37.5 C)] 99.1 F (37.3 C) (04/08 0422) Pulse Rate:  [50-107] 68 (04/08 0715) Resp:  [10-25] 17 (04/08 0715) BP: (80-184)/(27-108) 126/49 mmHg (04/08 0715) SpO2:  [72 %-100 %] 99 % (04/08 0715) FiO2 (%):  [40 %] 40 % (04/08 0325) Weight:  [135 lb 2.3 oz (61.3 kg)-135 lb 9.3  oz (61.5 kg)] 135 lb 2.3 oz (61.3 kg) (04/08 0700) HEMODYNAMICS:   VENTILATOR SETTINGS: Vent Mode:  [-] PRVC FiO2 (%):  [40 %] 40 % Set Rate:  [14 bmp-16 bmp] 16 bmp Vt Set:  [500 mL] 500 mL PEEP:  [5 cmH20] 5 cmH20 Pressure Support:  [5 cmH20] 5 cmH20 Plateau Pressure:  [18 cmH20-22 cmH20] 18 cmH20 INTAKE / OUTPUT: Intake/Output     04/07 0701 - 04/08 0700 04/08 0701 - 04/09 0700   I.V. (mL/kg) 524.7 (8.6)    NG/GT 50    IV Piggyback 50    Total Intake(mL/kg) 624.7 (10.2)    Other 2450    Total Output 2450     Net -1825.3            PHYSICAL EXAMINATION: General: intubated and sedated HEENT: PERRL (sluggish), EOMI, no scleral icterus, dentures in place Cardiac: RRR, no rubs, murmurs or gallops Pulm: decreased air mvmt bilaterally with coarse upper airway noise Abd: soft, nontender, nondistended, BS normoactive Ext: warm and well perfused, no pedal edema, LUE dialysis access with +thrill Neuro: responsive to sternal rub.  LABS:  CBC  Recent Labs Lab 07/10/13 0500 07/11/13 0246  WBC 12.8* 12.2*  HGB 9.2* 10.6*  HCT 28.1* 31.6*  PLT 221 PLATELET CLUMPS NOTED ON SMEAR, COUNT APPEARS ADEQUATE   Coag's  Recent Labs Lab 07/12/13 0030  APTT 46*  INR 1.19   BMET  Recent Labs Lab 07/10/13 0525 07/11/13 0246  NA 143 135*  K 5.3 5.3  CL 99 89*  CO2 24 27  BUN 63* 32*  CREATININE 10.32* 6.18*  GLUCOSE 44* 138*   Electrolytes  Recent Labs Lab 07/10/13 0525  07/11/13 0055 07/11/13 0246 07/11/13 1940 07/11/13 2200  CALCIUM 8.4  --   --  8.2*  --   --   MG 2.4  < > 1.9  --  2.1 2.1  PHOS 2.7  < > 4.8*  --  2.5 4.0  < > = values in this interval not displayed. Sepsis Markers  Recent Labs Lab 07/09/13 1900  LATICACIDVEN 1.8  PROCALCITON 4.39   ABG  Recent Labs Lab 07/09/13 1611 07/11/13 1727  PHART 7.424 7.348*  PCO2ART 41.1 49.7*  PO2ART 68.0* 83.0   Liver Enzymes  Recent Labs Lab 07/11/13 1940  AST 36  ALT 20  ALKPHOS 85   BILITOT 0.5  ALBUMIN 3.8   Cardiac Enzymes  Recent Labs Lab 07/10/13 0525 07/10/13 0910 07/11/13 1537 07/11/13 2200 07/12/13 0255  TROPONINI <0.30  --  0.80* 5.35* 9.94*  PROBNP  --  54473.0*  --   --   --    Glucose  Recent Labs Lab 07/11/13 0411 07/11/13 0814 07/11/13 1139 07/11/13 1924 07/11/13 2325 07/12/13 0344  GLUCAP 108* 108* 135* 119* 129* 103*    Imaging Dg Chest Port 1 View  07/11/2013   CLINICAL DATA:  Shortness of breath.  EXAM: PORTABLE CHEST - 1 VIEW  COMPARISON:  Portable film earlier in the day.  FINDINGS: ET tube 3.9 cm above carina. Central venous catheter tip from right IJ approach lies at the SVC RA junction. The heart is enlarged. There is widespread bilateral pulmonary opacities consistent with pulmonary edema. Bilateral effusions. Worsening aeration compared with priors. No visible pneumothorax.  IMPRESSION: Worsening aeration.  Probable developing pulmonary edema.   Electronically Signed   By: Davonna BellingJohn  Curnes M.D.   On: 07/11/2013 17:07   Dg Chest Port 1 View  07/11/2013   CLINICAL DATA:  Chest pain.  Endotracheal tube position  EXAM: PORTABLE CHEST - 1 VIEW  COMPARISON:  07/11/2013  FINDINGS: Endotracheal tube in good position.  NG tube has been removed.  Improvement in edema and bilateral effusions since earlier today. There remains mild edema and small effusions. Improvement in bibasilar atelectasis.  IMPRESSION: Endotracheal tube in good position. Improvement in pulmonary edema and bilateral effusions.   Electronically Signed   By: Marlan Palauharles  Clark M.D.   On: 07/11/2013 15:50   Dg Chest Port 1 View  07/11/2013   CLINICAL DATA:  Pneumonia  EXAM: PORTABLE CHEST - 1 VIEW  COMPARISON:  07/10/2013  FINDINGS: The endotracheal tube is noted in satisfactory position. The nasogastric catheter is seen within the stomach. The cardiac shadow is stable. Bilateral pleural effusions are again identified. Vascular congestion is again seen.  IMPRESSION: The overall  appearance has worsened in the interval with increasing bilateral pleural effusions.   Electronically Signed   By: Alcide CleverMark  Lukens M.D.   On: 07/11/2013 08:10    ASSESSMENT / PLAN:  PULMONARY A: Acute Hypoxemic Respiratory failure with pulm edema in setting of ESRD, initially on BiPap, intubated for airway protection; possible healthcare associated PNA; though acute, less likely PE given history/CXR findings (Wells criteria 0) -Procalcitonin 4.39 -Improving  P:   -keep same MV, ph reviewed -hold weaning with concerns active ischemia -see ID -pcxr in am Cath then decide on weaning  CARDIOVASCULAR A: No evidence ischemia, r/o occult valvular dz,  h/o HTN, PVD - chest pain with elevated trop, No ST elevation  P:   -Cont ASA, Plavix, Coreg per tube -Heparin gtt -start lipitor per tube -2D echo pending -follow BP with HD -would rec immediate cath -consider rt heart cath also  RENAL A:  ESRD on HD P:   -doing HD today -cont Fosrenal per tube -kvo  GASTROINTESTINAL A: NO ABDO PAIN noted, LFT normal P:    -Famotidine - GI ppx -npo for cath  HEMATOLOGIC A:  DVT prev P:  -Monitor cb cin am  -sub q hep  INFECTIOUS A:  No evidence infection P:    -dc all abx, observe  ENDOCRINE A:  DM   P:   -ICU Hyperglycemia protocol  NEUROLOGIC A:  Acute Encephalopathy - uremia vs benzo (?lorazepam at [place]), not hypercarbic P:   -HD  -fent -[facility]  Lorretta Harp, MD PGY3, Internal Medicine Teaching Service Pager: [phone]   TODAY'S SUMMARY:not tolerating propofol, add fent, feed, HD, for cath  I have personally obtained a history, examined the patient, evaluated laboratory and imaging results, formulated the assessment and plan and placed orders. CRITICAL CARE: The patient is critically ill with multiple organ systems failure and requires high complexity decision making for assessment and support, frequent evaluation and titration of therapies, application of advanced  monitoring technologies and extensive interpretation of multiple databases. Critical Care Time devoted to patient care services described in this note is 30 minutes.   Mcarthur Rossetti. Tyson Alias, MD, FACP Pgr: [phone] [facility]  Pulmonary and Critical Care Medicine [facility] Pager: [phone]  07/12/2013, 7:34 AM

## 2013-07-12 NOTE — Interval H&P Note (Signed)
History and Physical Interval Note:  07/12/2013 1:37 PM  [name]  has presented today for surgery, with the diagnosis of cp  The various methods of treatment have been discussed with the patient and family. After consideration of risks, benefits and other options for treatment, the patient has consented to  Procedure(s): LEFT AND RIGHT HEART CATHETERIZATION WITH CORONARY ANGIOGRAM (N/A) as a surgical intervention .  The patient's history has been reviewed, patient examined, no change in status, stable for surgery.  I have reviewed the patient's chart and labs.  Questions were answered to the patient's satisfaction.   Cath Lab Visit (complete for each Cath Lab visit)  Clinical Evaluation Leading to the Procedure:   ACS: yes  Non-ACS:    Anginal Classification: CCS IV  Anti-ischemic medical therapy: No Therapy  Non-Invasive Test Results: No non-invasive testing performed  Prior CABG: No previous CABG        Demetria Poreeter M Stra[facility] Hospital For Special SurgeryJordanMD,FACC 07/12/2013 1:37 PM

## 2013-07-12 NOTE — H&P (View-Only) (Signed)
Cardiology Consultation  [name]    [id number] 11-11-1933  Reason for Consult: NSTEMI with Troponin elevation  Requesting Physician: Titus Mould  Primary Cardiologist: new  HPI:The patient is a 80 y.o. year-old with hx of HTN, DM, PAD (s/p left fem-pop) and ESRD on HD starting since around March 2010. Patient presented to [facility] with SOB and abd pain. CXR showed diffuse bilat pulm edema. Pt intubated and transferred to [facility]. Initial Troponins were negative; BNP 54473. He has been undergoing dialysis on 4/6,7 and today. Yesterday, patient self extubated himself, then later developed marked respiratory distress, complained of chest pain  and required re-intubation. Troponin today has increased to 9.94 without acute ST elevation c/w NSTEMI.  Cardiology consult requested.  No history obtainable from patient who is currently sedated on ventilator.    Past Medical History  Diagnosis Date  . Diabetes mellitus   . Hypertension   . Hyperlipidemia   . Glaucoma   . Cataract   . Peripheral vascular disease   . ESRD on dialysis    Past Surgical History  Procedure Laterality Date  . Pr vein bypass graft,aorto-fem-pop  02/20/2010    left fem pop with saphenous vein  . Av fistula placement  10/24/2009    left    FAMHx: Family History  Problem Relation Age of Onset  . Heart disease Father     Heart Disease before age 58  . Diabetes Father   . Hyperlipidemia Father   . Hypertension Father   . Hyperlipidemia Mother   . Hypertension Mother   . Heart disease Mother     Heart Disease before age 52  . Diabetes Brother   . Hyperlipidemia Brother   . Hypertension Brother     SOCHx:  reports that he quit smoking about 37 years ago. He quit smokeless tobacco use about 26 years ago. He reports that he does not drink alcohol or use illicit drugs.  ALLERGIES: No Known Allergies  ROS: chest pain, dyspnea and congestive heart failure In addition a comprehensive review of  systems was negative except for PVD s/p left fem-pop, ESRD, DM, HTN, hyperlipidemia,airspace disease, cataract,glaucoma.  HOME MEDICATIONS: Prescriptions prior to admission  Medication Sig Dispense Refill  . aspirin 81 MG tablet Take 81 mg by mouth daily.      . carvedilol (COREG) 3.125 MG tablet Take 3.125 mg by mouth See admin instructions. Takes 1 tablet daily after dialysis on Mon, Wed, and Fri.  Take 1 tablet twice a day the rest of the week.      . clopidogrel (PLAVIX) 75 MG tablet Take 75 mg by mouth daily.      Marland Kitchen ezetimibe-simvastatin (VYTORIN) 10-80 MG per tablet Take 0.5 tablets by mouth daily.      . isosorbide mononitrate (IMDUR) 30 MG 24 hr tablet Take 30 mg by mouth daily.       . multivitamin (RENA-VIT) TABS tablet Take 1 tablet by mouth daily.      Marland Kitchen NOVOLOG MIX 70/30 (70-30) 100 UNIT/ML injection Inject 10 Units into the skin Twice daily.         HOSPITAL MEDICATIONS: Scheduled: . antiseptic oral rinse  15 mL Mouth Rinse QID  . aspirin EC  81 mg Oral Daily  . atorvastatin  40 mg Oral q1800  . chlorhexidine  15 mL Mouth Rinse BID  . clopidogrel  75 mg Per Tube Q breakfast  . darbepoetin      . darbepoetin (ARANESP) injection - DIALYSIS  40 mcg Intravenous  Q Mon-HD  . famotidine (PEPCID) IV  20 mg Intravenous Daily  . feeding supplement (NEPRO CARB STEADY)  1,000 mL Per Tube Q24H  . feeding supplement (PRO-STAT SUGAR FREE 64)  30 mL Per Tube BID  . ferric gluconate (FERRLECIT/NULECIT) IV  125 mg Intravenous Q M,W,F-HD  . heparin  2,000 Units Dialysis Once in dialysis  . heparin  2,000 Units Dialysis Once in dialysis  . insulin aspart  2-6 Units Subcutaneous 6 times per day  . lanthanum  1,000 mg Per Tube TID WC  . mupirocin ointment        VITALS: Blood pressure 92/44, pulse 77, temperature 99.8 F (37.7 C), temperature source Oral, resp. rate 16, height 5' 4"  (1.626 m), weight 135 lb 2.3 oz (61.3 kg), SpO2 100.00%.  PHYSICAL EXAM: General appearance: sedated;  intubated on vent Neck: no adenopathy, no carotid bruit, supple, symmetrical, trachea midline, thyroid not enlarged, symmetric, no tenderness/mass/nodules  Lungs: no wheezing; decreased BS Heart: regular rate and rhythm and rare ectopic complex Abdomen: soft, non-tender; bowel sounds normal; no masses,  no organomegaly Extremities: extremities normal, atraumatic, no cyanosis or edema Pulses: 2+ and symmetric LUE dialysis access Skin: Skin color, texture, turgor normal. No rashes or lesions Neurologic: responsive to sternal rub   ECG:  NSR with nonspecific ST changes  LABS: Results for orders placed during the hospital encounter of 07/09/13 (from the past 48 hour(s))  MAGNESIUM     Status: None   Collection Time    07/10/13 10:05 AM      Result Value Ref Range   Magnesium 2.5  1.5 - 2.5 mg/dL  PHOSPHORUS     Status: None   Collection Time    07/10/13 10:05 AM      Result Value Ref Range   Phosphorus 3.4  2.3 - 4.6 mg/dL  GLUCOSE, CAPILLARY     Status: None   Collection Time    07/10/13 11:48 AM      Result Value Ref Range   Glucose-Capillary 97  70 - 99 mg/dL  RESPIRATORY VIRUS PANEL     Status: None   Collection Time    07/10/13 12:06 PM      Result Value Ref Range   Source - RVPAN TRACHEAL ASPIRATE     Respiratory Syncytial Virus A NOT DETECTED     Respiratory Syncytial Virus B NOT DETECTED     Influenza A NOT DETECTED     Influenza B NOT DETECTED     Parainfluenza 1 NOT DETECTED     Parainfluenza 2 NOT DETECTED     Parainfluenza 3 NOT DETECTED     Metapneumovirus NOT DETECTED     Rhinovirus NOT DETECTED     Adenovirus NOT DETECTED     Influenza A H1 NOT DETECTED     Influenza A H3 NOT DETECTED     Comment: (NOTE)           Normal Reference Range for each Analyte: NOT DETECTED     Testing performed using the Luminex xTAG Respiratory Viral Panel test     kit.     This test was developed and its performance characteristics determined     by [employer]. It  has not been cleared or approved by the [place]     Food and Drug Administration. This test is used for clinical purposes.     It should not be regarded as investigational or for research. This     laboratory is certified under  the Clinical Laboratory Improvement     Amendments of 1988 (CLIA) as qualified to perform high complexity     clinical laboratory testing.     Performed at [place], [place]     Status: Abnormal   Collection Time    07/10/13  3:38 PM      Result Value Ref Range   Glucose-Capillary 173 (*) 70 - 99 mg/dL  GLUCOSE, CAPILLARY     Status: None   Collection Time    07/10/13  7:13 PM      Result Value Ref Range   Glucose-Capillary 95  70 - 99 mg/dL  GLUCOSE, CAPILLARY     Status: Abnormal   Collection Time    07/11/13 12:00 AM      Result Value Ref Range   Glucose-Capillary 154 (*) 70 - 99 mg/dL   Comment 1 Documented in Chart     Comment 2 Notify RN    MAGNESIUM     Status: None   Collection Time    07/11/13 12:55 AM      Result Value Ref Range   Magnesium 1.9  1.5 - 2.5 mg/dL  PHOSPHORUS     Status: Abnormal   Collection Time    07/11/13 12:55 AM      Result Value Ref Range   Phosphorus 4.8 (*) 2.3 - 4.6 mg/dL  CBC     Status: Abnormal   Collection Time    07/11/13  2:46 AM      Result Value Ref Range   WBC 12.2 (*) 4.0 - 10.5 K/uL   Comment: WHITE COUNT CONFIRMED ON SMEAR   RBC 3.25 (*) 4.22 - 5.81 MIL/uL   Hemoglobin 10.6 (*) 13.0 - 17.0 g/dL   HCT 31.6 (*) 39.0 - 52.0 %   MCV 97.2  78.0 - 100.0 fL   MCH 32.6  26.0 - 34.0 pg   MCHC 33.5  30.0 - 36.0 g/dL   RDW 14.9  11.5 - 15.5 %   Platelets    150 - 400 K/uL   Value: PLATELET CLUMPS NOTED ON SMEAR, COUNT APPEARS ADEQUATE  BASIC METABOLIC PANEL     Status: Abnormal   Collection Time    07/11/13  2:46 AM      Result Value Ref Range   Sodium 135 (*) 137 - 147 mEq/L   Comment: DELTA CHECK NOTED   Potassium 5.3  3.7 - 5.3 mEq/L   Comment: HEMOLYSIS AT THIS LEVEL MAY AFFECT  RESULT   Chloride 89 (*) 96 - 112 mEq/L   CO2 27  19 - 32 mEq/L   Glucose, Bld 138 (*) 70 - 99 mg/dL   BUN 32 (*) 6 - 23 mg/dL   Comment: DELTA CHECK NOTED   Creatinine, Ser 6.18 (*) 0.50 - 1.35 mg/dL   Comment: DELTA CHECK NOTED   Calcium 8.2 (*) 8.4 - 10.5 mg/dL   GFR calc non Af Amer 8 (*) >90 mL/min   GFR calc Af Amer 9 (*) >90 mL/min   Comment: (NOTE)     The eGFR has been calculated using the CKD EPI equation.     This calculation has not been validated in all clinical situations.     eGFR's persistently <90 mL/min signify possible Chronic Kidney     Disease.  GLUCOSE, CAPILLARY     Status: Abnormal   Collection Time    07/11/13  4:11 AM      Result Value Ref Range   Glucose-Capillary  108 (*) 70 - 99 mg/dL   Comment 1 Documented in Chart     Comment 2 Notify RN    GLUCOSE, CAPILLARY     Status: Abnormal   Collection Time    07/11/13  8:14 AM      Result Value Ref Range   Glucose-Capillary 108 (*) 70 - 99 mg/dL  GLUCOSE, CAPILLARY     Status: Abnormal   Collection Time    07/11/13 11:39 AM      Result Value Ref Range   Glucose-Capillary 135 (*) 70 - 99 mg/dL  TROPONIN I     Status: Abnormal   Collection Time    07/11/13  3:37 PM      Result Value Ref Range   Troponin I 0.80 (*) <0.30 ng/mL   Comment:            Due to the release kinetics of cTnI,     a negative result within the first hours     of the onset of symptoms does not rule out     myocardial infarction with certainty.     If myocardial infarction is still suspected,     repeat the test at appropriate intervals.     CRITICAL RESULT CALLED TO, READ BACK BY AND VERIFIED WITH:     A MINOR,RN 07/11/13 1732 SHIPMAN M  POCT I-STAT 3, BLOOD GAS (G3+)     Status: Abnormal   Collection Time    07/11/13  5:27 PM      Result Value Ref Range   pH, Arterial 7.348 (*) 7.350 - 7.450   pCO2 arterial 49.7 (*) 35.0 - 45.0 mmHg   pO2, Arterial 83.0  80.0 - 100.0 mmHg   Bicarbonate 27.3 (*) 20.0 - 24.0 mEq/L   TCO2  29  0 - 100 mmol/L   O2 Saturation 95.0     Acid-Base Excess 1.0  0.0 - 2.0 mmol/L   Patient temperature 98.6 F     Collection site BRACHIAL ARTERY     Drawn by Operator     Sample type ARTERIAL    GLUCOSE, CAPILLARY     Status: Abnormal   Collection Time    07/11/13  7:24 PM      Result Value Ref Range   Glucose-Capillary 119 (*) 70 - 99 mg/dL  MAGNESIUM     Status: None   Collection Time    07/11/13  7:40 PM      Result Value Ref Range   Magnesium 2.1  1.5 - 2.5 mg/dL  PHOSPHORUS     Status: None   Collection Time    07/11/13  7:40 PM      Result Value Ref Range   Phosphorus 2.5  2.3 - 4.6 mg/dL  HEPATIC FUNCTION PANEL     Status: None   Collection Time    07/11/13  7:40 PM      Result Value Ref Range   Total Protein 7.9  6.0 - 8.3 g/dL   Albumin 3.8  3.5 - 5.2 g/dL   AST 36  0 - 37 U/L   ALT 20  0 - 53 U/L   Alkaline Phosphatase 85  39 - 117 U/L   Total Bilirubin 0.5  0.3 - 1.2 mg/dL   Bilirubin, Direct <0.2  0.0 - 0.3 mg/dL   Indirect Bilirubin NOT CALCULATED  0.3 - 0.9 mg/dL  TROPONIN I     Status: Abnormal   Collection Time    07/11/13 10:00 PM  Result Value Ref Range   Troponin I 5.35 (*) <0.30 ng/mL   Comment:            Due to the release kinetics of cTnI,     a negative result within the first hours     of the onset of symptoms does not rule out     myocardial infarction with certainty.     If myocardial infarction is still suspected,     repeat the test at appropriate intervals.     CRITICAL RESULT CALLED TO, READ BACK BY AND VERIFIED WITH:     Rip Harbour RN [phone] GREEN R  MAGNESIUM     Status: None   Collection Time    07/11/13 10:00 PM      Result Value Ref Range   Magnesium 2.1  1.5 - 2.5 mg/dL  PHOSPHORUS     Status: None   Collection Time    07/11/13 10:00 PM      Result Value Ref Range   Phosphorus 4.0  2.3 - 4.6 mg/dL  GLUCOSE, CAPILLARY     Status: Abnormal   Collection Time    07/11/13 11:25 PM      Result Value Ref Range    Glucose-Capillary 129 (*) 70 - 99 mg/dL   Comment 1 Notify RN    APTT     Status: Abnormal   Collection Time    07/12/13 12:30 AM      Result Value Ref Range   aPTT 46 (*) 24 - 37 seconds   Comment:            IF BASELINE aPTT IS ELEVATED,     SUGGEST PATIENT RISK ASSESSMENT     BE USED TO DETERMINE APPROPRIATE     ANTICOAGULANT THERAPY.  PROTIME-INR     Status: None   Collection Time    07/12/13 12:30 AM      Result Value Ref Range   Prothrombin Time 14.8  11.6 - 15.2 seconds   INR 1.19  0.00 - 1.49  HEPARIN LEVEL (UNFRACTIONATED)     Status: Abnormal   Collection Time    07/12/13 12:30 AM      Result Value Ref Range   Heparin Unfractionated <0.10 (*) 0.30 - 0.70 IU/mL   Comment:            IF HEPARIN RESULTS ARE BELOW     EXPECTED VALUES, AND PATIENT     DOSAGE HAS BEEN CONFIRMED,     SUGGEST FOLLOW UP TESTING     OF ANTITHROMBIN III LEVELS.  TROPONIN I     Status: Abnormal   Collection Time    07/12/13  2:55 AM      Result Value Ref Range   Troponin I 9.94 (*) <0.30 ng/mL   Comment:            Due to the release kinetics of cTnI,     a negative result within the first hours     of the onset of symptoms does not rule out     myocardial infarction with certainty.     If myocardial infarction is still suspected,     repeat the test at appropriate intervals.     CRITICAL VALUE NOTED.  VALUE IS CONSISTENT WITH PREVIOUSLY REPORTED AND CALLED VALUE.  BASIC METABOLIC PANEL     Status: Abnormal   Collection Time    07/12/13  2:55 AM      Result Value Ref Range  Sodium 141  137 - 147 mEq/L   Potassium 4.1  3.7 - 5.3 mEq/L   Comment: DELTA CHECK NOTED   Chloride 97  96 - 112 mEq/L   CO2 24  19 - 32 mEq/L   Glucose, Bld 122 (*) 70 - 99 mg/dL   BUN 25 (*) 6 - 23 mg/dL   Creatinine, Ser 5.16 (*) 0.50 - 1.35 mg/dL   Calcium 8.3 (*) 8.4 - 10.5 mg/dL   GFR calc non Af Amer 10 (*) >90 mL/min   GFR calc Af Amer 11 (*) >90 mL/min   Comment: (NOTE)     The eGFR has been  calculated using the CKD EPI equation.     This calculation has not been validated in all clinical situations.     eGFR's persistently <90 mL/min signify possible Chronic Kidney     Disease.  GLUCOSE, CAPILLARY     Status: Abnormal   Collection Time    07/12/13  3:44 AM      Result Value Ref Range   Glucose-Capillary 103 (*) 70 - 99 mg/dL   Comment 1 Notify RN    GLUCOSE, CAPILLARY     Status: Abnormal   Collection Time    07/12/13  8:04 AM      Result Value Ref Range   Glucose-Capillary 106 (*) 70 - 99 mg/dL    IMAGING: Dg Chest Port 1 View  07/11/2013   CLINICAL DATA:  Shortness of breath.  EXAM: PORTABLE CHEST - 1 VIEW  COMPARISON:  Portable film earlier in the day.  FINDINGS: ET tube 3.9 cm above carina. Central venous catheter tip from right IJ approach lies at the SVC RA junction. The heart is enlarged. There is widespread bilateral pulmonary opacities consistent with pulmonary edema. Bilateral effusions. Worsening aeration compared with priors. No visible pneumothorax.  IMPRESSION: Worsening aeration.  Probable developing pulmonary edema.   Electronically Signed   By: Rolla Flatten M.D.   On: 07/11/2013 17:07   Dg Chest Port 1 View  07/11/2013   CLINICAL DATA:  Chest pain.  Endotracheal tube position  EXAM: PORTABLE CHEST - 1 VIEW  COMPARISON:  07/11/2013  FINDINGS: Endotracheal tube in good position.  NG tube has been removed.  Improvement in edema and bilateral effusions since earlier today. There remains mild edema and small effusions. Improvement in bibasilar atelectasis.  IMPRESSION: Endotracheal tube in good position. Improvement in pulmonary edema and bilateral effusions.   Electronically Signed   By: Franchot Gallo M.D.   On: 07/11/2013 15:50   Dg Chest Port 1 View  07/11/2013   CLINICAL DATA:  Pneumonia  EXAM: PORTABLE CHEST - 1 VIEW  COMPARISON:  07/10/2013  FINDINGS: The endotracheal tube is noted in satisfactory position. The nasogastric catheter is seen within the stomach.  The cardiac shadow is stable. Bilateral pleural effusions are again identified. Vascular congestion is again seen.  IMPRESSION: The overall appearance has worsened in the interval with increasing bilateral pleural effusions.   Electronically Signed   By: Inez Catalina M.D.   On: 07/11/2013 08:10    IMPRESSION:  1. NSTEM with trop increasing from nl to 5.35 yesterday and 9.94 without acute ST elevation 2. Pulmonary edema 3. Respiratory failure requiring re-intubation yesterday 4. ESRD currently undergoing dialysis for 3 rd consecutive day.\ 5. PVD. S/p left fem-pop 6. DM   Discussed with Dr. Titus Mould. With chest pain yesterday and recurrent respiratory distress may be best to do R and L heart cath today while intubated  and after dialysis completed. Will need to discuss with family since consent not able to be obtained from patient.  Attending:  Troy Sine, MD, [facility] 07/12/2013 9:17 AM

## 2013-07-12 NOTE — Progress Notes (Signed)
7781f sheath removed from rt. Femoral artery @ 1800.  4270f sheath removed from rt femoral vein @ 1810.  Total manual pressure was applied to rt groin for 20 min.  Rt. Pt pulse doppled before and after sheath pull.  BP  145/50, HR 70's.  Pt. Is restrained, intubated and sedated, so post sheath removal instructions were not given.  Rt. Groin site is level 0, and bedrest starts at 18:20.  There were no complications.

## 2013-07-12 NOTE — Progress Notes (Signed)
ANTICOAGULATION CONSULT NOTE - Initial Consult  Pharmacy Consult for Heparin Indication: chest pain/ACS  No Known Allergies  Patient Measurements: Height: 5\' 4"  (162.6 cm) Weight: 143 lb 15.4 oz (65.3 kg) IBW/kg (Calculated) : 59.2  Vital Signs: Temp: 97.6 F (36.4 C) (04/08 0010) Temp src: Oral (04/08 0010) BP: 91/37 mmHg (04/07 2339) Pulse Rate: 65 (04/07 2339)  Labs:  Recent Labs  07/10/13 0500 07/10/13 0525 07/11/13 0246 07/11/13 1537 07/11/13 2200  HGB 9.2*  --  10.6*  --   --   HCT 28.1*  --  31.6*  --   --   PLT 221  --  PLATELET CLUMPS NOTED ON SMEAR, COUNT APPEARS ADEQUATE  --   --   CREATININE  --  10.32* 6.18*  --   --   TROPONINI  --  <0.30  --  0.80* 5.35*    Estimated Creatinine Clearance: 8.4 ml/min (by C-G formula based on Cr of 6.18).   Medical History: Past Medical History  Diagnosis Date  . Diabetes mellitus   . Hypertension   . Hyperlipidemia   . Glaucoma   . Cataract   . Peripheral vascular disease   . ESRD on dialysis     Medications:  Prescriptions prior to admission  Medication Sig Dispense Refill  . aspirin 81 MG tablet Take 81 mg by mouth daily.      . carvedilol (COREG) 3.125 MG tablet Take 3.125 mg by mouth See admin instructions. Takes 1 tablet daily after dialysis on Mon, Wed, and Fri.  Take 1 tablet twice a day the rest of the week.      . clopidogrel (PLAVIX) 75 MG tablet Take 75 mg by mouth daily.      Marland Kitchen. ezetimibe-simvastatin (VYTORIN) 10-80 MG per tablet Take 0.5 tablets by mouth daily.      . isosorbide mononitrate (IMDUR) 30 MG 24 hr tablet Take 30 mg by mouth daily.       . multivitamin (RENA-VIT) TABS tablet Take 1 tablet by mouth daily.      Marland Kitchen. NOVOLOG MIX 70/30 (70-30) 100 UNIT/ML injection Inject 10 Units into the skin Twice daily.         Assessment: 80 yo male with SOB, elevated cardiac markers, for heparin  Goal of Therapy:  Heparin level 0.3-0.7 units/ml Monitor platelets by anticoagulation protocol: Yes    Plan:  D/C SQ heparin Heparin 2000 units IV bolus, then 900 units/hr Check heparin level in 8 hours.  Eddie Candlebbott, Jene Oravec [place] 07/12/2013,12:18 AM

## 2013-07-12 NOTE — Progress Notes (Signed)
No SBT/wean due to pt just reintubated yesterday evening, less than 24 hours ago. Pt currently receiving dialysis at this time. No complications noted. RT will monitor.

## 2013-07-12 NOTE — Progress Notes (Signed)
[facility] Progress Note   Subjective: On vent, to have heart cath today  Filed Vitals:   07/12/13 1015 07/12/13 1030 07/12/13 1045 07/12/13 1100  BP: 128/49 108/45 127/53 112/38  Pulse: 79 87 80 75  Temp:      TempSrc:      Resp: 17 18 16 16   Height:      Weight:      SpO2: 100% 100% 100% 100%   Exam: On vent +JVD  Chest bilat occ rhonchi  RRR 2/6 SEM no RG  Abd soft, NTND  LE trace edema bilat  LUA AVF patent +bruit  Neuro sedated on vent  ECHO - no old echo on chart  Dialysis: MWF [address]  3h 15min 67kg 2/2.5 Bath P4 LUA AVF Heparin 2000  Hectorol none Epo 6200 Venofer 100 /wk on Wed  Assessment:  1 Acute resp failure / pulm edema- improving on cxr today, still on vent 2 NSTEMI for heart cath later today 3 ESRD 4 HTN/volume- on no BP meds currently 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  6 Anemia cont esa w darbe 40/wk  7 PAD hx L fempop  7 DM on insulin  Plan- HD today, lower dry wt    Vinson Moselleob Edessa Jakubowicz MD  pager [phone]    cell [phone]  07/12/2013, 11:34 AM     Recent Labs Lab 07/10/13 0525  07/11/13 0055 07/11/13 0246 07/11/13 1940 07/11/13 2200 07/12/13 0255  NA 143  --   --  135*  --   --  141  K 5.3  --   --  5.3  --   --  4.1  CL 99  --   --  89*  --   --  97  CO2 24  --   --  27  --   --  24  GLUCOSE 44*  --   --  138*  --   --  122*  BUN 63*  --   --  32*  --   --  25*  CREATININE 10.32*  --   --  6.18*  --   --  5.16*  CALCIUM 8.4  --   --  8.2*  --   --  8.3*  PHOS 2.7  < > 4.8*  --  2.5 4.0  --   < > = values in this interval not displayed.  Recent Labs Lab 07/11/13 1940  AST 36  ALT 20  ALKPHOS 85  BILITOT 0.5  PROT 7.9  ALBUMIN 3.8    Recent Labs Lab 07/10/13 0500 07/11/13 0246  WBC 12.8* 12.2*  HGB 9.2* 10.6*  HCT 28.1* 31.6*  MCV 97.9 97.2  PLT 221 PLATELET CLUMPS NOTED ON SMEAR, COUNT APPEARS ADEQUATE   . antiseptic oral rinse  15 mL Mouth Rinse QID  . aspirin EC  81 mg Oral Daily  . atorvastatin  40 mg  Oral q1800  . chlorhexidine  15 mL Mouth Rinse BID  . clopidogrel  75 mg Per Tube Q breakfast  . darbepoetin      . darbepoetin (ARANESP) injection - DIALYSIS  40 mcg Intravenous Q Mon-HD  . famotidine (PEPCID) IV  20 mg Intravenous Daily  . feeding supplement (NEPRO CARB STEADY)  1,000 mL Per Tube Q24H  . feeding supplement (PRO-STAT SUGAR FREE 64)  30 mL Per Tube BID  . ferric gluconate (FERRLECIT/NULECIT) IV  125 mg Intravenous Q M,W,F-HD  . insulin aspart  2-6 Units Subcutaneous 6 times per day  .  lanthanum  1,000 mg Per Tube TID WC  . mupirocin ointment       . sodium chloride 10 mL/hr at 07/09/13 2115  . fentaNYL infusion INTRAVENOUS Stopped (07/11/13 0900)  . fentaNYL infusion INTRAVENOUS 75 mcg/hr (07/12/13 0944)  . heparin 900 Units/hr (07/12/13 0100)  . midazolam (VERSED) infusion Stopped (07/12/13 0944)  . norepinephrine (LEVOPHED) Adult infusion Stopped (07/12/13 1120)   sodium chloride, sodium chloride, sodium chloride, dextrose, feeding supplement (NEPRO CARB STEADY), fentaNYL, fentaNYL, heparin, heparin, lidocaine (PF), lidocaine-prilocaine, midazolam, morphine injection, pentafluoroprop-tetrafluoroeth

## 2013-07-12 NOTE — Consult Note (Signed)
Cardiology Consultation  Welcome Fults    [id number] Jul 17, 1933  Reason for Consult: NSTEMI with Troponin elevation  Requesting Physician: Titus Mould  Primary Cardiologist: new  HPI:The patient is a 80 y.o. year-old with hx of HTN, DM, PAD (s/p left fem-pop) and ESRD on HD starting since around March 2010. Patient presented to [facility] with SOB and abd pain. CXR showed diffuse bilat pulm edema. Pt intubated and transferred to [facility]. Initial Troponins were negative; BNP 54473. He has been undergoing dialysis on 4/6,7 and today. Yesterday, patient self extubated himself, then later developed marked respiratory distress, complained of chest pain  and required re-intubation. Troponin today has increased to 9.94 without acute ST elevation c/w NSTEMI.  Cardiology consult requested.  No history obtainable from patient who is currently sedated on ventilator.    Past Medical History  Diagnosis Date  . Diabetes mellitus   . Hypertension   . Hyperlipidemia   . Glaucoma   . Cataract   . Peripheral vascular disease   . ESRD on dialysis    Past Surgical History  Procedure Laterality Date  . Pr vein bypass graft,aorto-fem-pop  02/20/2010    left fem pop with saphenous vein  . Av fistula placement  10/24/2009    left    FAMHx: Family History  Problem Relation Age of Onset  . Heart disease Father     Heart Disease before age 23  . Diabetes Father   . Hyperlipidemia Father   . Hypertension Father   . Hyperlipidemia Mother   . Hypertension Mother   . Heart disease Mother     Heart Disease before age 64  . Diabetes Brother   . Hyperlipidemia Brother   . Hypertension Brother     SOCHx:  reports that he quit smoking about 37 years ago. He quit smokeless tobacco use about 26 years ago. He reports that he does not drink alcohol or use illicit drugs.  ALLERGIES: No Known Allergies  ROS: chest pain, dyspnea and congestive heart failure In addition a comprehensive review of  systems was negative except for PVD s/p left fem-pop, ESRD, DM, HTN, hyperlipidemia,airspace disease, cataract,glaucoma.  HOME MEDICATIONS: Prescriptions prior to admission  Medication Sig Dispense Refill  . aspirin 81 MG tablet Take 81 mg by mouth daily.      . carvedilol (COREG) 3.125 MG tablet Take 3.125 mg by mouth See admin instructions. Takes 1 tablet daily after dialysis on Mon, Wed, and Fri.  Take 1 tablet twice a day the rest of the week.      . clopidogrel (PLAVIX) 75 MG tablet Take 75 mg by mouth daily.      Marland Kitchen ezetimibe-simvastatin (VYTORIN) 10-80 MG per tablet Take 0.5 tablets by mouth daily.      . isosorbide mononitrate (IMDUR) 30 MG 24 hr tablet Take 30 mg by mouth daily.       . multivitamin (RENA-VIT) TABS tablet Take 1 tablet by mouth daily.      Marland Kitchen NOVOLOG MIX 70/30 (70-30) 100 UNIT/ML injection Inject 10 Units into the skin Twice daily.         HOSPITAL MEDICATIONS: Scheduled: . antiseptic oral rinse  15 mL Mouth Rinse QID  . aspirin EC  81 mg Oral Daily  . atorvastatin  40 mg Oral q1800  . chlorhexidine  15 mL Mouth Rinse BID  . clopidogrel  75 mg Per Tube Q breakfast  . darbepoetin      . darbepoetin (ARANESP) injection - DIALYSIS  40 mcg Intravenous  Q Mon-HD  . famotidine (PEPCID) IV  20 mg Intravenous Daily  . feeding supplement (NEPRO CARB STEADY)  1,000 mL Per Tube Q24H  . feeding supplement (PRO-STAT SUGAR FREE 64)  30 mL Per Tube BID  . ferric gluconate (FERRLECIT/NULECIT) IV  125 mg Intravenous Q M,W,F-HD  . heparin  2,000 Units Dialysis Once in dialysis  . heparin  2,000 Units Dialysis Once in dialysis  . insulin aspart  2-6 Units Subcutaneous 6 times per day  . lanthanum  1,000 mg Per Tube TID WC  . mupirocin ointment        VITALS: Blood pressure 92/44, pulse 77, temperature 99.8 F (37.7 C), temperature source Oral, resp. rate 16, height 5' 4"  (1.626 m), weight 135 lb 2.3 oz (61.3 kg), SpO2 100.00%.  PHYSICAL EXAM: General appearance: sedated;  intubated on vent Neck: no adenopathy, no carotid bruit, supple, symmetrical, trachea midline, thyroid not enlarged, symmetric, no tenderness/mass/nodules  Lungs: no wheezing; decreased BS Heart: regular rate and rhythm and rare ectopic complex Abdomen: soft, non-tender; bowel sounds normal; no masses,  no organomegaly Extremities: extremities normal, atraumatic, no cyanosis or edema Pulses: 2+ and symmetric LUE dialysis access Skin: Skin color, texture, turgor normal. No rashes or lesions Neurologic: responsive to sternal rub   ECG:  NSR with nonspecific ST changes  LABS: Results for orders placed during the hospital encounter of 07/09/13 (from the past 48 hour(s))  MAGNESIUM     Status: None   Collection Time    07/10/13 10:05 AM      Result Value Ref Range   Magnesium 2.5  1.5 - 2.5 mg/dL  PHOSPHORUS     Status: None   Collection Time    07/10/13 10:05 AM      Result Value Ref Range   Phosphorus 3.4  2.3 - 4.6 mg/dL  GLUCOSE, CAPILLARY     Status: None   Collection Time    07/10/13 11:48 AM      Result Value Ref Range   Glucose-Capillary 97  70 - 99 mg/dL  RESPIRATORY VIRUS PANEL     Status: None   Collection Time    07/10/13 12:06 PM      Result Value Ref Range   Source - RVPAN TRACHEAL ASPIRATE     Respiratory Syncytial Virus A NOT DETECTED     Respiratory Syncytial Virus B NOT DETECTED     Influenza A NOT DETECTED     Influenza B NOT DETECTED     Parainfluenza 1 NOT DETECTED     Parainfluenza 2 NOT DETECTED     Parainfluenza 3 NOT DETECTED     Metapneumovirus NOT DETECTED     Rhinovirus NOT DETECTED     Adenovirus NOT DETECTED     Influenza A H1 NOT DETECTED     Influenza A H3 NOT DETECTED     Comment: (NOTE)           Normal Reference Range for each Analyte: NOT DETECTED     Testing performed using the Luminex xTAG Respiratory Viral Panel test     kit.     This test was developed and its performance characteristics determined     by [employer]. It  has not been cleared or approved by the [place]     Food and Drug Administration. This test is used for clinical purposes.     It should not be regarded as investigational or for research. This     laboratory is certified under  the Clinical Laboratory Improvement     Amendments of 1988 (CLIA) as qualified to perform high complexity     clinical laboratory testing.     Performed at [place], [place]     Status: Abnormal   Collection Time    07/10/13  3:38 PM      Result Value Ref Range   Glucose-Capillary 173 (*) 70 - 99 mg/dL  GLUCOSE, CAPILLARY     Status: None   Collection Time    07/10/13  7:13 PM      Result Value Ref Range   Glucose-Capillary 95  70 - 99 mg/dL  GLUCOSE, CAPILLARY     Status: Abnormal   Collection Time    07/11/13 12:00 AM      Result Value Ref Range   Glucose-Capillary 154 (*) 70 - 99 mg/dL   Comment 1 Documented in Chart     Comment 2 Notify RN    MAGNESIUM     Status: None   Collection Time    07/11/13 12:55 AM      Result Value Ref Range   Magnesium 1.9  1.5 - 2.5 mg/dL  PHOSPHORUS     Status: Abnormal   Collection Time    07/11/13 12:55 AM      Result Value Ref Range   Phosphorus 4.8 (*) 2.3 - 4.6 mg/dL  CBC     Status: Abnormal   Collection Time    07/11/13  2:46 AM      Result Value Ref Range   WBC 12.2 (*) 4.0 - 10.5 K/uL   Comment: WHITE COUNT CONFIRMED ON SMEAR   RBC 3.25 (*) 4.22 - 5.81 MIL/uL   Hemoglobin 10.6 (*) 13.0 - 17.0 g/dL   HCT 31.6 (*) 39.0 - 52.0 %   MCV 97.2  78.0 - 100.0 fL   MCH 32.6  26.0 - 34.0 pg   MCHC 33.5  30.0 - 36.0 g/dL   RDW 14.9  11.5 - 15.5 %   Platelets    150 - 400 K/uL   Value: PLATELET CLUMPS NOTED ON SMEAR, COUNT APPEARS ADEQUATE  BASIC METABOLIC PANEL     Status: Abnormal   Collection Time    07/11/13  2:46 AM      Result Value Ref Range   Sodium 135 (*) 137 - 147 mEq/L   Comment: DELTA CHECK NOTED   Potassium 5.3  3.7 - 5.3 mEq/L   Comment: HEMOLYSIS AT THIS LEVEL MAY AFFECT  RESULT   Chloride 89 (*) 96 - 112 mEq/L   CO2 27  19 - 32 mEq/L   Glucose, Bld 138 (*) 70 - 99 mg/dL   BUN 32 (*) 6 - 23 mg/dL   Comment: DELTA CHECK NOTED   Creatinine, Ser 6.18 (*) 0.50 - 1.35 mg/dL   Comment: DELTA CHECK NOTED   Calcium 8.2 (*) 8.4 - 10.5 mg/dL   GFR calc non Af Amer 8 (*) >90 mL/min   GFR calc Af Amer 9 (*) >90 mL/min   Comment: (NOTE)     The eGFR has been calculated using the CKD EPI equation.     This calculation has not been validated in all clinical situations.     eGFR's persistently <90 mL/min signify possible Chronic Kidney     Disease.  GLUCOSE, CAPILLARY     Status: Abnormal   Collection Time    07/11/13  4:11 AM      Result Value Ref Range   Glucose-Capillary  108 (*) 70 - 99 mg/dL   Comment 1 Documented in Chart     Comment 2 Notify RN    GLUCOSE, CAPILLARY     Status: Abnormal   Collection Time    07/11/13  8:14 AM      Result Value Ref Range   Glucose-Capillary 108 (*) 70 - 99 mg/dL  GLUCOSE, CAPILLARY     Status: Abnormal   Collection Time    07/11/13 11:39 AM      Result Value Ref Range   Glucose-Capillary 135 (*) 70 - 99 mg/dL  TROPONIN I     Status: Abnormal   Collection Time    07/11/13  3:37 PM      Result Value Ref Range   Troponin I 0.80 (*) <0.30 ng/mL   Comment:            Due to the release kinetics of cTnI,     a negative result within the first hours     of the onset of symptoms does not rule out     myocardial infarction with certainty.     If myocardial infarction is still suspected,     repeat the test at appropriate intervals.     CRITICAL RESULT CALLED TO, READ BACK BY AND VERIFIED WITH:     A MINOR,RN 07/11/13 1732 SHIPMAN M  POCT I-STAT 3, BLOOD GAS (G3+)     Status: Abnormal   Collection Time    07/11/13  5:27 PM      Result Value Ref Range   pH, Arterial 7.348 (*) 7.350 - 7.450   pCO2 arterial 49.7 (*) 35.0 - 45.0 mmHg   pO2, Arterial 83.0  80.0 - 100.0 mmHg   Bicarbonate 27.3 (*) 20.0 - 24.0 mEq/L   TCO2  29  0 - 100 mmol/L   O2 Saturation 95.0     Acid-Base Excess 1.0  0.0 - 2.0 mmol/L   Patient temperature 98.6 F     Collection site BRACHIAL ARTERY     Drawn by Operator     Sample type ARTERIAL    GLUCOSE, CAPILLARY     Status: Abnormal   Collection Time    07/11/13  7:24 PM      Result Value Ref Range   Glucose-Capillary 119 (*) 70 - 99 mg/dL  MAGNESIUM     Status: None   Collection Time    07/11/13  7:40 PM      Result Value Ref Range   Magnesium 2.1  1.5 - 2.5 mg/dL  PHOSPHORUS     Status: None   Collection Time    07/11/13  7:40 PM      Result Value Ref Range   Phosphorus 2.5  2.3 - 4.6 mg/dL  HEPATIC FUNCTION PANEL     Status: None   Collection Time    07/11/13  7:40 PM      Result Value Ref Range   Total Protein 7.9  6.0 - 8.3 g/dL   Albumin 3.8  3.5 - 5.2 g/dL   AST 36  0 - 37 U/L   ALT 20  0 - 53 U/L   Alkaline Phosphatase 85  39 - 117 U/L   Total Bilirubin 0.5  0.3 - 1.2 mg/dL   Bilirubin, Direct <0.2  0.0 - 0.3 mg/dL   Indirect Bilirubin NOT CALCULATED  0.3 - 0.9 mg/dL  TROPONIN I     Status: Abnormal   Collection Time    07/11/13 10:00 PM  Result Value Ref Range   Troponin I 5.35 (*) <0.30 ng/mL   Comment:            Due to the release kinetics of cTnI,     a negative result within the first hours     of the onset of symptoms does not rule out     myocardial infarction with certainty.     If myocardial infarction is still suspected,     repeat the test at appropriate intervals.     CRITICAL RESULT CALLED TO, READ BACK BY AND VERIFIED WITH:     Rip Harbour RN [phone] GREEN R  MAGNESIUM     Status: None   Collection Time    07/11/13 10:00 PM      Result Value Ref Range   Magnesium 2.1  1.5 - 2.5 mg/dL  PHOSPHORUS     Status: None   Collection Time    07/11/13 10:00 PM      Result Value Ref Range   Phosphorus 4.0  2.3 - 4.6 mg/dL  GLUCOSE, CAPILLARY     Status: Abnormal   Collection Time    07/11/13 11:25 PM      Result Value Ref Range    Glucose-Capillary 129 (*) 70 - 99 mg/dL   Comment 1 Notify RN    APTT     Status: Abnormal   Collection Time    07/12/13 12:30 AM      Result Value Ref Range   aPTT 46 (*) 24 - 37 seconds   Comment:            IF BASELINE aPTT IS ELEVATED,     SUGGEST PATIENT RISK ASSESSMENT     BE USED TO DETERMINE APPROPRIATE     ANTICOAGULANT THERAPY.  PROTIME-INR     Status: None   Collection Time    07/12/13 12:30 AM      Result Value Ref Range   Prothrombin Time 14.8  11.6 - 15.2 seconds   INR 1.19  0.00 - 1.49  HEPARIN LEVEL (UNFRACTIONATED)     Status: Abnormal   Collection Time    07/12/13 12:30 AM      Result Value Ref Range   Heparin Unfractionated <0.10 (*) 0.30 - 0.70 IU/mL   Comment:            IF HEPARIN RESULTS ARE BELOW     EXPECTED VALUES, AND PATIENT     DOSAGE HAS BEEN CONFIRMED,     SUGGEST FOLLOW UP TESTING     OF ANTITHROMBIN III LEVELS.  TROPONIN I     Status: Abnormal   Collection Time    07/12/13  2:55 AM      Result Value Ref Range   Troponin I 9.94 (*) <0.30 ng/mL   Comment:            Due to the release kinetics of cTnI,     a negative result within the first hours     of the onset of symptoms does not rule out     myocardial infarction with certainty.     If myocardial infarction is still suspected,     repeat the test at appropriate intervals.     CRITICAL VALUE NOTED.  VALUE IS CONSISTENT WITH PREVIOUSLY REPORTED AND CALLED VALUE.  BASIC METABOLIC PANEL     Status: Abnormal   Collection Time    07/12/13  2:55 AM      Result Value Ref Range  Sodium 141  137 - 147 mEq/L   Potassium 4.1  3.7 - 5.3 mEq/L   Comment: DELTA CHECK NOTED   Chloride 97  96 - 112 mEq/L   CO2 24  19 - 32 mEq/L   Glucose, Bld 122 (*) 70 - 99 mg/dL   BUN 25 (*) 6 - 23 mg/dL   Creatinine, Ser 5.16 (*) 0.50 - 1.35 mg/dL   Calcium 8.3 (*) 8.4 - 10.5 mg/dL   GFR calc non Af Amer 10 (*) >90 mL/min   GFR calc Af Amer 11 (*) >90 mL/min   Comment: (NOTE)     The eGFR has been  calculated using the CKD EPI equation.     This calculation has not been validated in all clinical situations.     eGFR's persistently <90 mL/min signify possible Chronic Kidney     Disease.  GLUCOSE, CAPILLARY     Status: Abnormal   Collection Time    07/12/13  3:44 AM      Result Value Ref Range   Glucose-Capillary 103 (*) 70 - 99 mg/dL   Comment 1 Notify RN    GLUCOSE, CAPILLARY     Status: Abnormal   Collection Time    07/12/13  8:04 AM      Result Value Ref Range   Glucose-Capillary 106 (*) 70 - 99 mg/dL    IMAGING: Dg Chest Port 1 View  07/11/2013   CLINICAL DATA:  Shortness of breath.  EXAM: PORTABLE CHEST - 1 VIEW  COMPARISON:  Portable film earlier in the day.  FINDINGS: ET tube 3.9 cm above carina. Central venous catheter tip from right IJ approach lies at the SVC RA junction. The heart is enlarged. There is widespread bilateral pulmonary opacities consistent with pulmonary edema. Bilateral effusions. Worsening aeration compared with priors. No visible pneumothorax.  IMPRESSION: Worsening aeration.  Probable developing pulmonary edema.   Electronically Signed   By: Rolla Flatten M.D.   On: 07/11/2013 17:07   Dg Chest Port 1 View  07/11/2013   CLINICAL DATA:  Chest pain.  Endotracheal tube position  EXAM: PORTABLE CHEST - 1 VIEW  COMPARISON:  07/11/2013  FINDINGS: Endotracheal tube in good position.  NG tube has been removed.  Improvement in edema and bilateral effusions since earlier today. There remains mild edema and small effusions. Improvement in bibasilar atelectasis.  IMPRESSION: Endotracheal tube in good position. Improvement in pulmonary edema and bilateral effusions.   Electronically Signed   By: Franchot Gallo M.D.   On: 07/11/2013 15:50   Dg Chest Port 1 View  07/11/2013   CLINICAL DATA:  Pneumonia  EXAM: PORTABLE CHEST - 1 VIEW  COMPARISON:  07/10/2013  FINDINGS: The endotracheal tube is noted in satisfactory position. The nasogastric catheter is seen within the stomach.  The cardiac shadow is stable. Bilateral pleural effusions are again identified. Vascular congestion is again seen.  IMPRESSION: The overall appearance has worsened in the interval with increasing bilateral pleural effusions.   Electronically Signed   By: Inez Catalina M.D.   On: 07/11/2013 08:10    IMPRESSION:  1. NSTEM with trop increasing from nl to 5.35 yesterday and 9.94 without acute ST elevation 2. Pulmonary edema 3. Respiratory failure requiring re-intubation yesterday 4. ESRD currently undergoing dialysis for 3 rd consecutive day.\ 5. PVD. S/p left fem-pop 6. DM   Discussed with Dr. Titus Mould. With chest pain yesterday and recurrent respiratory distress may be best to do R and L heart cath today while intubated  and after dialysis completed. Will need to discuss with family since consent not able to be obtained from patient.  Attending:  Troy Sine, MD, [facility] 07/12/2013 9:17 AM

## 2013-07-13 ENCOUNTER — Inpatient Hospital Stay (HOSPITAL_COMMUNITY): Payer: Medicare Other

## 2013-07-13 ENCOUNTER — Encounter (HOSPITAL_COMMUNITY)
Admission: AD | Disposition: A | Discharge status: Rehabilitation Facility | Payer: Medicare Other | Source: Other Acute Inpatient Hospital | Attending: Internal Medicine

## 2013-07-13 DIAGNOSIS — I10 Essential (primary) hypertension: Secondary | ICD-10-CM

## 2013-07-13 DIAGNOSIS — I2510 Atherosclerotic heart disease of native coronary artery without angina pectoris: Secondary | ICD-10-CM

## 2013-07-13 DIAGNOSIS — E1159 Type 2 diabetes mellitus with other circulatory complications: Secondary | ICD-10-CM

## 2013-07-13 HISTORY — PX: PERCUTANEOUS CORONARY STENT INTERVENTION (PCI-S): SHX5485

## 2013-07-13 LAB — GLUCOSE, CAPILLARY
GLUCOSE-CAPILLARY: 129 mg/dL — AB (ref 70–99)
GLUCOSE-CAPILLARY: 130 mg/dL — AB (ref 70–99)
GLUCOSE-CAPILLARY: 138 mg/dL — AB (ref 70–99)
Glucose-Capillary: 170 mg/dL — ABNORMAL HIGH (ref 70–99)
Glucose-Capillary: 145 mg/dL — ABNORMAL HIGH (ref 70–99)
Glucose-Capillary: 145 mg/dL — ABNORMAL HIGH (ref 70–99)

## 2013-07-13 LAB — POCT ACTIVATED CLOTTING TIME
Activated Clotting Time: 509 seconds
Activated Clotting Time: 199 seconds
Activated Clotting Time: 199 seconds
Activated Clotting Time: 171 seconds

## 2013-07-13 LAB — BASIC METABOLIC PANEL
BUN: 36 mg/dL — ABNORMAL HIGH (ref 6–23)
CALCIUM: 8.1 mg/dL — AB (ref 8.4–10.5)
CO2: 19 meq/L (ref 19–32)
Chloride: 96 mEq/L (ref 96–112)
Creatinine, Ser: 5.63 mg/dL — ABNORMAL HIGH (ref 0.50–1.35)
GFR calc Af Amer: 10 mL/min — ABNORMAL LOW (ref >90)
GFR calc non Af Amer: 9 mL/min — ABNORMAL LOW (ref >90)
GLUCOSE: 177 mg/dL — AB (ref 70–99)
Potassium: 3.3 mEq/L — ABNORMAL LOW (ref 3.7–5.3)
Sodium: 142 mEq/L (ref 137–147)

## 2013-07-13 LAB — PRO B NATRIURETIC PEPTIDE: Pro B Natriuretic peptide (BNP): 47075 pg/mL — ABNORMAL HIGH (ref 0–450)

## 2013-07-13 LAB — TROPONIN I: TROPONIN I: 5.4 ng/mL — AB (ref <0.30)

## 2013-07-13 LAB — PROTIME-INR
INR: 1.12 (ref 0.00–1.49)
Prothrombin Time: 14.2 seconds (ref 11.6–15.2)

## 2013-07-13 LAB — CBC
HEMATOCRIT: 32.5 % — AB (ref 39.0–52.0)
HEMOGLOBIN: 10.8 g/dL — AB (ref 13.0–17.0)
MCH: 32.1 pg (ref 26.0–34.0)
MCHC: 33.2 g/dL (ref 30.0–36.0)
MCV: 96.7 fL (ref 78.0–100.0)
Platelets: 209 10*3/uL (ref 150–400)
RBC: 3.36 MIL/uL — AB (ref 4.22–5.81)
RDW: 14.5 % (ref 11.5–15.5)
WBC: 11 10*3/uL — ABNORMAL HIGH (ref 4.0–10.5)

## 2013-07-13 SURGERY — PERCUTANEOUS CORONARY STENT INTERVENTION (PCI-S)
Anesthesia: LOCAL

## 2013-07-13 MED ORDER — SODIUM CHLORIDE 0.9 % IJ SOLN: 3.0000 mL | INTRAMUSCULAR | Status: DC | PRN

## 2013-07-13 MED ORDER — CLOPIDOGREL BISULFATE 75 MG PO TABS
150.0000 mg | ORAL_TABLET | Freq: Once | ORAL | Status: AC
  Administered 2013-07-13: 150 mg via ORAL
  Filled 2013-07-13: qty 2

## 2013-07-13 MED ORDER — LIDOCAINE HCL (PF) 1 % IJ SOLN: 5.0000 mL | INTRAMUSCULAR | Status: DC | PRN

## 2013-07-13 MED ORDER — HEPARIN (PORCINE) IN NACL 2-0.9 UNIT/ML-% IJ SOLN
INTRAMUSCULAR | Status: AC
  Filled 2013-07-13: qty 1000

## 2013-07-13 MED ORDER — BIVALIRUDIN 250 MG IV SOLR
INTRAVENOUS | Status: AC
  Filled 2013-07-13: qty 250

## 2013-07-13 MED ORDER — ASPIRIN 81 MG PO CHEW
81.0000 mg | CHEWABLE_TABLET | Freq: Every day | ORAL | Status: DC
  Administered 2013-07-13 – 2013-07-20 (×7): 81 mg
  Filled 2013-07-13 (×6): qty 1

## 2013-07-13 MED ORDER — NITROGLYCERIN 0.2 MG/ML ON CALL CATH LAB
INTRAVENOUS | Status: AC
  Filled 2013-07-13: qty 1

## 2013-07-13 MED ORDER — SODIUM CHLORIDE 0.9 % IV SOLN: 100.0000 mL | INTRAVENOUS | Status: DC | PRN

## 2013-07-13 MED ORDER — SODIUM CHLORIDE 0.9 % IJ SOLN: 3.0000 mL | Freq: Two times a day (BID) | INTRAMUSCULAR | Status: DC

## 2013-07-13 MED ORDER — HEPARIN SODIUM (PORCINE) 1000 UNIT/ML DIALYSIS
2000.0000 [IU] | Freq: Once | INTRAMUSCULAR | Status: AC
Start: 2013-07-14 — End: 2013-07-14
  Administered 2013-07-14: 2000 [IU] via INTRAVENOUS_CENTRAL

## 2013-07-13 MED ORDER — PENTAFLUOROPROP-TETRAFLUOROETH EX AERO: 1.0000 "application " | INHALATION_SPRAY | CUTANEOUS | Status: DC | PRN

## 2013-07-13 MED ORDER — LIDOCAINE HCL (PF) 1 % IJ SOLN
INTRAMUSCULAR | Status: AC
Start: 2013-07-13 — End: 2013-07-13
  Filled 2013-07-13: qty 30

## 2013-07-13 MED ORDER — HEPARIN SODIUM (PORCINE) 5000 UNIT/ML IJ SOLN
5000.0000 [IU] | Freq: Three times a day (TID) | INTRAMUSCULAR | Status: DC
  Administered 2013-07-13 – 2013-07-20 (×20): 5000 [IU] via SUBCUTANEOUS
  Filled 2013-07-13 (×24): qty 1

## 2013-07-13 MED ORDER — NEPRO/CARBSTEADY PO LIQD
237.0000 mL | ORAL | Status: DC | PRN
  Filled 2013-07-13: qty 237

## 2013-07-13 MED ORDER — HYDRALAZINE HCL 20 MG/ML IJ SOLN
INTRAMUSCULAR | Status: AC
  Filled 2013-07-13: qty 1

## 2013-07-13 MED ORDER — POTASSIUM CHLORIDE 10 MEQ/50ML IV SOLN
10.0000 meq | Freq: Once | INTRAVENOUS | Status: AC
  Administered 2013-07-13: 10 meq via INTRAVENOUS
  Filled 2013-07-13: qty 50

## 2013-07-13 MED ORDER — SODIUM CHLORIDE 0.9 % IV SOLN
INTRAVENOUS | Status: DC
  Administered 2013-07-16: 1000 mL via INTRAVENOUS

## 2013-07-13 MED ORDER — ALTEPLASE 2 MG IJ SOLR
2.0000 mg | Freq: Once | INTRAMUSCULAR | Status: AC | PRN
  Filled 2013-07-13: qty 2

## 2013-07-13 MED ORDER — SODIUM CHLORIDE 0.9 % IV SOLN: 250.0000 mL | INTRAVENOUS | Status: DC | PRN

## 2013-07-13 MED ORDER — LIDOCAINE-PRILOCAINE 2.5-2.5 % EX CREA
1.0000 "application " | TOPICAL_CREAM | CUTANEOUS | Status: DC | PRN
  Filled 2013-07-13: qty 5

## 2013-07-13 MED ORDER — FAMOTIDINE 20 MG PO TABS
20.0000 mg | ORAL_TABLET | Freq: Every day | ORAL | Status: DC
  Administered 2013-07-14 – 2013-07-20 (×6): 20 mg
  Filled 2013-07-13 (×8): qty 1

## 2013-07-13 MED ORDER — HEPARIN SODIUM (PORCINE) 1000 UNIT/ML DIALYSIS
1000.0000 [IU] | INTRAMUSCULAR | Status: DC | PRN
  Filled 2013-07-13: qty 1

## 2013-07-13 NOTE — Interval H&P Note (Signed)
History and Physical Interval Note:  07/13/2013 1:18 PM  [name]  has presented today for surgery, with the diagnosis of NSTEMI & KNOWN MULTIVESSEL CAD.   The various methods of treatment have been discussed with the patient and family. After consideration of risks, benefits and other options for treatment, the patient has consented to  Procedure(s): PERCUTANEOUS CORONARY STENT INTERVENTION (PCI-S) (N/A) as a surgical intervention .    The patient's history has been reviewed, patient examined, no change in status, relatively stable for surgery.  I have reviewed the patient's chart and labs.  Questions were answered to the patient's family's satisfaction.    I have reviewed the images with Dr. [place].  I agree that the LAD & RCA lesions are both significant & are amenable to PCI.  The complex Circumflex lesion is significant, but not as straight forward from a PCI standpoint.  After discussing options to improve overall coronary flow & allow for PCCM to move for extubation, we decided that performing 2 Vessel PCI of the LAD & RCA is a reasonable approach, leaving the smaller caliber circumflex distribution for medical therapy.  [name]   Cath Lab Visit (complete for each Cath Lab visit)  Clinical Evaluation Leading to the Procedure:   ACS: yes  Non-ACS:    Anginal Classification: CCS IV  Anti-ischemic medical therapy: Maximal Therapy (2 or more classes of medications) prior to admission  Non-Invasive Test Results: No non-invasive testing performed - Diagnostic Cath with multiple vessel CAD.  Prior CABG: No previous CABG  Antonio Lexavid W Harding, MD

## 2013-07-13 NOTE — Progress Notes (Signed)
[facility] Progress Note   Subjective: On vent, to have heart cath today  Filed Vitals:   07/13/13 0742 07/13/13 0800 07/13/13 0900 07/13/13 0905  BP: 111/33 127/35 161/45   Pulse: 67 62 69   Temp:    99.5 F (37.5 C)  TempSrc:    Oral  Resp: 16 16 16    Height:      Weight:      SpO2: 100% 100% 100%    Exam: On vent +JVD  Chest bilat occ rhonchi  RRR 2/6 SEM no RG  Abd soft, NTND  LE trace edema bilat  LUA AVF patent +bruit  Neuro sedated on vent  ECHO - no old echo on chart  Dialysis: MWF [place]  3h 67kg (new dry wt 61-62 kg) 2/2.5 [place] P4 LUA AVF Heparin 2000  Hectorol none Epo 6200 Venofer 100 /wk on Wed  Assessment:  1 Acute resp failure / pulm edema- much better, down 6kg below prior dry wt 2 NSTEMI / severe 3VCAD- not surgical candidate 3 ESRD 4 HTN/volume- on no BP meds currently 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  6 Anemia cont esa w darbe 40/wk  7 PAD hx L fempop  7 DM on insulin  Plan- HD tomorrow, set new dry wt around 61-62 kg    Vinson Moselle MD  pager [phone]    cell [phone]  07/13/2013, 10:17 AM     Recent Labs Lab 07/11/13 0055 07/11/13 0246 07/11/13 1940 07/11/13 2200 07/12/13 0255 07/13/13 0500  NA  --  135*  --   --  141 142  K  --  5.3  --   --  4.1 3.3*  CL  --  89*  --   --  97 96  CO2  --  27  --   --  24 19  GLUCOSE  --  138*  --   --  122* 177*  BUN  --  32*  --   --  25* 36*  CREATININE  --  6.18*  --   --  5.16* 5.63*  CALCIUM  --  8.2*  --   --  8.3* 8.1*  PHOS 4.8*  --  2.5 4.0  --   --     Recent Labs Lab 07/11/13 1940  AST 36  ALT 20  ALKPHOS 85  BILITOT 0.5  PROT 7.9  ALBUMIN 3.8    Recent Labs Lab 07/10/13 0500 07/11/13 0246 07/13/13 0500  WBC 12.8* 12.2* 11.0*  HGB 9.2* 10.6* 10.8*  HCT 28.1* 31.6* 32.5*  MCV 97.9 97.2 96.7  PLT 221 PLATELET CLUMPS NOTED ON SMEAR, COUNT APPEARS ADEQUATE 209   . antiseptic oral rinse  15 mL Mouth Rinse QID  . aspirin  81 mg Per Tube Daily  .  atorvastatin  40 mg Oral q1800  . chlorhexidine  15 mL Mouth Rinse BID  . darbepoetin (ARANESP) injection - DIALYSIS  40 mcg Intravenous Q Mon-HD  . famotidine (PEPCID) IV  20 mg Intravenous Daily  . feeding supplement (NEPRO CARB STEADY)  1,000 mL Per Tube Q24H  . feeding supplement (PRO-STAT SUGAR FREE 64)  30 mL Per Tube BID  . ferric gluconate (FERRLECIT/NULECIT) IV  125 mg Intravenous Q M,W,F-HD  . insulin aspart  2-6 Units Subcutaneous 6 times per day  . lanthanum  1,000 mg Per Tube TID WC  . potassium chloride  10 mEq Intravenous Once  . sodium chloride  3 mL Intravenous Q12H   .  sodium chloride 10 mL/hr at 07/09/13 2115  . fentaNYL infusion INTRAVENOUS 75 mcg/hr (07/12/13 0800)  . fentaNYL infusion INTRAVENOUS 10 mcg/hr (07/13/13 0000)  . heparin 850 Units/hr (07/13/13 0202)  . midazolam (VERSED) infusion 3 mg/hr (07/13/13 0533)  . norepinephrine (LEVOPHED) Adult infusion 2 mcg/min (07/13/13 0855)   sodium chloride, sodium chloride, dextrose, feeding supplement (NEPRO CARB STEADY), fentaNYL, fentaNYL, heparin, heparin, lidocaine (PF), lidocaine-prilocaine, midazolam, morphine injection, pentafluoroprop-tetrafluoroeth, sodium chloride

## 2013-07-13 NOTE — Progress Notes (Signed)
PULMONARY / CRITICAL CARE MEDICINE   Name: [name] MRN: [id number] DOB: [birth year]    ADMISSION DATE:  07/09/2013 CONSULTATION DATE:  4/5 PRIMARY SERVICE: PCCM  CHIEF COMPLAINT:  SOB and abdominal pain  BRIEF PATIENT DESCRIPTION:   Patient is a 83 man with acute onset SOB with history of ESRD on HD (MWF), PVD (s/p left fempop) & DM on insulin. Admitted for abdominal pain and SOB. Per [facility] chart review, patient has been without fever and cough.   He had a similar episode of SOB in December at [facility] [place], told it was not HF, and thought there was something wrong with his lungs. The  patient is constantly sleepy since Dec.  He has had similar SOB prior to Dec, thought to be due to heart failure. To note, patient lives alone. He has 5 children, but only speaks with 2, a daughter in [place], [phone] - primary contact) and son in [place].     SIGNIFICANT EVENTS / STUDIES:   07/09/13- CXR in [facility]: B/l pulm edema (CD with image in chart) 4/5-CXR: underlying congestive heart failure, airspace consolidation in both bases.  4/6-CXR: worsening of airspace disease right lung base.  4/6- Did HD 4/7-CXR: No significant change of airspace disease  4/7-had chest pain and leleved trop-->started heparin gtt 4/7-reintubated, chest pain, pos trop, NSTEMI 4/8-R and L cardiac cath-->three vessel disease. Cardiovascular surgeon consulted-->no CABG due to multiple comorbidities.    LINES / TUBES: ETT 07/09/13 >>>4/7 AM(self extubated) Re-ETT 4/7>> 4/7 Rt IJ>>>   CULTURES: 07/09/13 Blood Cx x2 >>> 07/09/13 Trach Aspiriate >>> negative 07/09/13 Resp Virus PCR Panel >> negative  ANTIBIOTICS: Vanc 4/5 >>>4/6 Zosyn 4/5 >>>4/6  SUBJECTIVE:  The patient is sedated and intubated. Tm 100.8   VITAL SIGNS: Temp:  [98.8 F (37.1 C)-100.8 F (38.2 C)] 98.8 F (37.1 C) (04/09 0447) Pulse Rate:  [65-150] 74 (04/09 0400) Resp:  [16-24] 16 (04/09 0400) BP: (76-152)/(24-56)  85/30 mmHg (04/09 0400) SpO2:  [72 %-100 %] 100 % (04/09 0400) FiO2 (%):  [40 %] 40 % (04/09 0347) Weight:  [135 lb 2.3 oz (61.3 kg)] 135 lb 2.3 oz (61.3 kg) (04/08 0700) HEMODYNAMICS:   VENTILATOR SETTINGS: Vent Mode:  [-] PRVC FiO2 (%):  [40 %] 40 % Set Rate:  [15 bmp-16 bmp] 16 bmp Vt Set:  [500 mL] 500 mL PEEP:  [5 cmH20] 5 cmH20 Plateau Pressure:  [18 cmH20-20 cmH20] 18 cmH20 INTAKE / OUTPUT: Intake/Output     04/08 0701 - 04/09 0700   I.V. (mL/kg) 621.8 (10.1)   NG/GT 460   IV Piggyback 160   Total Intake(mL/kg) 1241.8 (20.3)   Other 3000   Total Output 3000   Net -1758.2         PHYSICAL EXAMINATION: General: intubated and sedated HEENT: PERRL (sluggish), EOMI, no scleral icterus, dentures in place Cardiac: RRR, no rubs, murmurs or gallops Pulm: CTAB Abd: soft, nontender, nondistended, BS normoactive Ext: warm and well perfused, no pedal edema, LUE dialysis access with +thrill Neuro: responsive to sternal rub.  LABS:  CBC  Recent Labs Lab 07/10/13 0500 07/11/13 0246  WBC 12.8* 12.2*  HGB 9.2* 10.6*  HCT 28.1* 31.6*  PLT 221 PLATELET CLUMPS NOTED ON SMEAR, COUNT APPEARS ADEQUATE   Coag's  Recent Labs Lab 07/12/13 0030  APTT 46*  INR 1.19   BMET  Recent Labs Lab 07/10/13 0525 07/11/13 0246 07/12/13 0255  NA 143 135* 141  K 5.3 5.3 4.1  CL 99 89* 97  CO2 24 27 24   BUN 63* 32* 25*  CREATININE 10.32* 6.18* 5.16*  GLUCOSE 44* 138* 122*   Electrolytes  Recent Labs Lab 07/10/13 0525  07/11/13 0055 07/11/13 0246 07/11/13 1940 07/11/13 2200 07/12/13 0255  CALCIUM 8.4  --   --  8.2*  --   --  8.3*  MG 2.4  < > 1.9  --  2.1 2.1  --   PHOS 2.7  < > 4.8*  --  2.5 4.0  --   < > = values in this interval not displayed. Sepsis Markers  Recent Labs Lab 07/09/13 1900  LATICACIDVEN 1.8  PROCALCITON 4.39   ABG  Recent Labs Lab 07/09/13 1611 07/11/13 1727 07/12/13 1359  PHART 7.424 7.348* 7.521*  PCO2ART 41.1 49.7* 39.2  PO2ART  68.0* 83.0 444.0*   Liver Enzymes  Recent Labs Lab 07/11/13 1940  AST 36  ALT 20  ALKPHOS 85  BILITOT 0.5  ALBUMIN 3.8   Cardiac Enzymes  Recent Labs Lab 07/10/13 0525 07/10/13 0910 07/11/13 1537 07/11/13 2200 07/12/13 0255  TROPONINI <0.30  --  0.80* 5.35* 9.94*  PROBNP  --  54473.0*  --   --   --    Glucose  Recent Labs Lab 07/12/13 0804 07/12/13 1124 07/12/13 1525 07/12/13 1904 07/12/13 2352 07/13/13 0447  GLUCAP 106* 193* 143* 147* 138* 170*    Imaging Dg Chest Port 1 View  07/12/2013   CLINICAL DATA:  Edema.  Renal failure.  EXAM: PORTABLE CHEST - 1 VIEW  COMPARISON:  07/11/2013  FINDINGS: Endotracheal tube remains in place with tip approximately 3 cm above the carina. Right jugular central venous catheter remains in place with tip overlying the lower SVC. Enteric tube courses into the left upper abdomen, incompletely imaged. The cardiac silhouette remains mildly enlarged. Thoracic aortic calcification is noted. Diffuse bilateral pulmonary opacities appear slightly improved since the prior study. Small bilateral pleural effusions also may have decreased slightly in size. No pneumothorax is identified.  IMPRESSION: Mild interval improvement in pulmonary edema and small bilateral pleural effusions.   Electronically Signed   By: Sebastian Ache   On: 07/12/2013 11:18   Dg Chest Port 1 View  07/11/2013   CLINICAL DATA:  Shortness of breath.  EXAM: PORTABLE CHEST - 1 VIEW  COMPARISON:  Portable film earlier in the day.  FINDINGS: ET tube 3.9 cm above carina. Central venous catheter tip from right IJ approach lies at the SVC RA junction. The heart is enlarged. There is widespread bilateral pulmonary opacities consistent with pulmonary edema. Bilateral effusions. Worsening aeration compared with priors. No visible pneumothorax.  IMPRESSION: Worsening aeration.  Probable developing pulmonary edema.   Electronically Signed   By: Davonna Belling M.D.   On: 07/11/2013 17:07   Dg Chest  Port 1 View  07/11/2013   CLINICAL DATA:  Chest pain.  Endotracheal tube position  EXAM: PORTABLE CHEST - 1 VIEW  COMPARISON:  07/11/2013  FINDINGS: Endotracheal tube in good position.  NG tube has been removed.  Improvement in edema and bilateral effusions since earlier today. There remains mild edema and small effusions. Improvement in bibasilar atelectasis.  IMPRESSION: Endotracheal tube in good position. Improvement in pulmonary edema and bilateral effusions.   Electronically Signed   By: Marlan Palau M.D.   On: 07/11/2013 15:50    ASSESSMENT / PLAN:  PULMONARY A: Acute Hypoxemic Respiratory failure, ischemia playing a part, and overload -Procalcitonin 4.39 -Improving  P:   -keep same MV, ph reviewed -hold  weaning with concerns active ischemia -cardiac cath on 4/8-->three vessel disease, not candidate for CABG-->May need stent placement LAD? In order to wean successfully -SBTattempt, cpap ps 5,  -will d/w renal goals neg  Balance in future, was euvolemic on Rt heart cath overall -pcxr for volume status in am   CARDIOVASCULAR A: h/o HTN, PVD - NSTEM. R and L cath-->severe 3 vessel obstructive CAD. Per CTS, no CABG-->stent placement. - Normal LV function and normal right heart pressures and LV filling pressures  P:   -Cont ASA, Coreg per tube -Hold Plavix -Lipitor per tube -2D echo pending -hep drip on going -Levophed gtt  For MAP>60, lower MAP goals -card following, will discuss consideration PCI to help weaning vent  RENAL A:  ESRD on HD P:   -did HD yesterday. Renal following, see pulm -cont Fosrenal per tube -kvo  GASTROINTESTINAL A: NO ABDO PAIN noted, LFT normal P:    -Famotidine - GI ppx -restart TF, will need NPO for cath if planned  HEMATOLOGIC A:  DVT prev, mi P:  -DVT ppx: SCD Hep drip, stop date>?  INFECTIOUS A:  No evidence infection.  P:    -off abx, observe  ENDOCRINE A:  DM   P:   -ICU Hyperglycemia protocol  NEUROLOGIC A:  Acute  Encephalopathy - uremia vs benzo (?lorazepam at [place]), not hypercarbic P:   -HD  -fent -WUA planned with SBT  Lorretta HarpXilin Niu, MD PGY3, Internal Medicine Teaching Service Pager: [phone]   Global: consider PCI for weaning, hep stop date?, restart TF  I have personally obtained a history, examined the patient, evaluated laboratory and imaging results, formulated the assessment and plan and placed orders. CRITICAL CARE: The patient is critically ill with multiple organ systems failure and requires high complexity decision making for assessment and support, frequent evaluation and titration of therapies, application of advanced monitoring technologies and extensive interpretation of multiple databases. Critical Care Time devoted to patient care services described in this note is 30 minutes.   Mcarthur Rossettianiel J. Tyson AliasFeinstein, MD, FACP Pgr: [phone] [facility]  Pulmonary and Critical Care Medicine [facility] Pager: [phone]  07/13/2013, 6:21 AM

## 2013-07-13 NOTE — Progress Notes (Signed)
ACT 171, called and spoke with charge nurse Archie Pattenonya on 2H. Asked if someone could come and pull the sheath at their earliest convienience. She agreed and took the patient's information.

## 2013-07-13 NOTE — Progress Notes (Signed)
NUTRITION FOLLOW UP  Intervention:    When able to resume TF after procedure today, continue 35M PEPuP Protocol with Nepro at goal rate of 30 ml/h (720 ml per day) with Prostat 30 ml BID to provide 1496 kcals, 88 gm protein, 523 ml free water daily.  Nutrition Dx:   Inadequate oral intake related to inability to eat as evidenced by NPO status, ongoing.  Goal:   Intake to meet >90% of estimated nutrition needs. Unmet.  Monitor:   TF tolerance/adequacy, weight trend, labs, vent status.  Assessment:   Pt admitted with encephalopathy. Pt with ESRD on HD MWF. Pt with underlying heart failure.   Weight trending down with negative fluid status. Patient self-extubated on 4/7, but required re-intubation. TF of Nepro at 30 ml/h with Prostat 30 ml BID currently on hold for procedure today. Plans for cardiac cath with stent placement today. RN reports that she will resume TF after cath today.  Patient is currently intubated on ventilator support MV: 8 L/min Temp (24hrs), Avg:99.5 F (37.5 C), Min:98.8 F (37.1 C), Max:100.8 F (38.2 C)   Height: Ht Readings from Last 1 Encounters:  07/11/13 5\' 4"  (1.626 m)    Weight Status:   Wt Readings from Last 1 Encounters:  07/12/13 135 lb 2.3 oz (61.3 kg)  07/10/13  147 lb 4.3 oz (66.8 kg)  Re-estimated needs:  Kcal: 1620 Protein: 85-100 gm Fluid: 1.2 L  Skin: no wounds  Diet Order: NPO   Intake/Output Summary (Last 24 hours) at 07/13/13 1040 Last data filed at 07/13/13 0955  Gross per 24 hour  Intake 1090.05 ml  Output   3000 ml  Net -1909.95 ml    Last BM: 4/7   Labs:   Recent Labs Lab 07/11/13 0055 07/11/13 0246 07/11/13 1940 07/11/13 2200 07/12/13 0255 07/13/13 0500  NA  --  135*  --   --  141 142  K  --  5.3  --   --  4.1 3.3*  CL  --  89*  --   --  97 96  CO2  --  27  --   --  24 19  BUN  --  32*  --   --  25* 36*  CREATININE  --  6.18*  --   --  5.16* 5.63*  CALCIUM  --  8.2*  --   --  8.3* 8.1*  MG 1.9  --   2.1 2.1  --   --   PHOS 4.8*  --  2.5 4.0  --   --   GLUCOSE  --  138*  --   --  122* 177*    CBG (last 3)   Recent Labs  07/12/13 2352 07/13/13 0447 07/13/13 0747  GLUCAP 138* 170* 145*    Scheduled Meds: . antiseptic oral rinse  15 mL Mouth Rinse QID  . aspirin  81 mg Per Tube Daily  . atorvastatin  40 mg Oral q1800  . chlorhexidine  15 mL Mouth Rinse BID  . clopidogrel  150 mg Oral Once  . darbepoetin (ARANESP) injection - DIALYSIS  40 mcg Intravenous Q Mon-HD  . famotidine (PEPCID) IV  20 mg Intravenous Daily  . feeding supplement (NEPRO CARB STEADY)  1,000 mL Per Tube Q24H  . feeding supplement (PRO-STAT SUGAR FREE 64)  30 mL Per Tube BID  . ferric gluconate (FERRLECIT/NULECIT) IV  125 mg Intravenous Q M,W,F-HD  . insulin aspart  2-6 Units Subcutaneous 6 times per day  .  potassium chloride  10 mEq Intravenous Once  . sodium chloride  3 mL Intravenous Q12H    Continuous Infusions: . sodium chloride 10 mL/hr at 07/09/13 2115  . fentaNYL infusion INTRAVENOUS 75 mcg/hr (07/12/13 0800)  . fentaNYL infusion INTRAVENOUS 10 mcg/hr (07/13/13 0000)  . heparin 850 Units/hr (07/13/13 0202)  . midazolam (VERSED) infusion 3 mg/hr (07/13/13 0533)  . norepinephrine (LEVOPHED) Adult infusion 2 mcg/min (07/13/13 0855)     Joaquin CourtsKimberly Mickie Badders, RD, LDN, CNSC Pager [phone] After Hours Pager [phone]

## 2013-07-13 NOTE — CV Procedure (Signed)
CARDIAC CATHETERIZATION AND PERCUTANEOUS CORONARY INTERVENTION REPORT  NAMEAustin Walter   MRN: [id number] DOB:  01-Jul-1933   ADMIT DATE: 07/09/2013 Procedure Date: 07/13/2013  INTERVENTIONAL CARDIOLOGIST: Leonie Man, M.D., MS PRIMARY CARE PROVIDER: Maryella Shivers, MD PRIMARY CARDIOLOGIST: Troy Sine, MD or PETER M. Martinique, MD  PATIENT:  [name] is a 80 y.o. male with hx of HTN, DM, PAD (s/p left fem-pop) and ESRD on HD starting since around March 2010. Patient presented to [facility] with SOB and abd pain. CXR showed diffuse bilat pulm edema. Pt intubated and transferred to [facility]. Initial Troponins were negative; BNP 54473. He has been undergoing dialysis on 4/6,7 and today. Yesterday, patient self extubated himself, then later developed marked respiratory distress, complained of chest pain and required re-intubation. Troponin today has increased to 9.94 without acute ST elevation c/w NSTEMI along with acute pulmonary edema.  He underwent diagnostic cardiac catheterization by Dr. Martinique yesterday (07/12/2012) that demonstrated severe multivessel disease including a proximal RCA 80% lesion, mid LAD focal 80-90% lesion followed by 60% lesion more distally. There is also significant lesions throughout the circumflex system. CT surgical consultation was obtained, and felt that he was not safe for CABG at this time. The patient continued to be having difficulty with extubation secondary to was thought to be ischemia related diastolic heart failure mediated respiratory distress.  After reviewing the diagnostic angiography with Dr. Martinique, we determined the following: LAD and RCA disease appears suitable for stenting. LCx/OM disease is not as well suited due to bifurcation and small vessel disease. Patient is not a surgical candidate. Plan PCI of the LAD and RCA today to reduce his ischemic burden and help with ventilator weaning.  Tube feeds were held, he was continued on  Plavix. Dr. Martinique obtained telephonic consent from the patient's daughter [name]. He is now referred for 2 vessel PCI.  PRE-OPERATIVE DIAGNOSIS:    NSTEMI  MULTIVESSEL CAD  UNABLE TO EXTUBATE DUE TO ISCHEMIA MEDIATED RESPIRATORY DISTRESS FROM DIASTOLIC DYSFUNCTION  PROCEDURES PERFORMED:    LEFT HEART CATHETERIZATION  PERCUTANEOUS CORONARY INTERVENTION OF MID LAD 80-90% LESION WITH XIENCE ALPINE DES 2.75 MM X 18 MM --> 3.0 MM  PERCUTANEOUS CORONARY INTERVENTION OF THE PROXIMAL RCA 80% LESION WITH XIENCE ALPINE DES 3.0 MM X 12 MM --> 3.1 MM  PROCEDURE:Consent:  Risks of procedure as well as the alternatives and risks of each were explained to the (patient/caregiver).  Consent for procedure obtained. Telephone consent obtained by Dr. Martinique. Consent for signed by MD and patient with RN witness -- placed on chart.   PROCEDURE: The patient was brought to the 2nd [facility] in the fasting state and prepped and draped in the usual sterile fashion for Right groin as he is a HD patient.  Sterile technique was used including antiseptics, cap, gloves, gown, hand hygiene, mask and sheet.  Skin prep: Chlorhexidine.  Time Out: Verified patient identification, verified procedure, site/side was marked, verified correct patient position, special equipment/implants available, medications/allergies/relevent history reviewed, required imaging and test results available.  Performed  Access: Right Common Femoral Artery; 6 Fr Sheath -- fluoroscopically guided modified Seldinger technique  Diagnostic Left Heart Catheterization with Coronary Angiography:  6 Fr XB LAD & JR4 Guide catheters advanced and exchanged over standeard J-wire into the ascending aorta and used for selective coronary artery engagement.  Left Coronary Artery Angiography: XB LAD 3.5  Right Coronary Artery Angiography: JR4  LV Hemodynamics: JR4  Sheath:  Sutured in  place; to be removed in  MICU  Hemodynamics:  Central Aortic / Mean Pressures: 175/49 mmHg; 89 mmHg  Left Ventricular Pressures / EDP: 181/13 mmHg; 22 mmHg  Coronary Anatomy:  Left Main: Mild ostial narrowing of 20%.  Left Anterior Descending (LAD): 80-90% stenosis immediately after the first septal perforator (lesion # 1). 60-70% focal stenosis after the second septal perforator. The first diagonal is small with 80% ostial stenosis. The second diagonal is very small with 70-80% ostial disease.   Left Circumflex (LCx): The LCx bifurcates into 2 OM branches. Just prior to the bifurcation there is a 90% stenosis. This extends into the proximal OM1 up to 95%. The OM1 bifurcates distally and the more lateral branch is occluded with collaterals. There is an 80% stenosis in the second OM.    Right Coronary Artery (RCA): There is an 80% proximal stenosis. There is 50% disease distally prior to the PDA/PLOM bifurcation.  After reviewing the initial angiography, the most approachable lesions were thought to be the LAD & RCA lesions.  Preparation were made to proceed with PCI on this lesion.  Percutaneous Coronary Intervention:  Sheath exchanged for 6 Fr  Lesion #1: Mid LAD 80-90% after SP1 -- reduced to 0%; TIMI-3 flow pre-and post  Guide: 6 Fr    XB LAD 3.5  Guidewire:  BMW  Predilation Balloon: Sprinter Legend 2.5  mm x  12 mm;    6 Atm x 30  Sec,   Drug-Eluting Stent:  Xience Alpine DES 2.75 mm x  18  mm;   Deployed at 12 Atm x  30  Sec,   Postdilated with stent balloon: 18  Atm x 45 Sec Post-dilation Balloon: [place] Emerge   3.0 mm x  12  mm;   16 Atm x  45  Sec - or 2 inflations proximal and distal   Final Diameter: 3.0 mm   Post deployment angiography in multiple views, with and without guidewire in place revealed excellent stent deployment and lesion coverage.  There was no evidence of dissection or perforation.Attention was then turned to Lesion #2   Lesion #2:  Proximal RCA 80% -- reduced to 0%, TIMI-3  flow pre-and post  Guide: 6 Fr    JR 4  Guidewire:  BMW  Predilation Balloon:  Sprinter Legend 2.5  mm x  12 mm;   10 Atm x  30 Sec Drug-Eluting Stent: Xience Alpine DES 2.75 mm x 12 mm;   Deployment:   12 Atm x 30 Sec,   Post-dilation with Stent Balloon: 16 Atm x  30  Sec  Final Diameter: 3.1 mm  Post deployment angiography in multiple views, with and without guidewire in place revealed excellent stent deployment and lesion coverage.  There was no evidence of dissection or perforation.  MEDICATIONS:  Anesthesia:  Local Lidocaine 16 ml  Sedation:  7 mg IV Versed, 100 mcg IV fentanyl ;   Isovue Contrast: 130 ml  Anticoagulation:  Angiomax Bolus & drip  Anti-Platelet Agent:  Plavix + ASA  IV Hydralazine 20 mg x 1  PATIENT DISPOSITION:    The patient was transferred to the PACU holding area in a hemodynamicaly stable, chest pain free condition.  The patient tolerated the procedure well, and there were no complications.  EBL:   < 20 ml  The patient was stable before, during, and after the procedure.  POST-OPERATIVE DIAGNOSIS:    Successful PCI of mid LAD ~80-90% focal stenosis @ SP1 - Xience Alpine DES 2.75 mm  x 18 mm --> post-dilated to 3.0 mm  Successful PCI of proximal RCA ~80% lesion with Xience Alpine DES 3.0 mm x 12 mm --> post-dilated to 3.1 mm  Elevated LVEDP of 22 mmHG with significant systemic hypertension & wide pulse pressure.  PLAN OF CARE:  Return to MICU for continued care.  Sheath to be pulled 2 hr post PCI Angiomax discontinuation.  Would strongly consider re-initiating BP medications (BB & afterload reduction agent - ACE-I/ARB or Hydralazine + Nitrate.  Can likely wean pressor support  Would remove more volume based upon LVEDP elevation.   Leonie Man, M.D., M.S. [facility] [address] [place], [place]  [address]  [phone]  07/13/2013 2:38 PM

## 2013-07-13 NOTE — Progress Notes (Signed)
ANTICOAGULATION CONSULT NOTE - Follow Up Consult  Pharmacy Consult for Heparin Indication: 3VCAD  No Known Allergies  Patient Measurements: Height: 5\' 4"  (162.6 cm) Weight: 135 lb 2.3 oz (61.3 kg) IBW/kg (Calculated) : 59.2 Heparin Dosing Weight: 61.3 kg  Vital Signs: Temp: 99.6 F (37.6 C) (04/09 1240) Temp src: Oral (04/09 1240) BP: 176/52 mmHg (04/09 1251) Pulse Rate: 74 (04/09 1251)  Labs:  Recent Labs  07/11/13 0246  07/11/13 2200 07/12/13 0030 07/12/13 0255 07/13/13 0500  HGB 10.6*  --   --   --   --  10.8*  HCT 31.6*  --   --   --   --  32.5*  PLT PLATELET CLUMPS NOTED ON SMEAR, COUNT APPEARS ADEQUATE  --   --   --   --  209  APTT  --   --   --  46*  --   --   LABPROT  --   --   --  14.8  --  14.2  INR  --   --   --  1.19  --  1.12  HEPARINUNFRC  --   --   --  <0.10*  --   --   CREATININE 6.18*  --   --   --  5.16* 5.63*  TROPONINI  --   < > 5.35*  --  9.94* 5.40*  < > = values in this interval not displayed.  Estimated Creatinine Clearance: 9.2 ml/min (by C-G formula based on Cr of 5.63).   Assessment: [age] YOM found to have elevated troponins of 9.9 to 5.4 on 4/8-4/9.  Patient was taken to CATH yesterday and found to have 3 vessel occlusion CAD.  Heparin was resumed 4/9 at 0200, which is 8 hours post-sheath removal.  Spoke to RN and patient is to be taken to CATH again for stenting at around 1330 today.  HL was on order for today, but issues with collection again.  Since patient is to go to CATH soon, HL was canceled.  Cardiology plans to stop heparin right before CATH and not restart after PCA.  Patient's hemoglobin and hematocrit has been low and stable, and platelets are normal.  No significant bleeding or complaints per nurse.   Goal of Therapy:  Heparin level 0.3-0.7 units/ml Monitor platelets by anticoagulation protocol: Yes   Plan:  -Stop heparin level today and daily  Anabel BeneSendra Kylan Liberati, PharmD Clinical Pharmacist Pager: [phone]  Anabel BeneSendra  Shaydon Lease 07/13/2013,12:56 PM

## 2013-07-13 NOTE — Progress Notes (Signed)
TELEMETRY: Reviewed telemetry pt in NSR: Filed Vitals:   07/13/13 0742 07/13/13 0800 07/13/13 0900 07/13/13 0905  BP: 111/33 127/35 161/45   Pulse: 67 62 69   Temp:    99.5 F (37.5 C)  TempSrc:    Oral  Resp: 16 16 16    Height:      Weight:      SpO2: 100% 100% 100%     Intake/Output Summary (Last 24 hours) at 07/13/13 1021 Last data filed at 07/13/13 0955  Gross per 24 hour  Intake 1040.05 ml  Output   3000 ml  Net -1959.95 ml    SUBJECTIVE Patient intubated and sedated.  LABS: Basic Metabolic Panel:  Recent Labs  13/08/65 1940 07/11/13 2200 07/12/13 0255 07/13/13 0500  NA  --   --  141 142  K  --   --  4.1 3.3*  CL  --   --  97 96  CO2  --   --  24 19  GLUCOSE  --   --  122* 177*  BUN  --   --  25* 36*  CREATININE  --   --  5.16* 5.63*  CALCIUM  --   --  8.3* 8.1*  MG 2.1 2.1  --   --   PHOS 2.5 4.0  --   --    Liver Function Tests:  Recent Labs  07/11/13 1940  AST 36  ALT 20  ALKPHOS 85  BILITOT 0.5  PROT 7.9  ALBUMIN 3.8   No results found for this basename: LIPASE, AMYLASE,  in the last 72 hours CBC:  Recent Labs  07/11/13 0246 07/13/13 0500  WBC 12.2* 11.0*  HGB 10.6* 10.8*  HCT 31.6* 32.5*  MCV 97.2 96.7  PLT PLATELET CLUMPS NOTED ON SMEAR, COUNT APPEARS ADEQUATE 209   Cardiac Enzymes:  Recent Labs  07/11/13 2200 07/12/13 0255 07/13/13 0500  TROPONINI 5.35* 9.94* 5.40*   Fasting Lipid Panel:  Recent Labs  07/12/13 1847  TRIG 133    Radiology/Studies:  Dg Chest Port 1 View  07/13/2013   CLINICAL DATA:  Check endotracheal tube position  EXAM: PORTABLE CHEST - 1 VIEW  COMPARISON:  07/12/2013  FINDINGS: Endotracheal tube ends in the mid to lower thoracic trachea, 3 cm above the carina. Orogastric tube enters the stomach. Unchanged right IJ catheter, tip at the lower SVC.  No cardiomegaly. Stable upper mediastinal contours. Unchanged haziness of the lower chest, consistent with layering pleural fluid. Pulmonary venous  congestion. Pulmonary edema seen previously has resolved. No pneumothorax.  IMPRESSION: 1. Stable positioning of tubes and lines. 2. Layering bilateral pleural effusions.   Electronically Signed   By: Tiburcio Pea M.D.   On: 07/13/2013 06:58   Dg Chest Port 1 View  07/12/2013   CLINICAL DATA:  Edema.  Renal failure.  EXAM: PORTABLE CHEST - 1 VIEW  COMPARISON:  07/11/2013  FINDINGS: Endotracheal tube remains in place with tip approximately 3 cm above the carina. Right jugular central venous catheter remains in place with tip overlying the lower SVC. Enteric tube courses into the left upper abdomen, incompletely imaged. The cardiac silhouette remains mildly enlarged. Thoracic aortic calcification is noted. Diffuse bilateral pulmonary opacities appear slightly improved since the prior study. Small bilateral pleural effusions also may have decreased slightly in size. No pneumothorax is identified.  IMPRESSION: Mild interval improvement in pulmonary edema and small bilateral pleural effusions.   Electronically Signed   By: Sebastian Ache   On: 07/12/2013  11:18   Dg Chest Port 1 View  07/11/2013   CLINICAL DATA:  Shortness of breath.  EXAM: PORTABLE CHEST - 1 VIEW  COMPARISON:  Portable film earlier in the day.  FINDINGS: ET tube 3.9 cm above carina. Central venous catheter tip from right IJ approach lies at the SVC RA junction. The heart is enlarged. There is widespread bilateral pulmonary opacities consistent with pulmonary edema. Bilateral effusions. Worsening aeration compared with priors. No visible pneumothorax.  IMPRESSION: Worsening aeration.  Probable developing pulmonary edema.   Electronically Signed   By: Davonna BellingJohn  Curnes M.D.   On: 07/11/2013 17:07   Dg Chest Port 1 View  07/11/2013   CLINICAL DATA:  Chest pain.  Endotracheal tube position  EXAM: PORTABLE CHEST - 1 VIEW  COMPARISON:  07/11/2013  FINDINGS: Endotracheal tube in good position.  NG tube has been removed.  Improvement in edema and bilateral  effusions since earlier today. There remains mild edema and small effusions. Improvement in bibasilar atelectasis.  IMPRESSION: Endotracheal tube in good position. Improvement in pulmonary edema and bilateral effusions.   Electronically Signed   By: Marlan Palauharles  Clark M.D.   On: 07/11/2013 15:50   Dg Chest Port 1 View  07/11/2013   CLINICAL DATA:  Pneumonia  EXAM: PORTABLE CHEST - 1 VIEW  COMPARISON:  07/10/2013  FINDINGS: The endotracheal tube is noted in satisfactory position. The nasogastric catheter is seen within the stomach. The cardiac shadow is stable. Bilateral pleural effusions are again identified. Vascular congestion is again seen.  IMPRESSION: The overall appearance has worsened in the interval with increasing bilateral pleural effusions.   Electronically Signed   By: Alcide CleverMark  Lukens M.D.   On: 07/11/2013 08:10   Dg Chest Port 1 View  07/10/2013   CLINICAL DATA:  Endotracheal tube position.  EXAM: PORTABLE CHEST - 1 VIEW  COMPARISON:  07/09/2013.  FINDINGS: Endotracheal tube tip 1.5 cm above the carina. Recommend retracting by 2.5 cm to avoid mainstem bronchus intubation with change in patient's neck position.  Diffuse airspace disease most notable mid-lower lung zones and greater on right. This may represent asymmetric pulmonary edema although infectious infiltrate not excluded in the proper clinical setting. Slight worsening of airspace disease right lung base.  Difficult to adequately assess cardiac size secondary to airspace disease.  Calcified mildly tortuous aorta.  Nasogastric tube courses below the diaphragm. Tip is not included on the present exam.  No gross pneumothorax.  IMPRESSION: Endotracheal tube tip 1.5 cm above the carina. Recommend retracting by 2.5 cm to avoid mainstem bronchus intubation with change in patient's neck position.  Slight worsening of airspace disease right lung base. Please see above.  This is a call report.   Electronically Signed   By: Bridgett LarssonSteve  Olson M.D.   On: 07/10/2013  07:24   Dg Chest Port 1 View  07/09/2013   CLINICAL DATA:  Hypoxia  EXAM: PORTABLE CHEST - 1 VIEW  COMPARISON:  None.  FINDINGS: Endotracheal tube tip is at the carina. No pneumothorax. There is cardiomegaly with mild interstitial edema. There is consolidation in both bases. There is pulmonary venous hypertension. No adenopathy.  IMPRESSION: Endotracheal tube tip is at the carina. Advise withdrawing endotracheal tube approximately 3 cm. There is underlying congestive heart failure. There is airspace consolidation in both bases. This opacity may represent alveolar edema. Superimposed pneumonia, however, cannot be excluded. Congestive heart failure and pneumonia may exist concurrently.   Electronically Signed   By: Bretta BangWilliam  Woodruff M.D.   On: 07/09/2013  17:06   Dg Abd Portable 1v  07/09/2013   CLINICAL DATA:  NG tube placement  EXAM: PORTABLE ABDOMEN - 1 VIEW  COMPARISON:  None.  FINDINGS: Moderate gastric distention with gas. NG tube with tip in proximal stomach.  IMPRESSION: NG tube in place with tip in proximal stomach. Moderate gaseous distension of the stomach.   Electronically Signed   By: Natasha Mead M.D.   On: 07/09/2013 18:44    PHYSICAL EXAM General: Chronically ill appearing, on vent,  in no acute distress. Head: Normocephalic, atraumatic Neck: Negative for carotid bruits. JVD not elevated. Lungs: Bilateral rhonchi. Breathing is unlabored. Heart: RRR S1 S2 with 1/6 systolic murmur: no rubs, or gallops.  Abdomen: Soft, non-tender, non-distended with normoactive bowel sounds. No hepatomegaly. No rebound/guarding. No obvious abdominal masses. Extremities: No clubbing, cyanosis or edema.  Distal pedal pulses are 2+ and equal bilaterally. No hematoma in the right groin. Neuro: sedated on vent.   ASSESSMENT AND PLAN: 1. Acute respiratory failure with pulmonary edema. Volume status improved and right heart/LV filling pressures normal yesterday. Pulmonary edema related to acute diastolic  dysfunction and cardiac ischemia. 2. NSTEMI. 3 vessel CAD by cath. LAD and RCA disease appears suitable for stenting. LCx/OM disease is not as well suited due to bifurcation and small vessel disease. Patient is not a surgical candidate. Plan PCI of the LAD and RCA today to reduce his ischemic burden and help with ventilator weaning. Will hold tube feeds today. Resume plavix. I obtained consent from his daughter Shanda Bumps by phone and witnessed by nursing staff. The procedure and risks were reviewed including but not limited to death, myocardial infarction, stroke, arrythmias, bleeding, transfusion, emergency surgery, dye allergy, or renal dysfunction. The daughter voices understanding and is agreeable to proceed. 3. ESRD on dialysis. 4. HTN 5. Chronic anemia 6. PAD s/p left fem/pop bypass 7. DM on insulin.   Principal Problem:   Acute encephalopathy Active Problems:   Peripheral vascular disease, unspecified   ESRD on dialysis   Unspecified essential hypertension   Type II or unspecified type diabetes mellitus with peripheral circulatory disorders, uncontrolled(250.72)   Acute respiratory failure    Signed, Christne Platts M [place] MD,FACC 07/13/2013 10:29 AM

## 2013-07-13 NOTE — H&P (View-Only) (Signed)
TELEMETRY: Reviewed telemetry pt in NSR: Filed Vitals:   07/13/13 0742 07/13/13 0800 07/13/13 0900 07/13/13 0905  BP: 111/33 127/35 161/45   Pulse: 67 62 69   Temp:    99.5 F (37.5 C)  TempSrc:    Oral  Resp: 16 16 16    Height:      Weight:      SpO2: 100% 100% 100%     Intake/Output Summary (Last 24 hours) at 07/13/13 1021 Last data filed at 07/13/13 0955  Gross per 24 hour  Intake 1040.05 ml  Output   3000 ml  Net -1959.95 ml    SUBJECTIVE Patient intubated and sedated.  LABS: Basic Metabolic Panel:  Recent Labs  13/08/65 1940 07/11/13 2200 07/12/13 0255 07/13/13 0500  NA  --   --  141 142  K  --   --  4.1 3.3*  CL  --   --  97 96  CO2  --   --  24 19  GLUCOSE  --   --  122* 177*  BUN  --   --  25* 36*  CREATININE  --   --  5.16* 5.63*  CALCIUM  --   --  8.3* 8.1*  MG 2.1 2.1  --   --   PHOS 2.5 4.0  --   --    Liver Function Tests:  Recent Labs  07/11/13 1940  AST 36  ALT 20  ALKPHOS 85  BILITOT 0.5  PROT 7.9  ALBUMIN 3.8   No results found for this basename: LIPASE, AMYLASE,  in the last 72 hours CBC:  Recent Labs  07/11/13 0246 07/13/13 0500  WBC 12.2* 11.0*  HGB 10.6* 10.8*  HCT 31.6* 32.5*  MCV 97.2 96.7  PLT PLATELET CLUMPS NOTED ON SMEAR, COUNT APPEARS ADEQUATE 209   Cardiac Enzymes:  Recent Labs  07/11/13 2200 07/12/13 0255 07/13/13 0500  TROPONINI 5.35* 9.94* 5.40*   Fasting Lipid Panel:  Recent Labs  07/12/13 1847  TRIG 133    Radiology/Studies:  Dg Chest Port 1 View  07/13/2013   CLINICAL DATA:  Check endotracheal tube position  EXAM: PORTABLE CHEST - 1 VIEW  COMPARISON:  07/12/2013  FINDINGS: Endotracheal tube ends in the mid to lower thoracic trachea, 3 cm above the carina. Orogastric tube enters the stomach. Unchanged right IJ catheter, tip at the lower SVC.  No cardiomegaly. Stable upper mediastinal contours. Unchanged haziness of the lower chest, consistent with layering pleural fluid. Pulmonary venous  congestion. Pulmonary edema seen previously has resolved. No pneumothorax.  IMPRESSION: 1. Stable positioning of tubes and lines. 2. Layering bilateral pleural effusions.   Electronically Signed   By: Tiburcio Pea M.D.   On: 07/13/2013 06:58   Dg Chest Port 1 View  07/12/2013   CLINICAL DATA:  Edema.  Renal failure.  EXAM: PORTABLE CHEST - 1 VIEW  COMPARISON:  07/11/2013  FINDINGS: Endotracheal tube remains in place with tip approximately 3 cm above the carina. Right jugular central venous catheter remains in place with tip overlying the lower SVC. Enteric tube courses into the left upper abdomen, incompletely imaged. The cardiac silhouette remains mildly enlarged. Thoracic aortic calcification is noted. Diffuse bilateral pulmonary opacities appear slightly improved since the prior study. Small bilateral pleural effusions also may have decreased slightly in size. No pneumothorax is identified.  IMPRESSION: Mild interval improvement in pulmonary edema and small bilateral pleural effusions.   Electronically Signed   By: Sebastian Ache   On: 07/12/2013  11:18   Dg Chest Port 1 View  07/11/2013   CLINICAL DATA:  Shortness of breath.  EXAM: PORTABLE CHEST - 1 VIEW  COMPARISON:  Portable film earlier in the day.  FINDINGS: ET tube 3.9 cm above carina. Central venous catheter tip from right IJ approach lies at the SVC RA junction. The heart is enlarged. There is widespread bilateral pulmonary opacities consistent with pulmonary edema. Bilateral effusions. Worsening aeration compared with priors. No visible pneumothorax.  IMPRESSION: Worsening aeration.  Probable developing pulmonary edema.   Electronically Signed   By: Davonna BellingJohn  Curnes M.D.   On: 07/11/2013 17:07   Dg Chest Port 1 View  07/11/2013   CLINICAL DATA:  Chest pain.  Endotracheal tube position  EXAM: PORTABLE CHEST - 1 VIEW  COMPARISON:  07/11/2013  FINDINGS: Endotracheal tube in good position.  NG tube has been removed.  Improvement in edema and bilateral  effusions since earlier today. There remains mild edema and small effusions. Improvement in bibasilar atelectasis.  IMPRESSION: Endotracheal tube in good position. Improvement in pulmonary edema and bilateral effusions.   Electronically Signed   By: Marlan Palauharles  Clark M.D.   On: 07/11/2013 15:50   Dg Chest Port 1 View  07/11/2013   CLINICAL DATA:  Pneumonia  EXAM: PORTABLE CHEST - 1 VIEW  COMPARISON:  07/10/2013  FINDINGS: The endotracheal tube is noted in satisfactory position. The nasogastric catheter is seen within the stomach. The cardiac shadow is stable. Bilateral pleural effusions are again identified. Vascular congestion is again seen.  IMPRESSION: The overall appearance has worsened in the interval with increasing bilateral pleural effusions.   Electronically Signed   By: Alcide CleverMark  Lukens M.D.   On: 07/11/2013 08:10   Dg Chest Port 1 View  07/10/2013   CLINICAL DATA:  Endotracheal tube position.  EXAM: PORTABLE CHEST - 1 VIEW  COMPARISON:  07/09/2013.  FINDINGS: Endotracheal tube tip 1.5 cm above the carina. Recommend retracting by 2.5 cm to avoid mainstem bronchus intubation with change in patient's neck position.  Diffuse airspace disease most notable mid-lower lung zones and greater on right. This may represent asymmetric pulmonary edema although infectious infiltrate not excluded in the proper clinical setting. Slight worsening of airspace disease right lung base.  Difficult to adequately assess cardiac size secondary to airspace disease.  Calcified mildly tortuous aorta.  Nasogastric tube courses below the diaphragm. Tip is not included on the present exam.  No gross pneumothorax.  IMPRESSION: Endotracheal tube tip 1.5 cm above the carina. Recommend retracting by 2.5 cm to avoid mainstem bronchus intubation with change in patient's neck position.  Slight worsening of airspace disease right lung base. Please see above.  This is a call report.   Electronically Signed   By: Bridgett LarssonSteve  Olson M.D.   On: 07/10/2013  07:24   Dg Chest Port 1 View  07/09/2013   CLINICAL DATA:  Hypoxia  EXAM: PORTABLE CHEST - 1 VIEW  COMPARISON:  None.  FINDINGS: Endotracheal tube tip is at the carina. No pneumothorax. There is cardiomegaly with mild interstitial edema. There is consolidation in both bases. There is pulmonary venous hypertension. No adenopathy.  IMPRESSION: Endotracheal tube tip is at the carina. Advise withdrawing endotracheal tube approximately 3 cm. There is underlying congestive heart failure. There is airspace consolidation in both bases. This opacity may represent alveolar edema. Superimposed pneumonia, however, cannot be excluded. Congestive heart failure and pneumonia may exist concurrently.   Electronically Signed   By: Bretta BangWilliam  Woodruff M.D.   On: 07/09/2013  17:06   Dg Abd Portable 1v  07/09/2013   CLINICAL DATA:  NG tube placement  EXAM: PORTABLE ABDOMEN - 1 VIEW  COMPARISON:  None.  FINDINGS: Moderate gastric distention with gas. NG tube with tip in proximal stomach.  IMPRESSION: NG tube in place with tip in proximal stomach. Moderate gaseous distension of the stomach.   Electronically Signed   By: Natasha Mead M.D.   On: 07/09/2013 18:44    PHYSICAL EXAM General: Chronically ill appearing, on vent,  in no acute distress. Head: Normocephalic, atraumatic Neck: Negative for carotid bruits. JVD not elevated. Lungs: Bilateral rhonchi. Breathing is unlabored. Heart: RRR S1 S2 with 1/6 systolic murmur: no rubs, or gallops.  Abdomen: Soft, non-tender, non-distended with normoactive bowel sounds. No hepatomegaly. No rebound/guarding. No obvious abdominal masses. Extremities: No clubbing, cyanosis or edema.  Distal pedal pulses are 2+ and equal bilaterally. No hematoma in the right groin. Neuro: sedated on vent.   ASSESSMENT AND PLAN: 1. Acute respiratory failure with pulmonary edema. Volume status improved and right heart/LV filling pressures normal yesterday. Pulmonary edema related to acute diastolic  dysfunction and cardiac ischemia. 2. NSTEMI. 3 vessel CAD by cath. LAD and RCA disease appears suitable for stenting. LCx/OM disease is not as well suited due to bifurcation and small vessel disease. Patient is not a surgical candidate. Plan PCI of the LAD and RCA today to reduce his ischemic burden and help with ventilator weaning. Will hold tube feeds today. Resume plavix. I obtained consent from his daughter Shanda Bumps by phone and witnessed by nursing staff. The procedure and risks were reviewed including but not limited to death, myocardial infarction, stroke, arrythmias, bleeding, transfusion, emergency surgery, dye allergy, or renal dysfunction. The daughter voices understanding and is agreeable to proceed. 3. ESRD on dialysis. 4. HTN 5. Chronic anemia 6. PAD s/p left fem/pop bypass 7. DM on insulin.   Principal Problem:   Acute encephalopathy Active Problems:   Peripheral vascular disease, unspecified   ESRD on dialysis   Unspecified essential hypertension   Type II or unspecified type diabetes mellitus with peripheral circulatory disorders, uncontrolled(250.72)   Acute respiratory failure    Signed, Imre Vecchione M [place] MD,FACC 07/13/2013 10:29 AM

## 2013-07-14 ENCOUNTER — Inpatient Hospital Stay (HOSPITAL_COMMUNITY): Payer: Medicare Other

## 2013-07-14 LAB — BASIC METABOLIC PANEL
BUN: 52 mg/dL — AB (ref 6–23)
CALCIUM: 7.9 mg/dL — AB (ref 8.4–10.5)
CO2: 21 mEq/L (ref 19–32)
Chloride: 94 mEq/L — ABNORMAL LOW (ref 96–112)
Creatinine, Ser: 8.27 mg/dL — ABNORMAL HIGH (ref 0.50–1.35)
GFR, EST AFRICAN AMERICAN: 6 mL/min — AB (ref >90)
GFR, EST NON AFRICAN AMERICAN: 5 mL/min — AB (ref >90)
GLUCOSE: 144 mg/dL — AB (ref 70–99)
POTASSIUM: 3.5 meq/L — AB (ref 3.7–5.3)
Sodium: 137 mEq/L (ref 137–147)

## 2013-07-14 LAB — CBC
HCT: 27.2 % — ABNORMAL LOW (ref 39.0–52.0)
HEMOGLOBIN: 9.3 g/dL — AB (ref 13.0–17.0)
MCH: 32.3 pg (ref 26.0–34.0)
MCHC: 34.2 g/dL (ref 30.0–36.0)
MCV: 94.4 fL (ref 78.0–100.0)
Platelets: 200 10*3/uL (ref 150–400)
RBC: 2.88 MIL/uL — ABNORMAL LOW (ref 4.22–5.81)
RDW: 14.9 % (ref 11.5–15.5)
WBC: 10.1 10*3/uL (ref 4.0–10.5)

## 2013-07-14 LAB — MAGNESIUM: MAGNESIUM: 2.3 mg/dL (ref 1.5–2.5)

## 2013-07-14 LAB — GLUCOSE, CAPILLARY
GLUCOSE-CAPILLARY: 98 mg/dL (ref 70–99)
GLUCOSE-CAPILLARY: 243 mg/dL — AB (ref 70–99)
Glucose-Capillary: 139 mg/dL — ABNORMAL HIGH (ref 70–99)
Glucose-Capillary: 171 mg/dL — ABNORMAL HIGH (ref 70–99)
Glucose-Capillary: 183 mg/dL — ABNORMAL HIGH (ref 70–99)
Glucose-Capillary: 91 mg/dL (ref 70–99)

## 2013-07-14 LAB — POCT ACTIVATED CLOTTING TIME: Activated Clotting Time: 155 seconds

## 2013-07-14 LAB — PHOSPHORUS: Phosphorus: 4.7 mg/dL — ABNORMAL HIGH (ref 2.3–4.6)

## 2013-07-14 MED ORDER — CLOPIDOGREL BISULFATE 75 MG PO TABS
75.0000 mg | ORAL_TABLET | Freq: Every day | ORAL | Status: DC
  Administered 2013-07-15 – 2013-07-20 (×6): 75 mg via ORAL
  Filled 2013-07-14 (×8): qty 1

## 2013-07-14 MED ORDER — HEPARIN SODIUM (PORCINE) 1000 UNIT/ML IJ SOLN
2000.0000 [IU] | Freq: Once | INTRAMUSCULAR | Status: AC
  Administered 2013-07-18: 2000 [IU]
  Filled 2013-07-14: qty 2

## 2013-07-14 MED ORDER — BISACODYL 10 MG RE SUPP: 10.0000 mg | Freq: Every day | RECTAL | Status: DC

## 2013-07-14 MED ORDER — POTASSIUM CHLORIDE 10 MEQ/100ML IV SOLN: 10.0000 meq | INTRAVENOUS | Status: DC

## 2013-07-14 MED FILL — Sodium Chloride IV Soln 0.9%: INTRAVENOUS | Qty: 50 | Status: AC

## 2013-07-14 NOTE — Progress Notes (Signed)
[facility] Progress Note   Subjective: On vent, had PCI to LAD and RCA w/o complication yest. LVEDP was higher than on previous cath at 22.  Pulse pressure wide and MAP was low last night prompting vasopressor support transiently which is off now.    Filed Vitals:   07/14/13 0400 07/14/13 0417 07/14/13 0500 07/14/13 0600  BP: 131/44  100/30 112/29  Pulse: 80  65 67  Temp:  99 F (37.2 C)    TempSrc:  Oral    Resp: 19  16 16   Height:      Weight:      SpO2: 100%  100% 100%   Exam: On vent +JVD  Chest bilat occ rhonchi  RRR 2/6 SEM no RG  Abd soft, NTND  LE trace edema bilat  LUA AVF patent +bruit  Neuro sedated on vent  ECHO - no old echo on chart  Dialysis: MWF [facility]  3h 67kg (new dry wt 61-62 kg) 2/2.5 [place] P4 LUA AVF Heparin 2000  Hectorol none Epo 6200 Venofer 100 /wk on Wed  Assessment:  1 Resp failure- remains on vent, pulm edema resolved 2 NSTEMI / severe 3VCAD-  3 ESRD 4 HTN/volume- new dry wt 61kg approx 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  6 Anemia cont esa w darbe 40/wk  7 PAD hx L fempop  7 DM on insulin  Plan- HD today    Vinson Moselle MD  pager [phone]    cell [phone]  07/14/2013, 6:44 AM     Recent Labs Lab 07/11/13 0055 07/11/13 0246 07/11/13 1940 07/11/13 2200 07/12/13 0255 07/13/13 0500  NA  --  135*  --   --  141 142  K  --  5.3  --   --  4.1 3.3*  CL  --  89*  --   --  97 96  CO2  --  27  --   --  24 19  GLUCOSE  --  138*  --   --  122* 177*  BUN  --  32*  --   --  25* 36*  CREATININE  --  6.18*  --   --  5.16* 5.63*  CALCIUM  --  8.2*  --   --  8.3* 8.1*  PHOS 4.8*  --  2.5 4.0  --   --     Recent Labs Lab 07/11/13 1940  AST 36  ALT 20  ALKPHOS 85  BILITOT 0.5  PROT 7.9  ALBUMIN 3.8    Recent Labs Lab 07/11/13 0246 07/13/13 0500 07/14/13 0500  WBC 12.2* 11.0* 10.1  HGB 10.6* 10.8* 9.3*  HCT 31.6* 32.5* 27.2*  MCV 97.2 96.7 94.4  PLT PLATELET CLUMPS NOTED ON SMEAR, COUNT APPEARS ADEQUATE 209  200   . antiseptic oral rinse  15 mL Mouth Rinse QID  . aspirin  81 mg Per Tube Daily  . atorvastatin  40 mg Oral q1800  . chlorhexidine  15 mL Mouth Rinse BID  . darbepoetin (ARANESP) injection - DIALYSIS  40 mcg Intravenous Q Mon-HD  . famotidine  20 mg Per Tube Daily  . feeding supplement (NEPRO CARB STEADY)  1,000 mL Per Tube Q24H  . feeding supplement (PRO-STAT SUGAR FREE 64)  30 mL Per Tube BID  . ferric gluconate (FERRLECIT/NULECIT) IV  125 mg Intravenous Q M,W,F-HD  . heparin  2,000 Units Dialysis Once in dialysis  . heparin  5,000 Units Subcutaneous 3 times per day  . insulin aspart  2-6 Units Subcutaneous 6 times per day  . potassium chloride  10 mEq Intravenous Q1 Hr x 4  . sodium chloride  3 mL Intravenous Q12H   . sodium chloride 10 mL/hr at 07/09/13 2115  . sodium chloride    . fentaNYL infusion INTRAVENOUS 100 mcg/hr (07/13/13 1228)  . fentaNYL infusion INTRAVENOUS 100 mcg/hr (07/13/13 1636)  . midazolam (VERSED) infusion 3 mg/hr (07/14/13 0232)  . norepinephrine (LEVOPHED) Adult infusion 2 mcg/min (07/13/13 2300)   sodium chloride, sodium chloride, sodium chloride, sodium chloride, sodium chloride, dextrose, feeding supplement (NEPRO CARB STEADY), feeding supplement (NEPRO CARB STEADY), fentaNYL, fentaNYL, heparin, lidocaine (PF), lidocaine (PF), lidocaine-prilocaine, lidocaine-prilocaine, midazolam, morphine injection, pentafluoroprop-tetrafluoroeth, pentafluoroprop-tetrafluoroeth, sodium chloride

## 2013-07-14 NOTE — Progress Notes (Signed)
PULMONARY / CRITICAL CARE MEDICINE   Name: [name] MRN: [id number] DOB: 1933/11/17    ADMISSION DATE:  07/09/2013 CONSULTATION DATE:  4/5 PRIMARY SERVICE: PCCM  CHIEF COMPLAINT:  SOB and abdominal pain  BRIEF PATIENT DESCRIPTION:   Patient is a 53 man with acute onset SOB with history of ESRD on HD (MWF), PVD (s/p left fempop) & DM on insulin. Admitted for abdominal pain and SOB. Per [facility] chart review, patient has been without fever and cough.   He had a similar episode of SOB in December at [place] [facility], told it was not HF, and thought there was something wrong with his lungs. The  patient is constantly sleepy since Dec.  He has had similar SOB prior to Dec, thought to be due to heart failure. To note, patient lives alone. He has 5 children, but only speaks with 2, a daughter in [place] Soph[place], [phone] - primary contact) and son in [place].     SIGNIFICANT EVENTS / STUDIES:   07/09/13- CXR in [facility]: B/l pulm edema (CD with image in chart) 4/5-CXR: underlying congestive heart failure, airspace consolidation in both bases.  4/6-CXR: worsening of airspace disease right lung base.  4/6- Did HD 4/7-CXR: No significant change of airspace disease  4/7-had chest pain and leleved trop-->started heparin gtt 4/7-reintubated, chest pain, pos trop, NSTEMI 4/8-R and L cardiac cath-->three vessel disease. Cardiovascular surgeon consulted-->no CABG due to multiple comorbidities.  4/9-PCI of mid LAD ~80-90-->DES; proximal RCA ~80% lesion-->DES. Elevated LVEDP of 22 mmHG  4/10 - HD, off pressors  LINES / TUBES: ETT 07/09/13 >>>4/7 AM(self extubated) Re-ETT 4/7>> 4/7 Rt IJ>>>   CULTURES: 07/09/13 Blood Cx x2 >>> 07/09/13 Trach Aspiriate >>> negative 07/09/13 Resp Virus PCR Panel >> negative  ANTIBIOTICS: Vanc 4/5 >>>4/6 Zosyn 4/5 >>>4/6  SUBJECTIVE:  The patient is sedated and intubated. Tm 99.6   VITAL SIGNS: Temp:  [97.4 F (36.3 C)-99.6 F (37.6 C)] 99 F (37.2  C) (04/10 0417) Pulse Rate:  [59-80] 73 (04/10 0357) Resp:  [16-17] 16 (04/10 0357) BP: (93-176)/(28-52) 120/29 mmHg (04/10 0000) SpO2:  [100 %] 100 % (04/10 0357) FiO2 (%):  [40 %] 40 % (04/10 0357) HEMODYNAMICS:   VENTILATOR SETTINGS: Vent Mode:  [-] PRVC FiO2 (%):  [40 %] 40 % Set Rate:  [16 bmp] 16 bmp Vt Set:  [500 mL] 500 mL PEEP:  [5 cmH20] 5 cmH20 Plateau Pressure:  [13 cmH20-19 cmH20] 18 cmH20 INTAKE / OUTPUT: Intake/Output     04/09 0701 - 04/10 0700   I.V. (mL/kg) 429.4 (7)   NG/GT 300   IV Piggyback 50   Total Intake(mL/kg) 779.4 (12.7)   Stool 125   Total Output 125   Net +654.4         PHYSICAL EXAMINATION: General: intubated and sedated HEENT: PERRL (sluggish), EOMI, no scleral icterus, dentures in place Cardiac: RRR, no rubs, murmurs or gallops Pulm: CTAB Abd: soft, nontender, nondistended, BS normoactive Ext: warm and well perfused, no pedal edema, LUE dialysis access with +thrill Neuro: responsive to sternal rub.  LABS:  CBC  Recent Labs Lab 07/10/13 0500 07/11/13 0246 07/13/13 0500  WBC 12.8* 12.2* 11.0*  HGB 9.2* 10.6* 10.8*  HCT 28.1* 31.6* 32.5*  PLT 221 PLATELET CLUMPS NOTED ON SMEAR, COUNT APPEARS ADEQUATE 209   Coag's  Recent Labs Lab 07/12/13 0030 07/13/13 0500  APTT 46*  --   INR 1.19 1.12   BMET  Recent Labs Lab 07/11/13 0246 07/12/13 0255 07/13/13 0500  NA  135* 141 142  K 5.3 4.1 3.3*  CL 89* 97 96  CO2 27 24 19   BUN 32* 25* 36*  CREATININE 6.18* 5.16* 5.63*  GLUCOSE 138* 122* 177*   Electrolytes  Recent Labs Lab 07/11/13 0055 07/11/13 0246 07/11/13 1940 07/11/13 2200 07/12/13 0255 07/13/13 0500  CALCIUM  --  8.2*  --   --  8.3* 8.1*  MG 1.9  --  2.1 2.1  --   --   PHOS 4.8*  --  2.5 4.0  --   --    Sepsis Markers  Recent Labs Lab 07/09/13 1900  LATICACIDVEN 1.8  PROCALCITON 4.39   ABG  Recent Labs Lab 07/09/13 1611 07/11/13 1727 07/12/13 1359  PHART 7.424 7.348* 7.521*  PCO2ART 41.1  49.7* 39.2  PO2ART 68.0* 83.0 444.0*   Liver Enzymes  Recent Labs Lab 07/11/13 1940  AST 36  ALT 20  ALKPHOS 85  BILITOT 0.5  ALBUMIN 3.8   Cardiac Enzymes  Recent Labs Lab 07/10/13 0525 07/10/13 0910  07/11/13 2200 07/12/13 0255 07/13/13 0500  TROPONINI <0.30  --   < > 5.35* 9.94* 5.40*  PROBNP  --  54473.0*  --   --   --  47075.0*  < > = values in this interval not displayed. Glucose  Recent Labs Lab 07/13/13 0747 07/13/13 1208 07/13/13 1527 07/13/13 1902 07/13/13 2354 07/14/13 0416  GLUCAP 145* 130* 129* 145* 91 98    Imaging Dg Chest Port 1 View  07/13/2013   CLINICAL DATA:  Check endotracheal tube position  EXAM: PORTABLE CHEST - 1 VIEW  COMPARISON:  07/12/2013  FINDINGS: Endotracheal tube ends in the mid to lower thoracic trachea, 3 cm above the carina. Orogastric tube enters the stomach. Unchanged right IJ catheter, tip at the lower SVC.  No cardiomegaly. Stable upper mediastinal contours. Unchanged haziness of the lower chest, consistent with layering pleural fluid. Pulmonary venous congestion. Pulmonary edema seen previously has resolved. No pneumothorax.  IMPRESSION: 1. Stable positioning of tubes and lines. 2. Layering bilateral pleural effusions.   Electronically Signed   By: Tiburcio PeaJonathan  Watts M.D.   On: 07/13/2013 06:58   Dg Chest Port 1 View  07/12/2013   CLINICAL DATA:  Edema.  Renal failure.  EXAM: PORTABLE CHEST - 1 VIEW  COMPARISON:  07/11/2013  FINDINGS: Endotracheal tube remains in place with tip approximately 3 cm above the carina. Right jugular central venous catheter remains in place with tip overlying the lower SVC. Enteric tube courses into the left upper abdomen, incompletely imaged. The cardiac silhouette remains mildly enlarged. Thoracic aortic calcification is noted. Diffuse bilateral pulmonary opacities appear slightly improved since the prior study. Small bilateral pleural effusions also may have decreased slightly in size. No pneumothorax is  identified.  IMPRESSION: Mild interval improvement in pulmonary edema and small bilateral pleural effusions.   Electronically Signed   By: Sebastian AcheAllen  Grady   On: 07/12/2013 11:18    ASSESSMENT / PLAN:  PULMONARY A: Acute Hypoxemic Respiratory failure, ischemia playing a part, and overload -Procalcitonin 4.39 -Improving  P:   -cardiac cath on 4/8-->three vessel disease, not candidate for CABA; now day 1 s/p s/p PCA and stent placement in LAD and RCA = wean cpap5 ps 5 -for HD, then repeat weaning if successful consider extubation -SBTattempt, cpap ps 5 -pcxr for edema  CARDIOVASCULAR A: h/o HTN, PVD - NSTEM. R and L cath-->severe 3 vessel obstructive CAD. Per CTS, no CABG-->s/p PCA and stent placement in LAD and RCA -on  ASA and plavix, Lipitor - Eevated LVEDP of 22 mmHG  P:   - contineu ASA and plavix, Lipitor - start ACEI, and BB as bp tolerated - 2D echo pending - off pressor now-->may need in bp drops -plavix  ENAL A:  ESRD on HD P:   -Renal following, BUN 36 and cre 5.63 in Am - for HD volume off -cont Fosrenal per tube -kvo  GASTROINTESTINAL A: tolerating TF P:    -Famotidine - GI ppx -TF -may need dulc supp  HEMATOLOGIC A:  DVT prev, mi P:  -DVT ppx: Sq heparine -plavix Off hep drip  INFECTIOUS A:  No evidence infection.  P:    -off abx, observe  ENDOCRINE A:  DM   P:   -ICU Hyperglycemia protocol  NEUROLOGIC A:  Acute Encephalopathy - uremia vs benzo (?lorazepam at [place]), not hypercarbic P:   -HD  -fent -WUA planned with SBT  Lorretta Harp, MD PGY3, Internal Medicine Teaching Service Pager: [phone]   Global: wean after hd  I have personally obtained a history, examined the patient, evaluated laboratory and imaging results, formulated the assessment and plan and placed orders. CRITICAL CARE: The patient is critically ill with multiple organ systems failure and requires high complexity decision making for assessment and support, frequent  evaluation and titration of therapies, application of advanced monitoring technologies and extensive interpretation of multiple databases. Critical Care Time devoted to patient care services described in this note is 30 minutes.   Mcarthur Rossetti. Tyson Alias, MD, FACP Pgr: [phone] [facility]  Pulmonary and Critical Care Medicine [facility] Pager: [phone]  07/14/2013, 5:57 AM

## 2013-07-14 NOTE — Progress Notes (Signed)
RT Note-Started wean 14/5, per MD order, continue to monitor.

## 2013-07-14 NOTE — Progress Notes (Signed)
Hemodialysis done today. Removed 1675cc of a 2.5l goal due to hypotension when put in UF the last half of the dialysis Tx.

## 2013-07-14 NOTE — Progress Notes (Signed)
Attempted PS weaning 14/5, tolerates well, however does not follow commands. Placed to full support until assessed by MD.

## 2013-07-14 NOTE — Progress Notes (Signed)
Patient Name: [facility] Date of Encounter: 07/14/2013     Principal Problem:   Acute encephalopathy Active Problems:   Peripheral vascular disease, unspecified   ESRD on dialysis   Unspecified essential hypertension   Type II or unspecified type diabetes mellitus with peripheral circulatory disorders, uncontrolled(250.72)   Acute respiratory failure    SUBJECTIVE  Remains sedated on ventilator. Rhythm is stable. Tolerated dialysis today.  CURRENT MEDS . antiseptic oral rinse  15 mL Mouth Rinse QID  . aspirin  81 mg Per Tube Daily  . atorvastatin  40 mg Oral q1800  . chlorhexidine  15 mL Mouth Rinse BID  . [START ON 07/15/2013] clopidogrel  75 mg Oral Q breakfast  . darbepoetin (ARANESP) injection - DIALYSIS  40 mcg Intravenous Q Mon-HD  . famotidine  20 mg Per Tube Daily  . feeding supplement (NEPRO CARB STEADY)  1,000 mL Per Tube Q24H  . feeding supplement (PRO-STAT SUGAR FREE 64)  30 mL Per Tube BID  . ferric gluconate (FERRLECIT/NULECIT) IV  125 mg Intravenous Q M,W,F-HD  . heparin  2,000 Units Intracatheter Once  . heparin  5,000 Units Subcutaneous 3 times per day  . insulin aspart  2-6 Units Subcutaneous 6 times per day    OBJECTIVE  Filed Vitals:   07/14/13 1245 07/14/13 1300 07/14/13 1320 07/14/13 1345  BP: 80/41 114/41 134/44 118/39  Pulse: 84 91 88   Temp:   98.2 F (36.8 C)   TempSrc:  Oral Oral   Resp: 18 18 21 17   Height:      Weight:      SpO2: 100% 100% 100%     Intake/Output Summary (Last 24 hours) at 07/14/13 1347 Last data filed at 07/14/13 1320  Gross per 24 hour  Intake 986.73 ml  Output   1800 ml  Net -813.27 ml   Filed Weights   07/12/13 0700 07/14/13 0500 07/14/13 1010  Weight: 135 lb 2.3 oz (61.3 kg) 135 lb 12.9 oz (61.6 kg) 137 lb 2 oz (62.2 kg)    PHYSICAL EXAM  General: Intubated. Sedated. Neuro: Sedated Psych:  HEENT:  Normal  Neck: Supple without bruits or JVD. Lungs:  Resp regular and unlabored, CTA. Heart:  RRR no s3, s4, or murmurs. Abdomen: Soft, non-tender, non-distended, BS + x 4.  Extremities: No edema.  Accessory Clinical Findings  CBC  Recent Labs  07/13/13 0500 07/14/13 0500  WBC 11.0* 10.1  HGB 10.8* 9.3*  HCT 32.5* 27.2*  MCV 96.7 94.4  PLT 209 200   Basic Metabolic Panel  Recent Labs  07/11/13 2200  07/13/13 0500 07/14/13 0500 07/14/13 0700  NA  --   < > 142 137  --   K  --   < > 3.3* 3.5*  --   CL  --   < > 96 94*  --   CO2  --   < > 19 21  --   GLUCOSE  --   < > 177* 144*  --   BUN  --   < > 36* 52*  --   CREATININE  --   < > 5.63* 8.27*  --   CALCIUM  --   < > 8.1* 7.9*  --   MG 2.1  --   --   --  2.3  PHOS 4.0  --   --   --  4.7*  < > = values in this interval not displayed. Liver Function Tests  Recent Labs  07/11/13  1940  AST 36  ALT 20  ALKPHOS 85  BILITOT 0.5  PROT 7.9  ALBUMIN 3.8   No results found for this basename: LIPASE, AMYLASE,  in the last 72 hours Cardiac Enzymes  Recent Labs  07/11/13 2200 07/12/13 0255 07/13/13 0500  TROPONINI 5.35* 9.94* 5.40*   BNP No components found with this basename: POCBNP,  D-Dimer No results found for this basename: DDIMER,  in the last 72 hours Hemoglobin A1C No results found for this basename: HGBA1C,  in the last 72 hours Fasting Lipid Panel  Recent Labs  07/12/13 1847  TRIG 133   Thyroid Function Tests No results found for this basename: TSH, T4TOTAL, FREET3, T3FREE, THYROIDAB,  in the last 72 hours  TELE  NSR  ECG    Radiology/Studies  Dg Chest Port 1 View  07/14/2013   CLINICAL DATA:  Layering bilateral pleural effusions  EXAM: PORTABLE CHEST - 1 VIEW  COMPARISON:  07/13/2013 and previous  FINDINGS: Endotracheal tube has its tip 1.5 cm above the carina. Nasogastric tube enters the abdomen. Right internal jugular central line has its tip in the SVC above the right atrium. Mild interstitial and alveolar edema persists. Small amount of layering fluid is probably present,  grossly unchanged. No new finding.  IMPRESSION: Persistent mild residual edema and layering pleural effusions. Lines and tubes well positioned.   Electronically Signed   By: Paulina FusiMark  Shogry M.D.   On: 07/14/2013 08:04   Dg Chest Port 1 View  07/13/2013   CLINICAL DATA:  Check endotracheal tube position  EXAM: PORTABLE CHEST - 1 VIEW  COMPARISON:  07/12/2013  FINDINGS: Endotracheal tube ends in the mid to lower thoracic trachea, 3 cm above the carina. Orogastric tube enters the stomach. Unchanged right IJ catheter, tip at the lower SVC.  No cardiomegaly. Stable upper mediastinal contours. Unchanged haziness of the lower chest, consistent with layering pleural fluid. Pulmonary venous congestion. Pulmonary edema seen previously has resolved. No pneumothorax.  IMPRESSION: 1. Stable positioning of tubes and lines. 2. Layering bilateral pleural effusions.   Electronically Signed   By: Tiburcio PeaJonathan  Watts M.D.   On: 07/13/2013 06:58   Dg Chest Port 1 View  07/12/2013   CLINICAL DATA:  Edema.  Renal failure.  EXAM: PORTABLE CHEST - 1 VIEW  COMPARISON:  07/11/2013  FINDINGS: Endotracheal tube remains in place with tip approximately 3 cm above the carina. Right jugular central venous catheter remains in place with tip overlying the lower SVC. Enteric tube courses into the left upper abdomen, incompletely imaged. The cardiac silhouette remains mildly enlarged. Thoracic aortic calcification is noted. Diffuse bilateral pulmonary opacities appear slightly improved since the prior study. Small bilateral pleural effusions also may have decreased slightly in size. No pneumothorax is identified.  IMPRESSION: Mild interval improvement in pulmonary edema and small bilateral pleural effusions.   Electronically Signed   By: Sebastian AcheAllen  Grady   On: 07/12/2013 11:18   Dg Chest Port 1 View  07/11/2013   CLINICAL DATA:  Shortness of breath.  EXAM: PORTABLE CHEST - 1 VIEW  COMPARISON:  Portable film earlier in the day.  FINDINGS: ET tube 3.9 cm  above carina. Central venous catheter tip from right IJ approach lies at the SVC RA junction. The heart is enlarged. There is widespread bilateral pulmonary opacities consistent with pulmonary edema. Bilateral effusions. Worsening aeration compared with priors. No visible pneumothorax.  IMPRESSION: Worsening aeration.  Probable developing pulmonary edema.   Electronically Signed   By: Davonna BellingJohn  Curnes  M.D.   On: 07/11/2013 17:07   Dg Chest Port 1 View  07/11/2013   CLINICAL DATA:  Chest pain.  Endotracheal tube position  EXAM: PORTABLE CHEST - 1 VIEW  COMPARISON:  07/11/2013  FINDINGS: Endotracheal tube in good position.  NG tube has been removed.  Improvement in edema and bilateral effusions since earlier today. There remains mild edema and small effusions. Improvement in bibasilar atelectasis.  IMPRESSION: Endotracheal tube in good position. Improvement in pulmonary edema and bilateral effusions.   Electronically Signed   By: Marlan Palau M.D.   On: 07/11/2013 15:50   Dg Chest Port 1 View  07/11/2013   CLINICAL DATA:  Pneumonia  EXAM: PORTABLE CHEST - 1 VIEW  COMPARISON:  07/10/2013  FINDINGS: The endotracheal tube is noted in satisfactory position. The nasogastric catheter is seen within the stomach. The cardiac shadow is stable. Bilateral pleural effusions are again identified. Vascular congestion is again seen.  IMPRESSION: The overall appearance has worsened in the interval with increasing bilateral pleural effusions.   Electronically Signed   By: Alcide Clever M.D.   On: 07/11/2013 08:10   Dg Chest Port 1 View  07/10/2013   CLINICAL DATA:  Endotracheal tube position.  EXAM: PORTABLE CHEST - 1 VIEW  COMPARISON:  07/09/2013.  FINDINGS: Endotracheal tube tip 1.5 cm above the carina. Recommend retracting by 2.5 cm to avoid mainstem bronchus intubation with change in patient's neck position.  Diffuse airspace disease most notable mid-lower lung zones and greater on right. This may represent asymmetric pulmonary  edema although infectious infiltrate not excluded in the proper clinical setting. Slight worsening of airspace disease right lung base.  Difficult to adequately assess cardiac size secondary to airspace disease.  Calcified mildly tortuous aorta.  Nasogastric tube courses below the diaphragm. Tip is not included on the present exam.  No gross pneumothorax.  IMPRESSION: Endotracheal tube tip 1.5 cm above the carina. Recommend retracting by 2.5 cm to avoid mainstem bronchus intubation with change in patient's neck position.  Slight worsening of airspace disease right lung base. Please see above.  This is a call report.   Electronically Signed   By: Bridgett Larsson M.D.   On: 07/10/2013 07:24   Dg Chest Port 1 View  07/09/2013   CLINICAL DATA:  Hypoxia  EXAM: PORTABLE CHEST - 1 VIEW  COMPARISON:  None.  FINDINGS: Endotracheal tube tip is at the carina. No pneumothorax. There is cardiomegaly with mild interstitial edema. There is consolidation in both bases. There is pulmonary venous hypertension. No adenopathy.  IMPRESSION: Endotracheal tube tip is at the carina. Advise withdrawing endotracheal tube approximately 3 cm. There is underlying congestive heart failure. There is airspace consolidation in both bases. This opacity may represent alveolar edema. Superimposed pneumonia, however, cannot be excluded. Congestive heart failure and pneumonia may exist concurrently.   Electronically Signed   By: Bretta Bang M.D.   On: 07/09/2013 17:06   Dg Abd Portable 1v  07/09/2013   CLINICAL DATA:  NG tube placement  EXAM: PORTABLE ABDOMEN - 1 VIEW  COMPARISON:  None.  FINDINGS: Moderate gastric distention with gas. NG tube with tip in proximal stomach.  IMPRESSION: NG tube in place with tip in proximal stomach. Moderate gaseous distension of the stomach.   Electronically Signed   By: Natasha Mead M.D.   On: 07/09/2013 18:44    ASSESSMENT AND PLAN 1. NSTEMI 2. Successful PCI of LAD and RCA with DES on 07/13/13 3.ESRD 4.  Acute hypoxic respiratory  failure. Per CCM  Plan: Fluid removal via dialysis. Continue DAPT for DES. Statin.   Signed, Cassell Clement MD

## 2013-07-15 LAB — POCT I-STAT 3, ART BLOOD GAS (G3+)
Acid-Base Excess: 5 mmol/L — ABNORMAL HIGH (ref 0.0–2.0)
Bicarbonate: 26.7 mEq/L — ABNORMAL HIGH (ref 20.0–24.0)
O2 Saturation: 91 %
PCO2 ART: 29.2 mmHg — AB (ref 35.0–45.0)
PH ART: 7.569 — AB (ref 7.350–7.450)
PO2 ART: 50 mmHg — AB (ref 80.0–100.0)
Patient temperature: 98.6
TCO2: 28 mmol/L (ref 0–100)

## 2013-07-15 LAB — COMPREHENSIVE METABOLIC PANEL
ALT: 28 U/L (ref 0–53)
AST: 31 U/L (ref 0–37)
Albumin: 2.9 g/dL — ABNORMAL LOW (ref 3.5–5.2)
Alkaline Phosphatase: 110 U/L (ref 39–117)
BILIRUBIN TOTAL: 0.3 mg/dL (ref 0.3–1.2)
BUN: 43 mg/dL — ABNORMAL HIGH (ref 6–23)
CALCIUM: 7.9 mg/dL — AB (ref 8.4–10.5)
CHLORIDE: 95 meq/L — AB (ref 96–112)
CO2: 26 meq/L (ref 19–32)
CREATININE: 6.62 mg/dL — AB (ref 0.50–1.35)
GFR, EST AFRICAN AMERICAN: 8 mL/min — AB (ref >90)
GFR, EST NON AFRICAN AMERICAN: 7 mL/min — AB (ref >90)
GLUCOSE: 190 mg/dL — AB (ref 70–99)
Potassium: 3.6 mEq/L — ABNORMAL LOW (ref 3.7–5.3)
Sodium: 138 mEq/L (ref 137–147)
Total Protein: 6.5 g/dL (ref 6.0–8.3)

## 2013-07-15 LAB — GLUCOSE, CAPILLARY
GLUCOSE-CAPILLARY: 184 mg/dL — AB (ref 70–99)
GLUCOSE-CAPILLARY: 86 mg/dL (ref 70–99)
Glucose-Capillary: 115 mg/dL — ABNORMAL HIGH (ref 70–99)
Glucose-Capillary: 185 mg/dL — ABNORMAL HIGH (ref 70–99)
Glucose-Capillary: 189 mg/dL — ABNORMAL HIGH (ref 70–99)
Glucose-Capillary: 215 mg/dL — ABNORMAL HIGH (ref 70–99)
Glucose-Capillary: 248 mg/dL — ABNORMAL HIGH (ref 70–99)

## 2013-07-15 LAB — PHOSPHORUS: Phosphorus: 2.8 mg/dL (ref 2.3–4.6)

## 2013-07-15 LAB — CBC
HCT: 27.6 % — ABNORMAL LOW (ref 39.0–52.0)
HEMOGLOBIN: 9.1 g/dL — AB (ref 13.0–17.0)
MCH: 31.9 pg (ref 26.0–34.0)
MCHC: 33 g/dL (ref 30.0–36.0)
MCV: 96.8 fL (ref 78.0–100.0)
Platelets: 214 10*3/uL (ref 150–400)
RBC: 2.85 MIL/uL — AB (ref 4.22–5.81)
RDW: 15.1 % (ref 11.5–15.5)
WBC: 12.3 10*3/uL — AB (ref 4.0–10.5)

## 2013-07-15 LAB — MAGNESIUM: Magnesium: 2.2 mg/dL (ref 1.5–2.5)

## 2013-07-15 MED ORDER — MAGNESIUM HYDROXIDE 400 MG/5ML PO SUSP
30.0000 mL | Freq: Once | ORAL | Status: AC
  Administered 2013-07-15: 30 mL
  Filled 2013-07-15: qty 30

## 2013-07-15 NOTE — Progress Notes (Addendum)
RT note: Pt. lifting head off bed spontaneously, NIF-(-20)cmh20, FVC-(1L), (+) cuff leak, plan to attempt Incentive Spirometry asap, RT to monitor.

## 2013-07-15 NOTE — Progress Notes (Signed)
PT Cancellation Note  Patient Details Name: [name] MRN: [id number] DOB: 1933-10-07   Cancelled Treatment:    Reason Eval/Treat Not Completed: Medical issues which prohibited therapy (pt just extubated and if time allows will see in PM or next date)   [name] 07/15/2013, 9:51 AM [name], PT [phone]

## 2013-07-15 NOTE — Progress Notes (Signed)
PULMONARY / CRITICAL CARE MEDICINE   Name: [name] MRN: [id number] DOB: 23-Sep-1933    ADMISSION DATE:  07/09/2013 CONSULTATION DATE:  4/5 PRIMARY SERVICE: PCCM  CHIEF COMPLAINT:  SOB and abdominal pain  BRIEF PATIENT DESCRIPTION:   Patient is a 89 man with acute onset SOB with history of ESRD on HD (MWF), PVD (s/p left fempop) & DM on insulin. Admitted for abdominal pain and SOB. Per [name] review, patient has been without fever and cough.   He had a [facility], told it was not HF, and thought there was something wrong with his lungs. The  patient is constantly sleepy since Dec.  He has had similar SOB prior to Dec, thought to be due to heart failure. To note, patient lives alone. He has 5 children, but only speaks with 2, a daughter in [place]([name], [phone] - primary contact) and son in [place].     SIGNIFICANT EVENTS / STUDIES:   07/09/13- [facility]: B/l pulm edema (CD with image in chart) 4/5-CXR: underlying congestive heart failure, airspace consolidation in both bases.  4/6-CXR: worsening of airspace disease right lung base.  4/6- Did HD 4/7-CXR: No significant change of airspace disease  4/7-had chest pain and leleved trop-->started heparin gtt 4/7-reintubated, chest pain, pos trop, NSTEMI 4/8-R and L cardiac cath-->three vessel disease. Cardiovascular surgeon consulted-->no CABG due to multiple comorbidities.  4/9-PCI of mid LAD ~80-90-->DES; proximal RCA ~80% lesion-->DES. Elevated LVEDP of 22 mmHG  4/10 - HD, off pressors  LINES / TUBES: ETT 07/09/13 >>>4/7 AM(self extubated) Re-ETT 4/7>> 4/7 Rt IJ>>>   CULTURES: 07/09/13 Blood Cx x2 >>>ng 07/09/13 Trach Aspiriate >>> negative 07/09/13 Resp Virus PCR Panel >> negative  ANTIBIOTICS: Vanc 4/5 >>>4/6 Zosyn 4/5 >>>4/6  SUBJECTIVE:  Low gr fevers dialysed 4/10  VITAL SIGNS: Temp:  [98 F (36.7 C)-100.7 F (38.2 C)] 100.7 F (38.2 C) (04/11  0441) Pulse Rate:  [53-100] 81 (04/11 0700) Resp:  [13-21] 18 (04/11 0700) BP: (77-171)/(31-56) 165/42 mmHg (04/11 0700) SpO2:  [89 %-100 %] 100 % (04/11 0700) FiO2 (%):  [40 %] 40 % (04/11 0700) Weight:  [59 kg (130 lb 1.1 oz)-62.2 kg (137 lb 2 oz)] 60.4 kg (133 lb 2.5 oz) (04/11 0500) HEMODYNAMICS: CVP:  [1 mmHg-13 mmHg] 2 mmHg VENTILATOR SETTINGS: Vent Mode:  [-] PRVC FiO2 (%):  [40 %] 40 % Set Rate:  [16 bmp] 16 bmp Vt Set:  [500 mL] 500 mL PEEP:  [5 cmH20] 5 cmH20 Pressure Support:  [12 cmH20-14 cmH20] 12 cmH20 Plateau Pressure:  [16 cmH20-17 cmH20] 16 cmH20 INTAKE / OUTPUT: Intake/Output     04/10 0701 - 04/11 0700 04/11 0701 - 04/12 0700   I.V. (mL/kg) 442.1 (7.3)    NG/GT 690    IV Piggyback 110    Total Intake(mL/kg) 1242.1 (20.6)    Other 1675    Stool     Total Output 1675     Net -432.9            PHYSICAL EXAMINATION: General: intubated and sedated HEENT: PERRL (sluggish), EOMI, no scleral icterus, dentures in place Cardiac: RRR, no rubs, murmurs or gallops Pulm: faint diffuse wheeze Abd: soft, nontender, nondistended, BS normoactive Ext: warm and well perfused, no pedal edema, LUE dialysis access with +thrill Neuro: responsive to simple commands in spanish  LABS:  CBC  Recent Labs Lab 07/13/13 0500 07/14/13 0500 07/15/13 0500  WBC 11.0* 10.1 12.3*  HGB 10.8*  9.3* 9.1*  HCT 32.5* 27.2* 27.6*  PLT 209 200 214   Coag's  Recent Labs Lab 07/12/13 0030 07/13/13 0500  APTT 46*  --   INR 1.19 1.12   BMET  Recent Labs Lab 07/13/13 0500 07/14/13 0500 07/15/13 0500  NA 142 137 138  K 3.3* 3.5* 3.6*  CL 96 94* 95*  CO2 19 21 26   BUN 36* 52* 43*  CREATININE 5.63* 8.27* 6.62*  GLUCOSE 177* 144* 190*   Electrolytes  Recent Labs Lab 07/11/13 2200  07/13/13 0500 07/14/13 0500 07/14/13 0700 07/15/13 0500  CALCIUM  --   < > 8.1* 7.9*  --  7.9*  MG 2.1  --   --   --  2.3 2.2  PHOS 4.0  --   --   --  4.7* 2.8  < > = values in this  interval not displayed. Sepsis Markers  Recent Labs Lab 07/09/13 1900  LATICACIDVEN 1.8  PROCALCITON 4.39   ABG  Recent Labs Lab 07/09/13 1611 07/11/13 1727 07/12/13 1359  PHART 7.424 7.348* 7.521*  PCO2ART 41.1 49.7* 39.2  PO2ART 68.0* 83.0 444.0*   Liver Enzymes  Recent Labs Lab 07/11/13 1940 07/15/13 0500  AST 36 31  ALT 20 28  ALKPHOS 85 110  BILITOT 0.5 0.3  ALBUMIN 3.8 2.9*   Cardiac Enzymes  Recent Labs Lab 07/10/13 0525 07/10/13 0910  07/11/13 2200 07/12/13 0255 07/13/13 0500  TROPONINI <0.30  --   < > 5.35* 9.94* 5.40*  PROBNP  --  54473.0*  --   --   --  47075.0*  < > = values in this interval not displayed. Glucose  Recent Labs Lab 07/14/13 0800 07/14/13 1222 07/14/13 1537 07/14/13 1939 07/14/13 2334 07/15/13 0440  GLUCAP 171* 139* 183* 243* 215* 189*    Imaging Dg Chest Port 1 View  07/14/2013   CLINICAL DATA:  Layering bilateral pleural effusions  EXAM: PORTABLE CHEST - 1 VIEW  COMPARISON:  07/13/2013 and previous  FINDINGS: Endotracheal tube has its tip 1.5 cm above the carina. Nasogastric tube enters the abdomen. Right internal jugular central line has its tip in the SVC above the right atrium. Mild interstitial and alveolar edema persists. Small amount of layering fluid is probably present, grossly unchanged. No new finding.  IMPRESSION: Persistent mild residual edema and layering pleural effusions. Lines and tubes well positioned.   Electronically Signed   By: Paulina Fusi M.D.   On: 07/14/2013 08:04    ASSESSMENT / PLAN:  PULMONARY A: Acute Hypoxemic Respiratory failure, ischemia playing a part, and overload -Procalcitonin 4.39 -Improving  P:   -SBTattempt, cpap ps 5, goal extubate   CARDIOVASCULAR A: h/o HTN, PVD - NSTEM. R and L cath-->severe 3 vessel obstructive CAD. Per CTS, no CABG-->s/p PCA and stent placement in LAD and RCA -on ASA and plavix, Lipitor - Eevated LVEDP of 22 mmHG  P:   - contineu ASA and plavix,  Lipitor - start ACEI, and BB as bp tolerated - 2D echo pending - off pressor now-  ENAL A:  ESRD on HD, last 4/10 P:   -Renal following,  -cont Fosrenal per tube -kvo  GASTROINTESTINAL A: tolerating TF P:    -Famotidine - GI ppx -TF   HEMATOLOGIC A:  DVT prev, mi P:  -DVT ppx: Sq heparin -plavix Off hep drip  INFECTIOUS A:  Fever  P:    -off abx, observe,WC slight up  ENDOCRINE A:  DM   P:   -ICU  Hyperglycemia protocol  NEUROLOGIC A:  Acute Encephalopathy - uremia vs benzo (?lorazepam at [place]), not hypercarbic P:   -HD  -fent -WUA planned with SBT    Global: wean with goal extubation  I have personally obtained a history, examined the patient, evaluated laboratory and imaging results, formulated the assessment and plan and placed orders. CRITICAL CARE: The patient is critically ill with multiple organ systems failure and requires high complexity decision making for assessment and support, frequent evaluation and titration of therapies, application of advanced monitoring technologies and extensive interpretation of multiple databases. Critical Care Time devoted to patient care services described in this note is 30 minutes.   Cyril Mourning MD. Tonny Bollman. [facility] Pager [phone] If no response call [phone]    07/15/2013, 7:32 AM

## 2013-07-15 NOTE — Progress Notes (Signed)
[facility] Progress Note   Subjective: Extubated. BP's low side, CVP 1, HDyest with 1.6 kg off  Filed Vitals:   07/15/13 0700 07/15/13 0800 07/15/13 0900 07/15/13 0926  BP: 165/42 169/44 154/119   Pulse: 81 98 100 103  Temp:      TempSrc:      Resp: 18 19 21 24   Height:      Weight:      SpO2: 100% 100% 100% 99%   Exam: Off vent, no distress No jvd Chest clear bilat RRR 2/6 SEM no RG  Abd soft, NTND  No edema LUA AVF patent +bruit  ECHO - no old echo on chart / LVEF 65% from heart cath on 4/8  Dialysis: MWF [place]  3h 15min   67kg  2/2.5 Bath P4 LUA AVF Heparin 2000  Hectorol none Epo 6200 Venofer 100 /wk on Wed  Assessment:  1 Resp failure- extubated 2 3VCAD s/p PCI to LAD / RCA 3 ESRD 4 HTN/volume- intravascular volume is low, CVP 1, dry wt down 6kg 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9*  PHOS 4.0  --   --   --  4.7* 2.8  < > = values in this interval not displayed.  Recent Labs Lab 07/11/13 1940 07/15/13 0500  AST 36 31  ALT 20 28  ALKPHOS 85 110  BILITOT 0.5 0.3  PROT 7.9 6.5  ALBUMIN 3.8 2.9*    Recent Labs Lab 07/13/13 0500 07/14/13 0500 07/15/13 0500  WBC 11.0* 10.1 12.3*  HGB 10.8* 9.3* 9.1*  HCT 32.5* 27.2* 27.6*  MCV 96.7 94.4 96.8  PLT 209 200 214   . antiseptic oral rinse  15 mL Mouth Rinse QID  . aspirin  81 mg Per Tube Daily  . atorvastatin  40 mg Oral q1800   . chlorhexidine  15 mL Mouth Rinse BID  . clopidogrel  75 mg Oral Q breakfast  . darbepoetin (ARANESP) injection - DIALYSIS  40 mcg Intravenous Q Mon-HD  . famotidine  20 mg Per Tube Daily  . ferric gluconate (FERRLECIT/NULECIT) IV  125 mg Intravenous Q M,W,F-HD  . heparin  2,000 Units Intracatheter Once  . heparin  5,000 Units Subcutaneous 3 times per day  . insulin aspart  2-6 Units Subcutaneous 6 times per day   . sodium chloride 10 mL/hr at 07/09/13 2115  . sodium chloride     sodium chloride, sodium chloride, dextrose, heparin, lidocaine (PF), lidocaine-prilocaine, morphine injection, pentafluoroprop-tetrafluoroeth

## 2013-07-15 NOTE — Progress Notes (Signed)
Patient Name: [name]      SUBJECTIVE: 80 year old man with a history of diabetes peripheral vascular disease hypertension and end-stage renal disease admitted to [facility]  With  pulmonary edema.  Troponins were negative and following self extubation, he complained of chest pain or respiratory distress is if we had a non-STEMI. He was reintubated. Catheterization demonstrated normal left ventricular function and severe 3 vessel disease. It is not felt to be a candidate for bypass surgery and was subsequently submitted for PCI of the LAD/RCA  while still intubated. His LVEDP was 22  He underwent DES stenting 4/9 He was extubated this morning. He is not responsive to questions  Past Medical History  Diagnosis Date  . Diabetes mellitus   . Hypertension   . Hyperlipidemia   . Glaucoma   . Cataract   . Peripheral vascular disease   . ESRD on dialysis     Scheduled Meds:  Scheduled Meds: . antiseptic oral rinse  15 mL Mouth Rinse QID  . aspirin  81 mg Per Tube Daily  . atorvastatin  40 mg Oral q1800  . chlorhexidine  15 mL Mouth Rinse BID  . clopidogrel  75 mg Oral Q breakfast  . darbepoetin (ARANESP) injection - DIALYSIS  40 mcg Intravenous Q Mon-HD  . famotidine  20 mg Per Tube Daily  . ferric gluconate (FERRLECIT/NULECIT) IV  125 mg Intravenous Q M,W,F-HD  . heparin  2,000 Units Intracatheter Once  . heparin  5,000 Units Subcutaneous 3 times per day  . insulin aspart  2-6 Units Subcutaneous 6 times per day   Continuous Infusions: . sodium chloride 10 mL/hr at 07/09/13 2115  . sodium chloride      PHYSICAL EXAM Filed Vitals:   07/15/13 1000 07/15/13 1100 07/15/13 1146 07/15/13 1200  BP: 153/129 163/37  157/43  Pulse: 100 94  94  Temp:   99.6 F (37.6 C)   TempSrc:   Oral   Resp: 21 25  18   Height:      Weight:      SpO2: 94% 100%  100%    Lying in the fetal position and unresponsive although moving Good air movement Regular rate and  rhythm Extremities without edema Skin warm and dry Rectal tube in place  TELEMETRY: Reviewed telemetry pt in nsr    Intake/Output Summary (Last 24 hours) at 07/15/13 1221 Last data filed at 07/15/13 1100  Gross per 24 hour  Intake    970 ml  Output   1675 ml  Net   -705 ml    LABS: Basic Metabolic Panel:  Recent Labs Lab 07/10/13 0525  07/11/13 0246  07/12/13 0255 07/13/13 0500 07/14/13 0500 07/14/13 0700 07/15/13 0500  NA 143  --  135*  --  141 142 137  --  138  K 5.3  --  5.3  --  4.1 3.3* 3.5*  --  3.6*  CL 99  --  89*  --  97 96 94*  --  95*  CO2 24  --  27  --  24 19 21   --  26  GLUCOSE 44*  --  138*  --  122* 177* 144*  --  190*  BUN 63*  --  32*  --  25* 36* 52*  --  43*  CREATININE 10.32*  --  6.18*  --  5.16* 5.63* 8.27*  --  6.62*  CALCIUM 8.4  --  8.2*  --  8.3* 8.1* 7.9*  --  7.9*  MG 2.4  < >  --   < >  --   --   --  2.3 2.2  PHOS 2.7  < >  --   < >  --   --   --  4.7* 2.8  < > = values in this interval not displayed. Cardiac Enzymes:  Recent Labs  07/13/13 0500  TROPONINI 5.40*   CBC:  Recent Labs Lab 07/10/13 0500 07/11/13 0246 07/13/13 0500 07/14/13 0500 07/15/13 0500  WBC 12.8* 12.2* 11.0* 10.1 12.3*  HGB 9.2* 10.6* 10.8* 9.3* 9.1*  HCT 28.1* 31.6* 32.5* 27.2* 27.6*  MCV 97.9 97.2 96.7 94.4 96.8  PLT 221 PLATELET CLUMPS NOTED ON SMEAR, COUNT APPEARS ADEQUATE 209 200 214   PROTIME:  Recent Labs  07/13/13 0500  LABPROT 14.2  INR 1.12   Liver Function Tests:  Recent Labs  07/15/13 0500  AST 31  ALT 28  ALKPHOS 110  BILITOT 0.3  PROT 6.5  ALBUMIN 2.9*   No results found for this basename: LIPASE, AMYLASE,  in the last 72 hours BNP: BNP (last 3 results)  Recent Labs  07/10/13 0910 07/13/13 0500  PROBNP 54473.0* 47075.0*   D-Dimer: No results found for this basename: DDIMER,  in the last 72 hours Hemoglobin A1C: No results found for this basename: HGBA1C,  in the last 72 hours Fasting Lipid Panel:  Recent  Labs  07/12/13 1847  TRIG 133      ASSESSMENT AND PLAN:  1. Non-STEMI/normal LV function  2. PCI to LAD/RCA  3. Respiratory failure-extubated  4. End-stage renal disease on dialysis  5.  Encephalopathy  Continue aspirin and Plavix. Begin ACE inhibitors next 24 hours and possibly beta blockers if he is taking by mouth. Early hazard benefit is probably the most important therapy  Signed, [facility] Emmons Toth MD  07/15/2013

## 2013-07-15 NOTE — Procedures (Signed)
Extubation Procedure Note  Patient Details:   Name: Titus Mouldntonio Archambeau DOB: [birth year] MRN: [id number]   Airway Documentation:   + air leak around cuff prior to extubation.  Evaluation  O2 sats: stable throughout Complications: No apparent complications Patient did tolerate procedure well. Bilateral Breath Sounds: Clear;Diminished Suctioning: Oral;Airway Yes, pt able to speak.  NO distress noted, no stridor noted.    Regan RakersSarah J Annely Sliva 07/15/2013, 9:27 AM

## 2013-07-15 NOTE — Progress Notes (Signed)
SLP Cancellation Note  Patient Details Name: [name] MRN: [id number] DOB: 09-12-1933   Cancelled treatment: Received order for BSE.  Evaluation deferred to 07/16/13.     Moreen FowlerKaren Emeri Estill MS, CCC-SLP [phone] Lacinda AxonKaren H Salimah Martinovich 07/15/2013, 4:09 PM

## 2013-07-16 ENCOUNTER — Inpatient Hospital Stay (HOSPITAL_COMMUNITY): Payer: Medicare Other

## 2013-07-16 LAB — BASIC METABOLIC PANEL
BUN: 62 mg/dL — AB (ref 6–23)
CALCIUM: 8.2 mg/dL — AB (ref 8.4–10.5)
CO2: 20 mEq/L (ref 19–32)
CREATININE: 9.24 mg/dL — AB (ref 0.50–1.35)
Chloride: 94 mEq/L — ABNORMAL LOW (ref 96–112)
GFR calc Af Amer: 6 mL/min — ABNORMAL LOW (ref >90)
GFR, EST NON AFRICAN AMERICAN: 5 mL/min — AB (ref >90)
Glucose, Bld: 173 mg/dL — ABNORMAL HIGH (ref 70–99)
Potassium: 3.7 mEq/L (ref 3.7–5.3)
Sodium: 139 mEq/L (ref 137–147)

## 2013-07-16 LAB — CULTURE, BLOOD (ROUTINE X 2)
CULTURE: NO GROWTH
Culture: NO GROWTH

## 2013-07-16 LAB — GLUCOSE, CAPILLARY
GLUCOSE-CAPILLARY: 174 mg/dL — AB (ref 70–99)
GLUCOSE-CAPILLARY: 246 mg/dL — AB (ref 70–99)
Glucose-Capillary: 106 mg/dL — ABNORMAL HIGH (ref 70–99)
Glucose-Capillary: 146 mg/dL — ABNORMAL HIGH (ref 70–99)
Glucose-Capillary: 161 mg/dL — ABNORMAL HIGH (ref 70–99)
Glucose-Capillary: 167 mg/dL — ABNORMAL HIGH (ref 70–99)
Glucose-Capillary: 229 mg/dL — ABNORMAL HIGH (ref 70–99)

## 2013-07-16 LAB — CBC
HCT: 28.1 % — ABNORMAL LOW (ref 39.0–52.0)
Hemoglobin: 9.4 g/dL — ABNORMAL LOW (ref 13.0–17.0)
MCH: 32.2 pg (ref 26.0–34.0)
MCHC: 33.5 g/dL (ref 30.0–36.0)
MCV: 96.2 fL (ref 78.0–100.0)
PLATELETS: 239 10*3/uL (ref 150–400)
RBC: 2.92 MIL/uL — ABNORMAL LOW (ref 4.22–5.81)
RDW: 15.1 % (ref 11.5–15.5)
WBC: 14 10*3/uL — ABNORMAL HIGH (ref 4.0–10.5)

## 2013-07-16 MED ORDER — HALOPERIDOL LACTATE 5 MG/ML IJ SOLN
5.0000 mg | Freq: Once | INTRAMUSCULAR | Status: AC
  Administered 2013-07-16: 5 mg via INTRAVENOUS

## 2013-07-16 MED ORDER — HALOPERIDOL LACTATE 5 MG/ML IJ SOLN
INTRAMUSCULAR | Status: AC
  Administered 2013-07-16: 5 mg via INTRAVENOUS
  Filled 2013-07-16: qty 1

## 2013-07-16 MED ORDER — INSULIN ASPART 100 UNIT/ML ~~LOC~~ SOLN
0.0000 [IU] | SUBCUTANEOUS | Status: DC
  Administered 2013-07-16: 20:00:00 via SUBCUTANEOUS
  Administered 2013-07-17 (×2): 3 [IU] via SUBCUTANEOUS
  Administered 2013-07-17: 1 [IU] via SUBCUTANEOUS
  Administered 2013-07-17: 3 [IU] via SUBCUTANEOUS
  Administered 2013-07-18: 2 [IU] via SUBCUTANEOUS
  Administered 2013-07-18: 1 [IU] via SUBCUTANEOUS
  Administered 2013-07-18 (×2): 2 [IU] via SUBCUTANEOUS
  Administered 2013-07-18: 1 [IU] via SUBCUTANEOUS
  Administered 2013-07-19: 2 [IU] via SUBCUTANEOUS
  Administered 2013-07-19: 12:00:00 via SUBCUTANEOUS
  Administered 2013-07-19: 3 [IU] via SUBCUTANEOUS
  Administered 2013-07-19: 18:00:00 via SUBCUTANEOUS
  Administered 2013-07-19 – 2013-07-20 (×2): 2 [IU] via SUBCUTANEOUS
  Administered 2013-07-20: 1 [IU] via SUBCUTANEOUS
  Administered 2013-07-20: 2 [IU] via SUBCUTANEOUS

## 2013-07-16 MED ORDER — INSULIN ASPART 100 UNIT/ML ~~LOC~~ SOLN: 2.0000 [IU] | SUBCUTANEOUS | Status: DC

## 2013-07-16 NOTE — Progress Notes (Signed)
[facility] Progress Note   Subjective: no distress, lying flat, SaO2 100% on [place], denies SOB or pain  Filed Vitals:   07/16/13 0454 07/16/13 0500 07/16/13 0600 07/16/13 0840  BP:  160/42 108/63   Pulse:  103    Temp:    99 F (37.2 C)  TempSrc:    Oral  Resp:  18 20   Height:      Weight: 63 kg (138 lb 14.2 oz)     SpO2:  99% 98%    Exam: Lying flat No jvd Chest rales L base RRR 2/6 SEM no RG  Abd soft, NTND  No edema LUA AVF patent +bruit  ECHO - no old echo on chart / LVEF 65% from heart cath on 4/8 CXR 4/12 no chg mild edema pattern  Dialysis: MWF   3h 15min   67kg  2/2.5 Bath P4 LUA AVF Heparin 2000  Hectorol none Epo 6200 Venofer 100 /wk on Wed  Assessment:  1 Resp failure- off vent 2 3VCAD s/p PCI to LAD / RCA 3 ESRD 4 HTN/volume- pulm edema improved, then CXR today looks worse but pt stable 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  6 Anemia cont esa w darbe 40/wk  7 PAD hx L fempop  7 DM on insulin  Plan- HD Monday, UF to around 60kg as Caryl [place]    Rob Zebedee Segundo MD  pager [phone]    cell [phone]  07/16/2013, 9:59 AM     Recent Labs Lab 07/11/13 2200  07/14/13 0500 07/14/13 0700 07/15/13 0500 07/16/13 0400  NA  --   < > 137  --  138 139  K  --   < > 3.5*  --  3.6* 3.7  CL  --   < > 94*  --  95* 94*  CO2  --   < > 21  --  26 20  GLUCOSE  --   < > 144*  --  190* 173*  BUN  --   < > 52*  --  43* 62*  CREATININE  --   < > 8.27*  --  6.62* 9.24*  CALCIUM  --   < > 7.9*  --  7.9* 8.2*  PHOS 4.0  --   --  4.7* 2.8  --   < > = values in this interval not displayed.  Recent Labs Lab 07/11/13 1940 07/15/13 0500  AST 36 31  ALT 20 28  ALKPHOS 85 110  BILITOT 0.5 0.3  PROT 7.9 6.5  ALBUMIN 3.8 2.9*    Recent Labs Lab 07/14/13 0500 07/15/13 0500 07/16/13 0400  WBC 10.1 12.3* 14.0*  HGB 9.3* 9.1* 9.4*  HCT 27.2* 27.6* 28.1*  MCV 94.4 96.8 96.2  PLT 200 214 239   . antiseptic oral rinse  15 mL Mouth Rinse QID  . aspirin  81 mg  Per Tube Daily  . atorvastatin  40 mg Oral q1800  . chlorhexidine  15 mL Mouth Rinse BID  . clopidogrel  75 mg Oral Q breakfast  . darbepoetin (ARANESP) injection - DIALYSIS  40 mcg Intravenous Q Mon-HD  . famotidine  20 mg Per Tube Daily  . ferric gluconate (FERRLECIT/NULECIT) IV  125 mg Intravenous Q M,W,F-HD  . heparin  2,000 Units Intracatheter Once  . heparin  5,000 Units Subcutaneous 3 times per day  . insulin aspart  2-6 Units Subcutaneous 6 times per day   . sodium chloride Stopped (07/16/13 0700)  . sodium chloride 1,000 mL (  07/16/13 0021)   sodium chloride, sodium chloride, dextrose, heparin, lidocaine (PF), lidocaine-prilocaine, morphine injection, pentafluoroprop-tetrafluoroeth

## 2013-07-16 NOTE — Progress Notes (Signed)
Pt transferred from 34M. [facility] from [facility]. Tx10 placed on pt. SR 83 on the monitor. Hortencia ConradiWendi Verenis [place], RN

## 2013-07-16 NOTE — Evaluation (Signed)
Physical Therapy Evaluation Patient Details Name: [name] MRN: [id number] DOB: 15-Apr-1933 Today's Date: 07/16/2013   History of Present Illness  80 year old man with a history of diabetes peripheral vascular disease hypertension and end-stage renal disease [facility]  With  pulmonary edema.  Troponins were negative and following self extubation, he complained of chest pain or respiratory distress is if we had a non-STEMI.  Pt extubated 4/11.   Clinical Impression  Pt adm due to the above. Presents with decreased functional mobility secondary to deficits indicated below. Phone interpreter utilized during session and pt presented with cognitive deficits, decreased safety awareness and decreased ability to follow commands during session. No family present to determine support system. Pt reported to be independent prior to admission and living with wife and daughter. Will recommend CIR at this time to maximize independence with mobility and reduce caregiver burden at next venue. Pt to benefit from acute PT at this time to maximize mobility prior to D/C to next appropriate venue.    Follow Up Recommendations CIR    Equipment Recommendations  Other (comment) (TBD)    Recommendations for Other Services Rehab consult;OT consult     Precautions / Restrictions Precautions Precautions: Fall Precaution Comments: used phone interpreter  Restrictions Weight Bearing Restrictions: No      Mobility  Bed Mobility Overal bed mobility: Needs Assistance Bed Mobility: Supine to Sit;Sit to Supine     Supine to sit: Min assist;HOB elevated Sit to supine: Min assist   General bed mobility comments: min (A) to elevate trunk to sitting position with HOB elevated; cues for safety; min (A) to advance LEs onto bed and in supine position   Transfers Overall transfer level: Needs assistance Equipment used: 2 person hand held assist Transfers: Sit to/from Stand Sit to Stand: Mod  assist         General transfer comment: (A) to maintain balance and acheive upright standing position; pt very impulsive and tolerated standing <30 seconds and flopped back into sitting position on EOB with decreased awareness and decreased ability to control descent   Ambulation/Gait             General Gait Details: unable to test today; pt fatigued with transfer and returned to supine position   Stairs            Wheelchair Mobility    Modified Rankin (Stroke Patients Only)       Balance Overall balance assessment: Needs assistance Sitting-balance support: Feet supported;Single extremity supported Sitting balance-Leahy Scale: Poor Sitting balance - Comments: Rt UE supported on handrail and unable to accept challenges  Postural control: Posterior lean Standing balance support: During functional activity;Bilateral upper extremity supported Standing balance-Leahy Scale: Poor Standing balance comment: pt unsteady with standing; tolerated <30 seconds; + sway in all directions with LEs bracing on bed                             Pertinent Vitals/Pain Denies any pain at this time.     Home Living Family/patient expects to be discharged to:: Private residence Living Arrangements: Spouse/significant other;Children Available Help at Discharge: Family;Other (Comment) (unsure if family can provide 24/7 (A) )             Additional Comments: pt is poor historian with interpreter; no family present     Prior Function Level of Independence: Independent         Comments: pt reported he was  independent prior to admission;  unsure if pt is accurate      Hand Dominance        Extremity/Trunk Assessment   Upper Extremity Assessment: Defer to OT evaluation           Lower Extremity Assessment: Overall WFL for tasks assessed      Cervical / Trunk Assessment: Normal  Communication   Communication: Prefers language other than English   Cognition Arousal/Alertness: Awake/alert Behavior During Therapy: Impulsive Overall Cognitive Status: Difficult to assess Area of Impairment: Orientation;Following commands;Safety/judgement;Problem solving;Awareness Orientation Level: Disoriented to;Situation;Place;Time     Following Commands: Follows one step commands inconsistently Safety/Judgement: Decreased awareness of deficits;Decreased awareness of safety   Problem Solving: Slow processing;Difficulty sequencing;Requires verbal cues;Requires tactile cues General Comments: difficulty following multimodal commands during session     General Comments      Exercises General Exercises - Lower Extremity Ankle Circles/Pumps: AAROM;Both;10 reps;Seated (pt with difficulty following multimodal commands ) Long Arc Quad: AAROM;Both;5 reps;Seated      Assessment/Plan    PT Assessment Patient needs continued PT services  PT Diagnosis Difficulty walking;Altered mental status   PT Problem List Decreased activity tolerance;Decreased balance;Decreased mobility;Decreased cognition;Decreased knowledge of use of DME;Decreased safety awareness;Decreased knowledge of precautions  PT Treatment Interventions DME instruction;Gait training;Functional mobility training;Therapeutic activities;Therapeutic exercise;Balance training;Neuromuscular re-education;Patient/family education   PT Goals (Current goals can be found in the Care Plan section) Acute Rehab PT Goals Patient Stated Goal: none stated PT Goal Formulation: Patient unable to participate in goal setting Time For Goal Achievement: 07/30/13 Potential to Achieve Goals: Good    Frequency Min 3X/week   Barriers to discharge   unsure of D/C plans     Co-evaluation               End of Session Equipment Utilized During Treatment: Gait belt Activity Tolerance: Patient limited by fatigue Patient left: in bed;with restraints reapplied;with call bell/phone within reach;with bed alarm  set Nurse Communication: Mobility status;Precautions         Time: 1610-9604 PT Time Calculation (min): 23 min   Charges:   PT Evaluation $Initial PT Evaluation Tier I: 1 Procedure PT Treatments $Therapeutic Activity: 8-22 mins   PT G CodesNadara Mustard [place], [place] [phone] 07/16/2013, 9:16 AM

## 2013-07-16 NOTE — Evaluation (Signed)
Clinical/Bedside Swallow Evaluation Patient Details  Name: [name] MRN: [id number] Date of Birth: Jan 13, 1934  Today's Date: 07/16/2013 Time: [id number] SLP Time Calculation (min): 15 min  Past Medical History:  Past Medical History  Diagnosis Date  . Diabetes mellitus   . Hypertension   . Hyperlipidemia   . Glaucoma   . Cataract   . Peripheral vascular disease   . ESRD on dialysis    Past Surgical History:  Past Surgical History  Procedure Laterality Date  . Pr vein bypass graft,aorto-fem-pop  02/20/2010    left fem pop with saphenous vein  . Av fistula placement  10/24/2009    left   HPI:  The patient is a 80 y.o. year-old with hx of HTN, DM, PAD (s/p left fem-pop) and ESRD on HD starting since around March 2010. [facility] with SOB and abd pain. CXR showed diffuse bilat pulm edema. Pt intubated and [facility] to [facility]. Initial Troponins were negative; BNP 54473. He has been undergoing dialysis on 4/6,7 and today. Patient self extubated then later developed marked respiratory distress, complained of chest pain and required re-intubation. Extubated 07/15/13.  BSE ordered to assess risk for aspiration due following recent extubation.  Patient's daughter present during evaluation and voiced concern on patient not on fluid restriction or diabetic PO diet.     Assessment / Plan / Recommendation Clinical Impression  BSE completed.  No outward clinical s/s of aspiration with thin liquid trials.  Vocal quality remained clear throughout evaluation.  Oral holding noted with puree consistency with patient attempting to talk during oral prep stage.  Required tactile cues to initiate swallow with immediate coughing s/p swallow of puree.  Recommend to continue current diet consistency of clear liquid with full supervision with all meals due to noted cognitive deficits.  Recommend aspiration precautions and only administer PO's when patient fully alert.  RN made  aware of family's concerns.  Recommend continued Skilled ST in acute care setting for diet advancement.  Completion of objective evaluation to be determined.      Aspiration Risk  Severe    Diet Recommendation Thin liquid   Liquid Administration via: Cup;Straw Medication Administration: Whole meds with liquid Supervision: Full supervision/cueing for compensatory strategies Compensations: Slow rate;Multiple dry swallows after each bite/sip Postural Changes and/or Swallow Maneuvers: Seated upright 90 degrees;Upright 30-60 min after meal    Other  Recommendations Oral Care Recommendations: Oral care Q4 per protocol Other Recommendations: Clarify dietary restrictions   Follow Up Recommendations   (TBD )    Frequency and Duration min 2x/week  2 weeks       SLP Swallow Goals Please refer to Care Plan for listed goals   Swallow Study Prior Functional Status   No prior history of dysphagia     General Date of Onset: 07/09/13 HPI: 80 year old man with a history of diabetes peripheral vascular disease hypertension and end-stage renal disease [facility]  With  pulmonary edema.  Intubated 4/5 to 4/11 Type of Study: Bedside swallow evaluation Diet Prior to this Study: Thin liquids Temperature Spikes Noted: No Respiratory Status: Room air History of Recent Intubation: No Behavior/Cognition: Cooperative;Confused;Requires cueing;Lethargic;Impulsive;Decreased sustained attention;Hard of hearing Oral Cavity - Dentition: Edentulous Self-Feeding Abilities: Total assist Patient Positioning: Upright in bed Baseline Vocal Quality: Clear Volitional Cough: Weak Volitional Swallow: Unable to elicit    Oral/Motor/Sensory Function Overall Oral Motor/Sensory Function: Appears within functional limits for tasks assessed   Ice Chips Ice chips: Not tested   Thin  Liquid Thin Liquid: Not tested Presentation: Cup;Spoon;Straw    Nectar Thick Nectar Thick Liquid: Not tested   Honey  Thick Honey Thick Liquid: Not tested   Puree Puree: Impaired Oral Phase Functional Implications: Prolonged oral transit;Oral holding Pharyngeal Phase Impairments: Suspected delayed Swallow;Cough - Immediate   Solid   GO    Solid: Not tested      Moreen Fowler MS, CCC-SLP [phone] [name] 07/16/2013,4:45 PM

## 2013-07-16 NOTE — Progress Notes (Signed)
PULMONARY / CRITICAL CARE MEDICINE   Name: [name] MRN: [id number] DOB: Jul 22, 1933    ADMISSION DATE:  07/09/2013 CONSULTATION DATE:  07/16/13  CONSULTATION DATE: 4/5  PRIMARY SERVICE: PCCM  BRIEF PATIENT DESCRIPTION:  Patient is a [age] man with acute onset SOB with history of ESRD on HD (MWF), PVD (s/p left fempop) & DM on insulin. Admitted for abdominal pain and SOB. Per [name] review, patient has been without fever and cough.  He had a similar episode of [facility]B in December at [facility], told it was not HF, and thought there was something wrong with his lungs. The patient is constantly sleepy since Dec. He has had similar SOB prior to Dec, thought to be due to heart failure. To note, patient lives alone. He has 5 children, but only speaks with 2, a daughter in [place]([name], [phone] - primary contact) and son in [place].   SIGNIFICANT EVENTS / STUDIES:  07/09/13- [facility]R [facility]: B/l pulm edema (CD with image in chart)  4/5-CXR: underlying congestive heart failure, airspace consolidation in both bases.  4/6-CXR: worsening of airspace disease right lung base.  4/6- Did HD  4/7-CXR: No significant change of airspace disease  4/7-had chest pain and leleved trop-->started heparin gtt  4/7-reintubated, chest pain, pos trop, NSTEMI  4/8-R and L cardiac cath-->three vessel disease. Cardiovascular surgeon consulted-->no CABG due to multiple comorbidities.  4/9-PCI of mid LAD ~80-90-->DES; proximal RCA ~80% lesion-->DES. Elevated LVEDP of 22 mmHG  4/10 - HD, off pressors 4/10-    LINES / TUBES: ETT 07/09/13 >>>4/7 AM(self extubated)  Re-ETT 4/7>> 4/11  4/7 Rt IJ>>>07/16/13 ( self removed)  CULTURES:  07/09/13 Blood Cx x2 >>>ng  07/09/13 Trach Aspiriate >>> negative  07/09/13 Resp Virus PCR Panel >> negative   ANTIBIOTICS:  Vanc 4/5 >>>4/6  Zosyn 4/5 >>>4/6   SUBJECTIVE:  Good oxygenation on RA. Mild agitated. Spanish speaking.   Dialysed 4/10   VITAL  SIGNS: Temp:  [98.6 F (37 C)-99.6 F (37.6 C)] 98.6 F (37 C) (04/12 0400) Pulse Rate:  [82-115] 103 (04/12 0500) Resp:  [17-28] 20 (04/12 0600) BP: (108-163)/(30-129) 108/63 mmHg (04/12 0600) SpO2:  [93 %-100 %] 98 % (04/12 0600) Weight:  [138 lb 14.2 oz (63 kg)] 138 lb 14.2 oz (63 kg) (04/12 0454) HEMODYNAMICS: CVP:  [1 mmHg-32 mmHg] 6 mmHg VENTILATOR SETTINGS:   INTAKE / OUTPUT: Intake/Output     04/11 0701 - 04/12 0700 04/12 0701 - 04/13 0700   I.V. (mL/kg) 220 (3.5)    NG/GT 0    IV Piggyback     Total Intake(mL/kg) 220 (3.5)    Other     Stool 5    Total Output 5     Net +215            PHYSICAL EXAMINATION: General: Alert and awake.  HEENT: PERRL (sluggish), EOMI, no scleral icterus, dentures in place  Cardiac: RRR,no R/M/G Pulm: faint diffuse wheeze  Abd: soft, NTNDBS normoactive  Ext: warm and well perfused, no pedal edema, LUE dialysis access with +thrill  Neuro: responsive to simple commands in spanish  LABS:  CBC  Recent Labs Lab 07/14/13 0500 07/15/13 0500 07/16/13 0400  WBC 10.1 12.3* 14.0*  HGB 9.3* 9.1* 9.4*  HCT 27.2* 27.6* 28.1*  PLT 200 214 239   Coag's  Recent Labs Lab 07/12/13 0030 07/13/13 0500  APTT 46*  --   INR 1.19 1.12   BMET  Recent Labs Lab 07/14/13 0500 07/15/13 0500  07/16/13 0400  NA 137 138 139  K 3.5* 3.6* 3.7  CL 94* 95* 94*  CO2 21 26 20   BUN 52* 43* 62*  CREATININE 8.27* 6.62* 9.24*  GLUCOSE 144* 190* 173*   Electrolytes  Recent Labs Lab 07/11/13 2200  07/14/13 0500 07/14/13 0700 07/15/13 0500 07/16/13 0400  CALCIUM  --   < > 7.9*  --  7.9* 8.2*  MG 2.1  --   --  2.3 2.2  --   PHOS 4.0  --   --  4.7* 2.8  --   < > = values in this interval not displayed. Sepsis Markers  Recent Labs Lab 07/09/13 1900  LATICACIDVEN 1.8  PROCALCITON 4.39   ABG  Recent Labs Lab 07/11/13 1727 07/12/13 1359 07/15/13 0844  PHART 7.348* 7.521* 7.569*  PCO2ART 49.7* 39.2 29.2*  PO2ART 83.0 444.0* 50.0*    Liver Enzymes  Recent Labs Lab 07/11/13 1940 07/15/13 0500  AST 36 31  ALT 20 28  ALKPHOS 85 110  BILITOT 0.5 0.3  ALBUMIN 3.8 2.9*   Cardiac Enzymes  Recent Labs Lab 07/10/13 0525 07/10/13 0910  07/11/13 2200 07/12/13 0255 07/13/13 0500  TROPONINI <0.30  --   < > 5.35* 9.94* 5.40*  PROBNP  --  54473.0*  --   --   --  47075.0*  < > = values in this interval not displayed. Glucose  Recent Labs Lab 07/15/13 1109 07/15/13 1508 07/15/13 1511 07/15/13 1918 07/15/13 2343 07/16/13 0350  GLUCAP 115* 248* 185* 86 146* 161*    Imaging No results found.  PULMONARY  A: Acute Hypoxemic Respiratory failure, ischemia playing a part, and overload  -resolved  P:  -  extubated - good oxygenation on RA    CARDIOVASCULAR  A: h/o HTN, PVD  - NSTEM. R and L cath-->severe 3 vessel obstructive CAD. Per CTS, no CABG-->s/p PCA and stent placement in LAD and RCA  - Elevated LVEDP of 22 mmHG   P: - off pressors -on ASA and plavix, Lipitor  - start ACEI, and BB as bp tolerated per card ( next 24 hours)  -Await echo -EF nml by cath   ENAL  A: ESRD on HD, last 4/10  P:  -- HD per renal ( last 07/14/13) -cont Fosrenal per tube  -kvo   GASTROINTESTINAL  A: tolerating TF  P:  -Famotidine - GI ppx -consider to have Speech eval and resume po.   HEMATOLOGIC  A: DVT prev, mi  P:  -DVT ppx: Sq heparin  -plavix  Off hep drip   INFECTIOUS  A: Fever 100.7 at 0400 am on 07/15/13. Afebrile since     leukocytosis  Recent Labs Lab 07/14/13 0500 07/15/13 0500 07/16/13 0400  HGB 9.3* 9.1* 9.4*  HCT 27.2* 27.6* 28.1*  WBC 10.1 12.3* 14.0*  PLT 200 214 239    P:  -off abx, observe - F/U CBC - recs repeat CXR after HD tomorrow  - low threshold to Abx if fever or WC continues to rise  ENDOCRINE  A: DM  P:  -SSI  NEUROLOGIC  A: Acute Encephalopathy - uremia vs benzo (?lorazepam at [place]), not hypercarbic--resolving  P: - alert and awake, much better  today - remove restraints.  - Spanish Neurosurgeon or family to bedside - HD - MWF  Disposition Tr to Tele 6E today if no need for restraints    TODAY'S SUMMARY: Acute coronary syndrome prompting need for mechanical ventilation, some delerium persists but  improving, suspect language barrier, doubt active infection  I have personally obtained a history, examined the patient, evaluated laboratory and imaging results, formulated the assessment and plan and placed orders.   Cyril Mourning MD. Tonny Bollman. [facility] Pager [phone] If no response call [phone]     07/16/2013, 8:03 AM

## 2013-07-16 NOTE — Progress Notes (Signed)
Patient Name: [name]      SUBJECTIVE: 80 year old man with a history of diabetes peripheral vascular disease hypertension and end-stage renal disease admitted to [facility]  With  pulmonary edema.  Troponins were negative and following self extubation, he complained of chest pain or respiratory distress; he ruled in for  non-STEMI. He was reintubated. Catheterization demonstrated normal left ventricular function and severe 3 vessel disease. He was not felt to be a candidate for bypass surgery and was subsequently submitted for PCI of the LAD/RCA  while still intubated. His LVEDP was 22  He underwent DES stenting 4/9;  exttubated yeday  LVEF was normal by cath 4/15  Denies cp or sob  Past Medical History  Diagnosis Date  . Diabetes mellitus   . Hypertension   . Hyperlipidemia   . Glaucoma   . Cataract   . Peripheral vascular disease   . ESRD on dialysis     Scheduled Meds:  Scheduled Meds: . antiseptic oral rinse  15 mL Mouth Rinse QID  . aspirin  81 mg Per Tube Daily  . atorvastatin  40 mg Oral q1800  . chlorhexidine  15 mL Mouth Rinse BID  . clopidogrel  75 mg Oral Q breakfast  . darbepoetin (ARANESP) injection - DIALYSIS  40 mcg Intravenous Q Mon-HD  . famotidine  20 mg Per Tube Daily  . ferric gluconate (FERRLECIT/NULECIT) IV  125 mg Intravenous Q M,W,F-HD  . heparin  2,000 Units Intracatheter Once  . heparin  5,000 Units Subcutaneous 3 times per day  . insulin aspart  2-6 Units Subcutaneous 6 times per day   Continuous Infusions: . sodium chloride Stopped (07/16/13 0700)  . sodium chloride 1,000 mL (07/16/13 0021)    PHYSICAL EXAM Filed Vitals:   07/16/13 0454 07/16/13 0500 07/16/13 0600 07/16/13 0840  BP:  160/42 108/63   Pulse:  103    Temp:    99 F (37.2 C)  TempSrc:    Oral  Resp:  18 20   Height:      Weight: 138 lb 14.2 oz (63 kg)     SpO2:  99% 98%     Lying in the fetal position and unresponsive although moving Good  air movement Regular rate and rhythm Extremities without edema Skin warm and dry Rectal tube in place  TELEMETRY: Reviewed telemetry pt in nsr    Intake/Output Summary (Last 24 hours) at 07/16/13 1003 Last data filed at 07/16/13 0500  Gross per 24 hour  Intake    180 ml  Output      5 ml  Net    175 ml    LABS: Basic Metabolic Panel:  Recent Labs Lab 07/10/13 0525  07/11/13 0246  07/12/13 0255 07/13/13 0500 07/14/13 0500 07/14/13 0700 07/15/13 0500 07/16/13 0400  NA 143  --  135*  --  141 142 137  --  138 139  K 5.3  --  5.3  --  4.1 3.3* 3.5*  --  3.6* 3.7  CL 99  --  89*  --  97 96 94*  --  95* 94*  CO2 24  --  27  --  24 19 21   --  26 20  GLUCOSE 44*  --  138*  --  122* 177* 144*  --  190* 173*  BUN 63*  --  32*  --  25* 36* 52*  --  43* 62*  CREATININE 10.32*  --  6.18*  --  5.16* 5.63* 8.27*  --  6.62* 9.24*  CALCIUM 8.4  --  8.2*  --  8.3* 8.1* 7.9*  --  7.9* 8.2*  MG 2.4  < >  --   < >  --   --   --  2.3 2.2  --   PHOS 2.7  < >  --   < >  --   --   --  4.7* 2.8  --   < > = values in this interval not displayed. Cardiac Enzymes: No results found for this basename: CKTOTAL, CKMB, CKMBINDEX, TROPONINI,  in the last 72 hours CBC:  Recent Labs Lab 07/10/13 0500 07/11/13 0246 07/13/13 0500 07/14/13 0500 07/15/13 0500 07/16/13 0400  WBC 12.8* 12.2* 11.0* 10.1 12.3* 14.0*  HGB 9.2* 10.6* 10.8* 9.3* 9.1* 9.4*  HCT 28.1* 31.6* 32.5* 27.2* 27.6* 28.1*  MCV 97.9 97.2 96.7 94.4 96.8 96.2  PLT 221 PLATELET CLUMPS NOTED ON SMEAR, COUNT APPEARS ADEQUATE 209 200 214 239   PROTIME: No results found for this basename: LABPROT, INR,  in the last 72 hours Liver Function Tests:  Recent Labs  07/15/13 0500  AST 31  ALT 28  ALKPHOS 110  BILITOT 0.3  PROT 6.5  ALBUMIN 2.9*   No results found for this basename: LIPASE, AMYLASE,  in the last 72 hours BNP: BNP (last 3 results)  Recent Labs  07/10/13 0910 07/13/13 0500  PROBNP 54473.0* 47075.0*    D-Dimer: No results found for this basename: DDIMER,  in the last 72 hours Hemoglobin A1C: No results found for this basename: HGBA1C,  in the last 72 hours Fasting Lipid Panel: No results found for this basename: CHOL, HDL, LDLCALC, TRIG, CHOLHDL, LDLDIRECT,  in the last 72 hours    ASSESSMENT AND PLAN:  1. Non-STEMI/normal LV function  2. PCI to LAD/RCA  3. Respiratory failure-extubated  4. End-stage renal disease on dialysis  5.  Encephalopathy  Continue aspirin and Plavix. Begin ACE inhibitors next 24 hours and possibly beta blockers if he is taking by mouth. Early hazard benefit is probably the most important for statins   Signed, [facility] Jakaree Pickard MD  07/16/2013

## 2013-07-17 DIAGNOSIS — F03918 Unspecified dementia, unspecified severity, with other behavioral disturbance: Secondary | ICD-10-CM

## 2013-07-17 DIAGNOSIS — F0391 Unspecified dementia with behavioral disturbance: Secondary | ICD-10-CM

## 2013-07-17 LAB — BASIC METABOLIC PANEL
BUN: 78 mg/dL — ABNORMAL HIGH (ref 6–23)
CO2: 22 meq/L (ref 19–32)
CREATININE: 11.74 mg/dL — AB (ref 0.50–1.35)
Calcium: 7.6 mg/dL — ABNORMAL LOW (ref 8.4–10.5)
Chloride: 92 mEq/L — ABNORMAL LOW (ref 96–112)
GFR calc Af Amer: 4 mL/min — ABNORMAL LOW (ref >90)
GFR calc non Af Amer: 4 mL/min — ABNORMAL LOW (ref >90)
Glucose, Bld: 196 mg/dL — ABNORMAL HIGH (ref 70–99)
POTASSIUM: 4 meq/L (ref 3.7–5.3)
Sodium: 134 mEq/L — ABNORMAL LOW (ref 137–147)

## 2013-07-17 LAB — GLUCOSE, CAPILLARY
GLUCOSE-CAPILLARY: 129 mg/dL — AB (ref 70–99)
GLUCOSE-CAPILLARY: 165 mg/dL — AB (ref 70–99)
GLUCOSE-CAPILLARY: 240 mg/dL — AB (ref 70–99)
Glucose-Capillary: 109 mg/dL — ABNORMAL HIGH (ref 70–99)
Glucose-Capillary: 215 mg/dL — ABNORMAL HIGH (ref 70–99)
Glucose-Capillary: 232 mg/dL — ABNORMAL HIGH (ref 70–99)

## 2013-07-17 LAB — CBC
HEMATOCRIT: 26 % — AB (ref 39.0–52.0)
Hemoglobin: 8.9 g/dL — ABNORMAL LOW (ref 13.0–17.0)
MCH: 32.2 pg (ref 26.0–34.0)
MCHC: 34.2 g/dL (ref 30.0–36.0)
MCV: 94.2 fL (ref 78.0–100.0)
PLATELETS: 254 10*3/uL (ref 150–400)
RBC: 2.76 MIL/uL — ABNORMAL LOW (ref 4.22–5.81)
RDW: 14.7 % (ref 11.5–15.5)
WBC: 11.1 10*3/uL — ABNORMAL HIGH (ref 4.0–10.5)

## 2013-07-17 MED ORDER — NEPRO/CARBSTEADY PO LIQD
237.0000 mL | Freq: Two times a day (BID) | ORAL | Status: DC
  Administered 2013-07-19 (×2): 237 mL via ORAL

## 2013-07-17 MED ORDER — PRO-STAT SUGAR FREE PO LIQD
30.0000 mL | Freq: Two times a day (BID) | ORAL | Status: DC
  Administered 2013-07-17 – 2013-07-20 (×5): 30 mL via ORAL
  Filled 2013-07-17 (×7): qty 30

## 2013-07-17 MED ORDER — CARVEDILOL 6.25 MG PO TABS
6.2500 mg | ORAL_TABLET | Freq: Two times a day (BID) | ORAL | Status: DC
  Administered 2013-07-17: 6.25 mg via ORAL
  Filled 2013-07-17 (×5): qty 1

## 2013-07-17 MED ORDER — RENA-VITE PO TABS
1.0000 | ORAL_TABLET | Freq: Every day | ORAL | Status: DC
  Administered 2013-07-17 – 2013-07-19 (×3): 1 via ORAL
  Filled 2013-07-17 (×4): qty 1

## 2013-07-17 MED ORDER — ISOSORBIDE MONONITRATE ER 30 MG PO TB24
30.0000 mg | ORAL_TABLET | Freq: Every day | ORAL | Status: DC
  Administered 2013-07-17 – 2013-07-20 (×4): 30 mg via ORAL
  Filled 2013-07-17 (×4): qty 1

## 2013-07-17 MED ORDER — RISPERIDONE 0.5 MG PO TABS
0.5000 mg | ORAL_TABLET | Freq: Every day | ORAL | Status: DC
  Administered 2013-07-17: 0.5 mg via ORAL
  Filled 2013-07-17 (×2): qty 1

## 2013-07-17 NOTE — Progress Notes (Signed)
PT Cancellation Note  Patient Details Name: [name] MRN: [id number] DOB: 1933-05-27   Cancelled Treatment:    Reason Eval/Treat Not Completed: Patient at procedure or test/unavailable   Angelina OkCary W Zianne Schubring 07/17/2013, 2:18 PM
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
## 2013-07-17 NOTE — Progress Notes (Signed)
Rehab Admissions Coordinator Note:  Patient was screened by Trish MageEugenia M Kellyanne Ellwanger for appropriateness for an Inpatient Acute Rehab Consult.  At this time, we are recommending Inpatient Rehab consult.  Ellouise Newerugenia M Macaulay Reicher 07/17/2013, 9:16 AM  I can be reached at [phone].

## 2013-07-17 NOTE — Progress Notes (Signed)
NUTRITION FOLLOW UP  Intervention:   Further diet advancement per team's discretion. Add 30 ml Prostat po BID, each supplement provides 100 kcal and 15 grams protein. Recommend renal-MVI once pt advanced beyond clear liquids. RD to continue to follow nutrition care plan.  Nutrition Dx:   Inadequate oral intake now related to limited diet provision AEB need for slow diet advancement.  Goal:   Intake to meet >90% of estimated nutrition needs. Unmet.  Monitor:   weight trend, labs, diet advancement, PO intake  Assessment:   Pt admitted with encephalopathy and pulmonary edema. Pt with ESRD on HD MWF. Pt with underlying heart failure.   S/p cardiac cath and PCI-S on 4/9.  Intubated 4/5 - 4/11. Received Nepro tube feeding during mechanical ventilation. BSE completed by SLP on 4/12 - recommendations for clear, thin liquids with full supervision, 2/2 cognitive deficits.  New dry weight per renal is going to be 60-61 kg (prior dry weight was 67 kg) - this is a weight change of 9%.  Potassium and phosphorus WNL  Height: Ht Readings from Last 1 Encounters:  07/11/13 5\' 4"  (1.626 m)    Weight Status:   Wt Readings from Last 1 Encounters:  07/16/13 143 lb 15.4 oz (65.3 kg)  Post-HD dry weight on 4/10 - 59.1 kg  Re-estimated needs:  Kcal: 1700 - 1900 Protein: 85-100 gm Fluid: 1.2 L  Skin: no wounds  Diet Order: Clear Liquid   Intake/Output Summary (Last 24 hours) at 07/17/13 1043 Last data filed at 07/16/13 1755  Gross per 24 hour  Intake      0 ml  Output      3 ml  Net     -3 ml    Last BM: 4/12   Labs:   Recent Labs Lab 07/11/13 2200  07/14/13 0500 07/14/13 0700 07/15/13 0500 07/16/13 0400  NA  --   < > 137  --  138 139  K  --   < > 3.5*  --  3.6* 3.7  CL  --   < > 94*  --  95* 94*  CO2  --   < > 21  --  26 20  BUN  --   < > 52*  --  43* 62*  CREATININE  --   < > 8.27*  --  6.62* 9.24*  CALCIUM  --   < > 7.9*  --  7.9* 8.2*  MG 2.1  --   --  2.3 2.2   --   PHOS 4.0  --   --  4.7* 2.8  --   GLUCOSE  --   < > 144*  --  190* 173*  < > = values in this interval not displayed.  CBG (last 3)   Recent Labs  07/17/13 0011 07/17/13 0403 07/17/13 0808  GLUCAP 232* 129* 165*    Scheduled Meds: . antiseptic oral rinse  15 mL Mouth Rinse QID  . aspirin  81 mg Per Tube Daily  . atorvastatin  40 mg Oral q1800  . carvedilol  6.25 mg Oral BID WC  . chlorhexidine  15 mL Mouth Rinse BID  . clopidogrel  75 mg Oral Q breakfast  . darbepoetin (ARANESP) injection - DIALYSIS  40 mcg Intravenous Q Mon-HD  . famotidine  20 mg Per Tube Daily  . ferric gluconate (FERRLECIT/NULECIT) IV  125 mg Intravenous Q M,W,F-HD  . heparin  2,000 Units Intracatheter Once  . heparin  5,000 Units Subcutaneous 3 times  per day  . insulin aspart  0-9 Units Subcutaneous 6 times per day    Continuous Infusions: . sodium chloride Stopped (07/16/13 0700)     Antonio MottoSamantha Deziah Renwick MS, RD, LDN Inpatient Registered Dietitian Pager: [phone] After-hours pager: [phone]

## 2013-07-17 NOTE — Progress Notes (Signed)
Speech Language Pathology Treatment: Dysphagia  Patient Details Name: [name] MRN: [id number] DOB: Oct 08, 1933 Today's Date: 07/17/2013 Time: 1000-1030 SLP Time Calculation (min): 30 min  Assessment / Plan / Recommendation Clinical Impression  Pt continues to demonstrates cognitive based dysphagia with poorly sustained attention to oral transit of solids. There is also suspected delay in swallow, though pt appears to tolerate liquids adequately. Despite poor oral phase, pt responded to cues for slowing rate and swallowing, not talking. Will need full supervision with purees, but recommend upgrade while under hospital supervision to determine if pt is safe to continue. Will f/u tomorrow during meal.    HPI HPI: 80 year old man with a history of diabetes peripheral vascular disease hypertension and end-stage renal disease admitted [facility]  With  pulmonary edema.  Troponins were negative and following self extubation, he complained of chest pain or respiratory distress; he ruled in for  non-STEMI. He was reintubated. Catheterization demonstrated normal left ventricular function and severe 3 vessel disease. He was not felt to be a candidate for bypass surgery and was subsequently submitted for PCI of the LAD/RCA  while still intubated. His LVEDP was 22 He underwent DES stenting 4/9;  exttubated yeday   Pertinent Vitals NA  SLP Plan  Continue with current plan of care    Recommendations Diet recommendations: Dysphagia 1 (puree);Thin liquid Liquids provided via: Cup;Straw Medication Administration: Whole meds with puree Supervision: Full supervision/cueing for compensatory strategies Compensations: Slow rate;Small sips/bites Postural Changes and/or Swallow Maneuvers: Seated upright 90 degrees;Upright 30-60 min after meal              Oral Care Recommendations: Oral care BID Follow up Recommendations: None Plan: Continue with current plan of care    GO    [facility] One Surgery CenterBonnie  Jaeleah Smyser, MA CCC-SLP [phone]  Riley NearingBonnie Caroline Burlene Montecalvo 07/17/2013, 11:21 AM

## 2013-07-17 NOTE — Progress Notes (Signed)
Subjective:  Comfortable in bed, no complaints  Objective: Vital signs in last 24 hours: Temp:  [98.3 F (36.8 C)-99.1 F (37.3 C)] 98.3 F (36.8 C) (04/13 0626) Pulse Rate:  [81-94] 83 (04/13 0626) Resp:  [18-33] 18 (04/13 0626) BP: (111-165)/(50-93) 151/50 mmHg (04/13 0626) SpO2:  [94 %-98 %] 97 % (04/12 2152) Weight:  [65.3 kg (143 lb 15.4 oz)] 65.3 kg (143 lb 15.4 oz) (04/12 2152) Weight change: 2.3 kg (5 lb 1.1 oz)  Intake/Output from previous day: 04/12 0701 - 04/13 0700 In: -  Out: 3 [Stool:3]   Lab Results:  Recent Labs  07/15/13 0500 07/16/13 0400  WBC 12.3* 14.0*  HGB 9.1* 9.4*  HCT 27.6* 28.1*  PLT 214 239   BMET:  Recent Labs  07/15/13 0500 07/16/13 0400  NA 138 139  K 3.6* 3.7  CL 95* 94*  CO2 26 20  GLUCOSE 190* 173*  BUN 43* 62*  CREATININE 6.62* 9.24*  CALCIUM 7.9* 8.2*  ALBUMIN 2.9*  --    No results found for this basename: PTH,  in the last 72 hours Iron Studies: No results found for this basename: IRON, TIBC, TRANSFERRIN, FERRITIN,  in the last 72 hours  Studies/Results: Dg Chest Port 1 View  07/16/2013   CLINICAL DATA:  Edema  EXAM: PORTABLE CHEST - 1 VIEW  COMPARISON:  07/14/2013  FINDINGS: Right IJ central venous catheter projects over the distal superior vena cava. Mild cardiomegaly is stable. Atherosclerotic calcification thoracic aortic arch. There is diffuse pulmonary vascular congestion and diffuse interstitial prominence. Aeration of the lungs appear similar to 07/14/2013 chest radiograph. No focal consolidation. No definite visible pleural effusion by portable technique. Negative for pneumothorax. Imaged bones are unremarkable.  IMPRESSION: No significant change and mild pulmonary edema pattern.   Electronically Signed   By: Britta MccreedySusan  Turner M.D.   On: 07/16/2013 08:15   EXAM: General appearance:  Alert, in no apparent distress Resp: CTA without rales, rhonchi, or wheezes Cardio:  RRR with Gr II/VI systolic murmur, no rub GI: + BS,  soft and nontender Extremities:  No edema Access:  AVF @ LUA with + bruit  Dialysis: MWF [facility]  3h 15min 67kg 2/2.5 Bath P4 LUA AVF Heparin 2000  Hectorol none Epo 6200 Venofer 100 /wk on Wed  Assessment/Plan: 1. Respiratory failure - sec to pulmonary edema, previously on vent, now resolved. 2. NSTEMI - severe 3-vessel CAD, s/p PCI to LAD/RCA with DES 4/8, on ASA & Plavix. 3. ESRD - HD on MWF @ [facility], K 3.7.  HD pending. 4. HTN/Volume - BP 151/50 on Carvedilol; wt 65.3 kg, below EDW. 5. Anemia - Hgb 9.4, on Aranesp 40 mcg on Mon, Fe per HD. 6. Sec HPT - Ca 8.2 (9.1 corrected), P 2.8; no Hectorol, no binders. 7. Nutrition - Alb 2.9, clear liquids. 8. DM - on insulin. 9. PAD - s/p L fem-pop bypass.   LOS: 8 days   [name] 07/17/2013,9:36 AM I have seen and examined this patient and agree with plan per [name].   Pulling clots from AVF at HD and unable to dialyze.  He is resting comfortably on RA.  Will get fistulogram and plan HD in AM.  Jorje GuildMichael T Girtrude Enslin,MD 07/17/2013 3:18 PM

## 2013-07-17 NOTE — Progress Notes (Signed)
Patient ID: [name], male   DOB: Oct 03, 1933, 80 y.o.   MRN: [id number]    Subjective:  Lethargic no chest pain   Objective:  Filed Vitals:   07/16/13 1315 07/16/13 1638 07/16/13 2152 07/17/13 0626  BP: 155/65 154/62 154/52 151/50  Pulse: 88 82 81 83  Temp: 99.1 F (37.3 C) 99 F (37.2 C) 98.8 F (37.1 C) 98.3 F (36.8 C)  TempSrc: Axillary Axillary Oral Oral  Resp: 24 21 20 18   Height:      Weight:   143 lb 15.4 oz (65.3 kg)   SpO2: 98% 94% 97%     Intake/Output from previous day:  Intake/Output Summary (Last 24 hours) at 07/17/13 0943 Last data filed at 07/16/13 1755  Gross per 24 hour  Intake      0 ml  Output      3 ml  Net     -3 ml    Physical Exam: Affect appropriate Chronically ill male  HEENT: edentulous  Neck supple with no adenopathy Dressing over Right IJ  JVP normal no bruits no thyromegaly Lungs clear with no wheezing and good diaphragmatic motion Heart:  S1/S2 no murmur, no rub, gallop or click PMI normal Abdomen: benighn, BS positve, no tenderness, no AAA no bruit.  No HSM or HJR Distal pulses intact with no bruits No edema Neuro non-focal Skin warm and dry Fistula LUE    Lab Results: Basic Metabolic Panel:  Recent Labs  32/44/103/02/19 0500 07/16/13 0400  NA 138 139  K 3.6* 3.7  CL 95* 94*  CO2 26 20  GLUCOSE 190* 173*  BUN 43* 62*  CREATININE 6.62* 9.24*  CALCIUM 7.9* 8.2*  MG 2.2  --   PHOS 2.8  --    Liver Function Tests:  Recent Labs  07/15/13 0500  AST 31  ALT 28  ALKPHOS 110  BILITOT 0.3  PROT 6.5  ALBUMIN 2.9*   CBC:  Recent Labs  07/15/13 0500 07/16/13 0400  WBC 12.3* 14.0*  HGB 9.1* 9.4*  HCT 27.6* 28.1*  MCV 96.8 96.2  PLT 214 239    Imaging: Dg Chest Port 1 View  07/16/2013   CLINICAL DATA:  Edema  EXAM: PORTABLE CHEST - 1 VIEW  COMPARISON:  07/14/2013  FINDINGS: Right IJ central venous catheter projects over the distal superior vena cava. Mild cardiomegaly is stable. Atherosclerotic  calcification thoracic aortic arch. There is diffuse pulmonary vascular congestion and diffuse interstitial prominence. Aeration of the lungs appear similar to 07/14/2013 chest radiograph. No focal consolidation. No definite visible pleural effusion by portable technique. Negative for pneumothorax. Imaged bones are unremarkable.  IMPRESSION: No significant change and mild pulmonary edema pattern.   Electronically Signed   By: Britta MccreedySusan  Turner M.D.   On: 07/16/2013 08:15    Cardiac Studies:  ECG:    Telemetry:  SR rates in 70''s no VT   Echo:  SR inferior T wave changes no acute ST elevation   Medications:   . antiseptic oral rinse  15 mL Mouth Rinse QID  . aspirin  81 mg Per Tube Daily  . atorvastatin  40 mg Oral q1800  . chlorhexidine  15 mL Mouth Rinse BID  . clopidogrel  75 mg Oral Q breakfast  . darbepoetin (ARANESP) injection - DIALYSIS  40 mcg Intravenous Q Mon-HD  . famotidine  20 mg Per Tube Daily  . ferric gluconate (FERRLECIT/NULECIT) IV  125 mg Intravenous Q M,W,F-HD  . heparin  2,000 Units Intracatheter Once  .  heparin  5,000 Units Subcutaneous 3 times per day  . insulin aspart  0-9 Units Subcutaneous 6 times per day     . sodium chloride Stopped (07/16/13 0700)    Assessment/Plan:  80 year old man with a history of diabetes peripheral vascular disease hypertension and end-stage renal disease [facility] With pulmonary edema. Troponins were negative and following self extubation, he complained of chest pain or respiratory distress; he ruled in for non-STEMI. He was reintubated. Catheterization demonstrated normal left ventricular function and severe 3 vessel disease. He was not felt to be a candidate for bypass surgery and was subsequently submitted for PCI of the LAD/RCA while still intubated. His LVEDP was 22 He underwent DES stenting 4/9;   LVEF was normal by cath 4/15  Add coreg 6.25 bid  Continue DAT    Wendall Stadeeter C Karlye Ihrig 07/17/2013, 9:43 AM

## 2013-07-17 NOTE — Progress Notes (Signed)
Pt has had 3 loose stools within the last 2 hours. C-diff protocol started and sending stool sample to lab.

## 2013-07-18 ENCOUNTER — Inpatient Hospital Stay (HOSPITAL_COMMUNITY): Payer: Medicare Other

## 2013-07-18 DIAGNOSIS — I214 Non-ST elevation (NSTEMI) myocardial infarction: Secondary | ICD-10-CM

## 2013-07-18 LAB — GLUCOSE, CAPILLARY
GLUCOSE-CAPILLARY: 143 mg/dL — AB (ref 70–99)
GLUCOSE-CAPILLARY: 127 mg/dL — AB (ref 70–99)
Glucose-Capillary: 138 mg/dL — ABNORMAL HIGH (ref 70–99)
Glucose-Capillary: 140 mg/dL — ABNORMAL HIGH (ref 70–99)
Glucose-Capillary: 157 mg/dL — ABNORMAL HIGH (ref 70–99)
Glucose-Capillary: 177 mg/dL — ABNORMAL HIGH (ref 70–99)
Glucose-Capillary: 188 mg/dL — ABNORMAL HIGH (ref 70–99)

## 2013-07-18 LAB — CBC
HCT: 25.3 % — ABNORMAL LOW (ref 39.0–52.0)
Hemoglobin: 8.8 g/dL — ABNORMAL LOW (ref 13.0–17.0)
MCH: 32.2 pg (ref 26.0–34.0)
MCHC: 34.8 g/dL (ref 30.0–36.0)
MCV: 92.7 fL (ref 78.0–100.0)
PLATELETS: 270 10*3/uL (ref 150–400)
RBC: 2.73 MIL/uL — ABNORMAL LOW (ref 4.22–5.81)
RDW: 14.8 % (ref 11.5–15.5)
WBC: 10.3 10*3/uL (ref 4.0–10.5)

## 2013-07-18 LAB — CLOSTRIDIUM DIFFICILE BY PCR: Toxigenic C. Difficile by PCR: POSITIVE — AB

## 2013-07-18 MED ORDER — IOHEXOL 300 MG/ML  SOLN
100.0000 mL | Freq: Once | INTRAMUSCULAR | Status: AC | PRN
  Administered 2013-07-18: 50 mL via INTRAVENOUS

## 2013-07-18 MED ORDER — FENTANYL CITRATE 0.05 MG/ML IJ SOLN
INTRAMUSCULAR | Status: AC | PRN
  Administered 2013-07-18: 50 ug via INTRAVENOUS

## 2013-07-18 MED ORDER — MIDAZOLAM HCL 2 MG/2ML IJ SOLN
INTRAMUSCULAR | Status: AC
  Filled 2013-07-18: qty 2

## 2013-07-18 MED ORDER — HEPARIN SODIUM (PORCINE) 1000 UNIT/ML IJ SOLN
INTRAMUSCULAR | Status: AC | PRN
  Administered 2013-07-18: 2000 [IU] via INTRAVENOUS
  Administered 2013-07-18: 3000 [IU] via INTRAVENOUS

## 2013-07-18 MED ORDER — CARVEDILOL 12.5 MG PO TABS
12.5000 mg | ORAL_TABLET | Freq: Two times a day (BID) | ORAL | Status: DC
  Administered 2013-07-19 – 2013-07-20 (×4): 12.5 mg via ORAL
  Filled 2013-07-18 (×6): qty 1

## 2013-07-18 MED ORDER — HEPARIN SODIUM (PORCINE) 1000 UNIT/ML IJ SOLN
INTRAMUSCULAR | Status: AC
  Administered 2013-07-18: 2000 [IU]
  Filled 2013-07-18: qty 1

## 2013-07-18 MED ORDER — METRONIDAZOLE 500 MG PO TABS
500.0000 mg | ORAL_TABLET | Freq: Three times a day (TID) | ORAL | Status: DC
  Administered 2013-07-19 – 2013-07-20 (×6): 500 mg via ORAL
  Filled 2013-07-18 (×10): qty 1

## 2013-07-18 MED ORDER — FENTANYL CITRATE 0.05 MG/ML IJ SOLN
INTRAMUSCULAR | Status: AC
  Filled 2013-07-18: qty 2

## 2013-07-18 MED ORDER — RISPERIDONE 0.5 MG PO TABS
0.5000 mg | ORAL_TABLET | Freq: Two times a day (BID) | ORAL | Status: DC
  Administered 2013-07-19 – 2013-07-20 (×4): 0.5 mg via ORAL
  Filled 2013-07-18 (×5): qty 1

## 2013-07-18 NOTE — Sedation Documentation (Signed)
Sedation medication drawn up on procedure table d/t no IV access

## 2013-07-18 NOTE — Progress Notes (Signed)
PT Cancellation Note  Patient Details Name: Titus Mouldntonio Boruff MRN: [id number] DOB: [birth year]   Cancelled Treatment:    Reason Eval/Treat Not Completed: Patient at procedure or test/unavailable  Bayard Huggereresa K. BenhamBrown, South CarolinaPT [phone] 07/18/2013, 2:16 PM

## 2013-07-18 NOTE — Progress Notes (Signed)
Subjective:  No complaints, except hungry, NPO for fistulogram  Objective: Vital signs in last 24 hours: Temp:  [97.4 F (36.3 C)-99.3 F (37.4 C)] 97.4 F (36.3 C) (04/14 0507) Pulse Rate:  [71-79] 71 (04/14 0507) Resp:  [18-20] 18 (04/14 0507) BP: (129-162)/(45-65) 162/65 mmHg (04/14 0507) SpO2:  [96 %-99 %] 96 % (04/14 0507) Weight change:   Intake/Output from previous day: 04/13 0701 - 04/14 0700 In: 60 [P.O.:60] Out: 3 [Stool:3]   Lab Results:  Recent Labs  07/16/13 0400 07/17/13 1240  WBC 14.0* 11.1*  HGB 9.4* 8.9*  HCT 28.1* 26.0*  PLT 239 254   BMET:  Recent Labs  07/16/13 0400 07/17/13 1240  NA 139 134*  K 3.7 4.0  CL 94* 92*  CO2 20 22  GLUCOSE 173* 196*  BUN 62* 78*  CREATININE 9.24* 11.74*  CALCIUM 8.2* 7.6*   No results found for this basename: PTH,  in the last 72 hours Iron Studies: No results found for this basename: IRON, TIBC, TRANSFERRIN, FERRITIN,  in the last 72 hours  Studies/Results: No results found.  EXAM:  General appearance: Alert, in no apparent distress  Resp: CTA without rales, rhonchi, or wheezes  Cardio: RRR with Gr II/VI systolic murmur, no rub  GI: + BS, soft and nontender  Extremities: No edema  Access: AVF @ LUA with + bruit   Dialysis: MWF [facility]  3h 15min 67kg 2/2.5 Bath P4 LUA AVF Heparin 2000  Hectorol none Epo 6200 Venofer 100 /wk on Wed  Assessment/Plan: 1. Respiratory failure - sec to pulmonary edema, previously on vent, now resolved. 2. NSTEMI - severe 3-vessel CAD, s/p PCI to LAD/RCA with DES 4/8, on ASA & Plavix. 3. Dialysis access - unable to cannulate yesterday sec to clots, although + bruit.  Fistulogram today per IR. 4. ESRD - HD on MWF @ [facility], K 4. HD after fistulogram. 5. HTN/Volume - BP 162/65 on Carvedilol; wt 65.3 kg, below EDW. 6. Anemia - Hgb down to 8.9, on Aranesp 40 mcg on Mon, Fe per HD. 7. Sec HPT - Ca 7.6 (8.5 corrected), P 2.8; 2.5Ca bath, no Hectorol, no binders. 8. Nutrition - Alb  2.9, clear liquids. 9. DM - on insulin. 10. PAD - s/p L fem-pop bypass.     LOS: 9 days   Gerome Apleyharles Lyles 07/18/2013,7:42 AM I have seen and examined this patient and agree with plan per Gerome Apleyharles Lyles.  To go for fistulogram this AM and then for HD.Jorje Guild. Ildefonso Keaney T Lamar Naef,MD 07/18/2013 9:10 AM

## 2013-07-18 NOTE — Procedures (Signed)
Post-Procedure Note  Pre-operative Diagnosis: Pulling clots from fistula       Post-operative Diagnosis: Thrombus in fistula   Indications: Pulling clots during HD  Procedure Details:  Informed consent obtained from family.  Fistulogram demonstrated stenosis and possible clot in mid humerus cephalic vein.  When a wire was placed across the stenosis, there was thrombosis.  The thrombosis was treated with Angiojet thrombectomy and balloon angioplasty with 6 mm and 7 mm balloons.  Flow was restored in the vein at end of the procedure. Sheath removed with purse string suture.   Findings: Focal stenosis and thrombus in mid humerus cephalic vein.  This area became occlusive after a wire was placed and it was difficult to restore flow throughout the vein.  Eventually, the bulk of the thrombus resolved with the Angiojet device.  There was flow in the left cephalic vein at the end of the procedure with residual non-occlusive thrombus in the left upper arm cephalic vein .  Central veins patent.   Complications: None     Condition: Stable  Plan: Ready to use fistula for dialysis.

## 2013-07-18 NOTE — Progress Notes (Signed)
Subjective: Pt admitted with respiratory failure and NSTEMI. Also has hx of ESRD. Was on HD yesterday and reports of pulling clots from (L)UE AVF.  IR is asked to do fistulogram. Chart, PMHx, meds reviewed.  Objective: Physical Exam: BP 162/65  Pulse 71  Temp(Src) 97.4 F (36.3 C) (Oral)  Resp 18  Ht 5\' 4"  (1.626 m)  Wt 143 lb 15.4 oz (65.3 kg)  BMI 24.70 kg/m2  SpO2 96% (L)UE AVF palpable thrill and bruit. Hand warm. Lungs: CTA Heart: Reg    Labs: CBC  Recent Labs  07/16/13 0400 07/17/13 1240  WBC 14.0* 11.1*  HGB 9.4* 8.9*  HCT 28.1* 26.0*  PLT 239 254   BMET  Recent Labs  07/16/13 0400 07/17/13 1240  NA 139 134*  K 3.7 4.0  CL 94* 92*  CO2 20 22  GLUCOSE 173* 196*  BUN 62* 78*  CREATININE 9.24* 11.74*  CALCIUM 8.2* 7.6*    Studies/Results: No results found.  Assessment/Plan: Fistulogram shows tight stnosis amenable to angioplasty. Small associated clot in the region of attempted access. Discussed plan for angioplasty of stenotic segment and completion fistulogram. Explained procedure, risks. Daughter is present and has signed consent.    LOS: 9 days    Brayton ElKevin Myrella Fahs PA-C 07/18/2013 2:53 PM

## 2013-07-18 NOTE — Progress Notes (Signed)
[facility] PROGRESS NOTE  [name]:[id number] DOB: 12/17/1933 DOA: 07/09/2013 PCP: Antonio Shivers, MD  Assessment/Plan: 1-acute Respiratory failure - sec to pulmonary edema and NSTEMI Required to intubated shortly, now off ventilatory support and breathing good -no SOB, no wheezing and no crackles on exam -will continue HD for volume control  2-NSTEMI - severe 3-vessel CAD, s/p PCI to LAD/RCA with DES 4/8. -cardiology on board, will follow recommendations  -plan is to start coreg BID low dose (12.5 mg BID) -start imdur -continue ASA, plavix and statins  3-ESRD - HD on MWF -patient had trouble with fistula on 4/13; required fistulogram and angiojet therapy -plan is for HD today and again in am to return to original schedule -renal on board, will follow rec's  4-HTN/Volume - continue carvedilol(dose adjusted by cardiology to 12.5 BID) and will continue imdur.  5-Anemia - Hgb 9.4, on Aranesp 40 mcg on Mon and Fe per HD.   6-Sec HPT - no Hectorol or binders. Will follow renal recommendations  7-Nutrition - Alb 2.9, started on dysphagia 1 with thin liquids -will start nepro  8-DM - continue SSI and low carb diet  9-PAD - s/p L fem-pop bypass. -continue ASA and plavix now  10-protein calorie malnutrition, moderate: in setting of acute NSTEMI and poor intake -continue prostat and nepro -albumin 2.9  11-acute encephalopathy, mild dementia and confusion:  -patient remains AAOX2, able to follow commands and provide accurate information in his own language  -language barrier with huge impact into current presumed AMS and hospital delirium -will continue risperdal, but will change to BID. Patient has a quite night and less agitated.  12-C. Diff colitis: started on flagyl. -contact precautions ordered.  13-deconditioning and weakness: will ne SNF at discharge for rehabilitation.  TWK:[id number]   Code Status: Full Family Communication: caregiver by phone and  Daughter [name]) at bedside Disposition Plan: to be determine   Consultants:  Renal service  Cardiology  PCCM  Procedures:  See below for x-ray reports  Cath with PCI to LAD/RCA 4/8.15 (3 vessels disease but not a candidate for CABG). Normal EF per cath.   Antibiotics:  None   HPI/Subjective: Spanish speaking only; slight confused regarding reason for admission and forgetfull; but AAOX2 and able to follow commands properly. No fever. Positive diarrhea overnight (which came back positive for C. Diff)  Objective: Filed Vitals:   07/18/13 1636  BP: 160/50  Pulse: 70  Temp:   Resp: 16    Intake/Output Summary (Last 24 hours) at 07/18/13 1821 Last data filed at 07/18/13 1300  Gross per 24 hour  Intake     60 ml  Output      2 ml  Net     58 ml   Filed Weights   07/15/13 0500 07/16/13 0454 07/16/13 2152  Weight: 60.4 kg (133 lb 2.5 oz) 63 kg (138 lb 14.2 oz) 65.3 kg (143 lb 15.4 oz)    Exam:   General:  AAOX2; slight confabulation, confusion and forgetfulness appreciated (unknown baseline; but after discussing with daughter and caregiver, imagine mild underlying dementia)   Cardiovascular: S1 and s2, no rubs or gallops; positive SEM  Respiratory: CTA bilaterally  Abdomen: soft, NT, ND, positive BS  Musculoskeletal:no edema, no cyanosis; LUE with AVF (positive bruit)  Data Reviewed: Basic Metabolic Panel:  Recent Labs Lab 07/11/13 1940 07/11/13 2200  07/13/13 0500 07/14/13 0500 07/14/13 0700 07/15/13 0500 07/16/13 0400 07/17/13 1240  NA  --   --   < > 142  137  --  138 139 134*  K  --   --   < > 3.3* 3.5*  --  3.6* 3.7 4.0  CL  --   --   < > 96 94*  --  95* 94* 92*  CO2  --   --   < > 19 21  --  _0 GLUCOSE  --   --   < > 177* 144*  --  190* 173* 196*  BUN  --   --   < > 36* 52*  --  43* 62* 78*  CREATININE  --   --   < > 5.63* 8.27*  --  6.62* 9.24* 11.74*  CALCIUM  --   --   < > 8.1* 7.9*  --  7.9* 8.2* 7.6*  MG 2.1 2.1  --    --   --  2.3 2.2  --   --   PHOS 2.5 4.0  --   --   --  4.7* 2.8  --   --   < > = values in this interval not displayed. Liver Function Tests:  Recent Labs Lab 07/11/13 1940 07/15/13 0500  AST 36 31  ALT 20 28  ALKPHOS 85 110  BILITOT 0.5 0.3  PROT 7.9 6.5  ALBUMIN 3.8 2.9*   CBC:  Recent Labs Lab 07/13/13 0500 07/14/13 0500 07/15/13 0500 07/16/13 0400 07/17/13 1240  WBC 11.0* 10.1 12.3* 14.0* 11.1*  HGB 10.8* 9.3* 9.1* 9.4* 8.9*  HCT 32.5* 27.2* 27.6* 28.1* 26.0*  MCV 96.7 94.4 96.8 96.2 94.2  PLT 209 200 214 239 254   Cardiac Enzymes:  Recent Labs Lab 07/11/13 2200 07/12/13 0255 07/13/13 0500  TROPONINI 5.35* 9.94* 5.40*   BNP (last 3 results)  Recent Labs  07/10/13 0910 07/13/13 0500  PROBNP 54473.0* 47075.0*   CBG:  Recent Labs Lab 07/18/13 0410 07/18/13 0459 07/18/13 0827 07/18/13 1202 07/18/13 1718  GLUCAP 157* 140* 143* 177* 138*    Recent Results (from the past 240 hour(s))  MRSA PCR SCREENING     Status: None   Collection Time    07/09/13  3:14 PM      Result Value Ref Range Status   MRSA by PCR NEGATIVE  NEGATIVE Final   Comment:            The GeneXpert MRSA Assay (FDA     approved for NASAL specimens     only), is one component of a     comprehensive MRSA colonization     surveillance program. It is not     intended to diagnose MRSA     infection nor to guide or     monitor treatment for     MRSA infections.  CULTURE, BLOOD (ROUTINE X 2)     Status: None   Collection Time    07/09/13  7:00 PM      Result Value Ref Range Status   Specimen Description BLOOD RIGHT ANTECUBITAL   Final   Special Requests BOTTLES DRAWN AEROBIC AND ANAEROBIC 10CC EA   Final   Culture  Setup Time     Final   Value: 07/10/2013 02:23     Performed at [facility]   Culture     Final   Value: NO GROWTH 5 DAYS     Performed at [facility]   Report Status 07/16/2013 FINAL   Final  CULTURE, BLOOD (ROUTINE X 2)     Status:  None    Collection Time    07/09/13  7:10 PM      Result Value Ref Range Status   Specimen Description BLOOD RIGHT FOREARM   Final   Special Requests BOTTLES DRAWN AEROBIC ONLY 3CC   Final   Culture  Setup Time     Final   Value: 07/10/2013 02:23     Performed at [facility]   Culture     Final   Value: NO GROWTH 5 DAYS     Performed at [facility]   Report Status 07/16/2013 FINAL   Final  CULTURE, RESPIRATORY (NON-EXPECTORATED)     Status: None   Collection Time    07/09/13  7:20 PM      Result Value Ref Range Status   Specimen Description TRACHEAL ASPIRATE   Final   Special Requests NONE   Final   Gram Stain     Final   Value: FEW WBC PRESENT,BOTH PMN AND MONONUCLEAR     NO SQUAMOUS EPITHELIAL CELLS SEEN     FEW GRAM POSITIVE COCCI IN PAIRS AND CHAINS     Performed at [facility]   Culture     Final   Value: Non-Pathogenic Oropharyngeal-type Flora Isolated.     Performed at [facility]   Report Status 07/12/2013 FINAL   Final  RESPIRATORY VIRUS PANEL     Status: None   Collection Time    07/10/13 12:06 PM      Result Value Ref Range Status   Source - RVPAN TRACHEAL ASPIRATE   Final   Respiratory Syncytial Virus A NOT DETECTED   Final   Respiratory Syncytial Virus B NOT DETECTED   Final   Influenza A NOT DETECTED   Final   Influenza B NOT DETECTED   Final   Parainfluenza 1 NOT DETECTED   Final   Parainfluenza 2 NOT DETECTED   Final   Parainfluenza 3 NOT DETECTED   Final   Metapneumovirus NOT DETECTED   Final   Rhinovirus NOT DETECTED   Final   Adenovirus NOT DETECTED   Final   Influenza A H1 NOT DETECTED   Final   Influenza A H3 NOT DETECTED   Final   Comment: (NOTE)           Normal Reference Range for each Analyte: NOT DETECTED     Testing performed using the Luminex xTAG Respiratory Viral Panel test     kit.     This test was developed and its performance characteristics determined     by [facility]. It has not been cleared or  approved by the [place]     Food and Drug Administration. This test is used for clinical purposes.     It should not be regarded as investigational or for research. This     laboratory is certified under the [place] (CLIA) as qualified to perform high complexity     clinical laboratory testing.     Performed at [facility] DIFFICILE BY PCR     Status: Abnormal   Collection Time    07/17/13  6:33 PM      Result Value Ref Range Status   C difficile by pcr POSITIVE (*) NEGATIVE Final   Comment: CRITICAL RESULT CALLED TO, READ BACK BY AND VERIFIED WITH:     Loren Racer RN 11:10 07/18/13 ([email or web address])     Studies: Ir Elmer Sow  Venous Left  07/18/2013   CLINICAL DATA:  80 year old with end-stage renal disease and pulling clots during dialysis. The patient has a left upper extremity cephalic vein fistula.  EXAM: LEFT UPPER EXTREMITY FISTULOGRAM; VENOUS ANGIOPLASTY; FISTULA DECLOT  Physician: Stephan Minister. Henn, MD  FLUOROSCOPY TIME:  12 min and 24 seconds  MEDICATIONS AND MEDICAL HISTORY: 50 mcg fentanyl.  ANESTHESIA/SEDATION: A radiology nurse monitored the patient throughout the procedure.  PROCEDURE: The patient has an altered mental status and unable to give consent. Informed consent was obtained from the patient's daughter. Angiocath was placed in the left upper arm cephalic vein. Fistulogram images were obtained. Arm was prepped and draped in a sterile fashion. Maximal barrier sterile technique was utilized including caps, mask, sterile gowns, sterile gloves, sterile drape, hand hygiene and skin antiseptic. The skin was anesthetized with 1% lidocaine. Angiocath was eventually exchanged for 6 French vascular sheath over a stiff Amplatz wire. The catheter exchange was difficult due to the stenosis in the cephalic vein. A Bentson wire was advanced centrally. At this point, venogram demonstrated that the stenosis or thrombus was now occlusive. The presumed  stenosis was treated with a 6 mm x 40 mm Conquest balloon. Follow venograms demonstrated persistent occlusion at this site. As a result, the patient was given 3000 units heparin through the sheath. An additional 2000 units were given approximately 30 per min later. The focal thrombosis was treated with the Angiojet thrombectomy device. This area was also treated with a 7 mm x 40 mm Conquest balloon. There was persistent occlusion in the left cephalic vein. A 5 French catheter was advanced into the central veins and a pull-back venogram was obtained. The thrombus had migrated into the upper cephalic vein. The upper cephalic vein was successfully treated with the Angiojet thrombectomy device and treated with the 6 mm balloon. At this point, there was flow throughout the cephalic vein. Follow up venograms were obtained. The vascular sheath was removed with a pursestring suture.  FINDINGS: Initial fistulogram images demonstrated a severe stenosis in the left cephalic vein in the mid humeral region. The central veins and arterial anastomosis were patent. This severe stenosis was related to focal thrombus. Following placement of a catheter and wire, there was thrombosis of the cephalic vein. Ultrasound demonstrated thrombus in the mid humeral region. This area was treated with the Angiojet thrombectomy device. Thrombus did migrate into the more proximal cephalic vein and successful treated with the Angiojet device. Throughout the procedure, the patient appeared to be developing additional thrombus in the cephalic vein and, therefore, the patient was anticoagulated. At the end of procedure, there was improved flow throughout the left cephalic vein but there are was nonocclusive residual thrombus in the mid and lower humeral regions. Central veins are patent.  IMPRESSION: Treatment of focal thrombus in the left cephalic vein. At the end of the procedure, the occlusive thrombus was removed but there was residual nonocclusive  thrombus.   Electronically Signed   By: Markus Daft M.D.   On: 07/18/2013 18:10    Scheduled Meds: . antiseptic oral rinse  15 mL Mouth Rinse QID  . aspirin  81 mg Per Tube Daily  . atorvastatin  40 mg Oral q1800  . carvedilol  12.5 mg Oral BID WC  . chlorhexidine  15 mL Mouth Rinse BID  . clopidogrel  75 mg Oral Q breakfast  . darbepoetin (ARANESP) injection - DIALYSIS  40 mcg Intravenous Q Mon-HD  . famotidine  20 mg Per Tube Daily  .  feeding supplement (NEPRO CARB STEADY)  237 mL Oral BID BM  . feeding supplement (PRO-STAT SUGAR FREE 64)  30 mL Oral BID  . fentaNYL      . ferric gluconate (FERRLECIT/NULECIT) IV  125 mg Intravenous Q M,W,F-HD  . heparin      . heparin  2,000 Units Intracatheter Once  . heparin  5,000 Units Subcutaneous 3 times per day  . insulin aspart  0-9 Units Subcutaneous 6 times per day  . isosorbide mononitrate  30 mg Oral Daily  . metroNIDAZOLE  500 mg Oral 3 times per day  . multivitamin  1 tablet Oral QHS  . risperiDONE  0.5 mg Oral QHS   Continuous Infusions: . sodium chloride Stopped (07/16/13 0700)       Time spent: >30 minutes   [facility] Pager [phone]. If 7PM-7AM, please contact night-coverage at [email or web address], password [place] 07/18/2013, 6:21 PM  LOS: 9 days

## 2013-07-18 NOTE — Progress Notes (Signed)
Pt confused/disoriented. Repeatedly removing tele. MD notified, will continue to monitor.

## 2013-07-18 NOTE — Progress Notes (Signed)
Patient Name: [name] Date of Encounter: 07/18/2013  Principal Problem:   Acute encephalopathy Active Problems:   Peripheral vascular disease, unspecified   ESRD on dialysis   Unspecified essential hypertension   Type II or unspecified type diabetes mellitus with peripheral circulatory disorders, uncontrolled(250.72)   Acute respiratory failure   Non-ST elevation myocardial infarction (NSTEMI), initial episode of care    Patient Profile: 80 yo Spanish-speaking male w/ESRD on HD, PVD and DM was admitted 04/05 w/ SOB, MS changes. ETT 04/05-07 w/ self extubation, re-intubated 04/07-11; NSTEMI w/ cath 04/08, 3V dz but TCTS said too unstable for CABG, PCI LAD and RCA w/ DES x 2 on 04/09; EF 65%  SUBJECTIVE: Pt sitting at the desk because he kept trying to get out of bed. Denies chest pain or SOB. Speaks a little [place].  OBJECTIVE Filed Vitals:   07/17/13 1355 07/17/13 1404 07/17/13 2206 07/18/13 0507  BP: 146/60 134/59 129/48 162/65  Pulse: 75 74  71  Temp:   99.3 F (37.4 C) 97.4 F (36.3 C)  TempSrc:   Oral Oral  Resp: 20 20 18 18   Height:      Weight:      SpO2: 98% 98% 99% 96%    Intake/Output Summary (Last 24 hours) at 07/18/13 1223 Last data filed at 07/18/13 0700  Gross per 24 hour  Intake     60 ml  Output      3 ml  Net     57 ml   Filed Weights   07/15/13 0500 07/16/13 0454 07/16/13 2152  Weight: 133 lb 2.5 oz (60.4 kg) 138 lb 14.2 oz (63 kg) 143 lb 15.4 oz (65.3 kg)    PHYSICAL EXAM General: Well developed, well nourished, male in no acute distress. Head: Normocephalic, atraumatic.  Neck: Supple without bruits, JVD minimal elevation. Lungs:  Resp regular and unlabored, dry rales bases. Heart: RRR, S1, S2, no S3, S4, 2/6 murmur; no rub. Abdomen: Soft, non-tender, non-distended, BS + x 4.  Extremities: No clubbing, cyanosis, no edema.  Neuro: Alert and oriented X 1. Moves all extremities spontaneously. Psych: Normal  affect.  LABS: CBC:  Recent Labs  07/16/13 0400 07/17/13 1240  WBC 14.0* 11.1*  HGB 9.4* 8.9*  HCT 28.1* 26.0*  MCV 96.2 94.2  PLT 239 254   Basic Metabolic Panel:  Recent Labs  16/01/9603/12/15 0400 07/17/13 1240  NA 139 134*  K 3.7 4.0  CL 94* 92*  CO2 20 22  GLUCOSE 173* 196*  BUN 62* 78*  CREATININE 9.24* 11.74*  CALCIUM 8.2* 7.6*   BNP: Pro B Natriuretic peptide (BNP)  Date/Time Value Ref Range Status  07/13/2013  5:00 AM 47075.0* 0 - 450 pg/mL Final  07/10/2013  9:10 AM 54473.0* 0 - 450 pg/mL Final   Lab Results  Component Value Date   TROPONINI 5.40* 07/13/2013    TELE:  SR w/ PVCs      Current Medications:  . antiseptic oral rinse  15 mL Mouth Rinse QID  . aspirin  81 mg Per Tube Daily  . atorvastatin  40 mg Oral q1800  . carvedilol  6.25 mg Oral BID WC  . chlorhexidine  15 mL Mouth Rinse BID  . clopidogrel  75 mg Oral Q breakfast  . darbepoetin (ARANESP) injection - DIALYSIS  40 mcg Intravenous Q Mon-HD  . famotidine  20 mg Per Tube Daily  . feeding supplement (NEPRO CARB STEADY)  237 mL Oral BID BM  .  feeding supplement (PRO-STAT SUGAR FREE 64)  30 mL Oral BID  . ferric gluconate (FERRLECIT/NULECIT) IV  125 mg Intravenous Q M,W,F-HD  . heparin  2,000 Units Intracatheter Once  . heparin  5,000 Units Subcutaneous 3 times per day  . insulin aspart  0-9 Units Subcutaneous 6 times per day  . isosorbide mononitrate  30 mg Oral Daily  . multivitamin  1 tablet Oral QHS  . risperiDONE  0.5 mg Oral QHS   . sodium chloride Stopped (07/16/13 0700)    ASSESSMENT AND PLAN:   Non-ST elevation myocardial infarction (NSTEMI), initial episode of care - s/p DES to RCA and LAD, CFX 90%, OM 1 95%, medical Rx. On ASA, Plavix, Coreg 6.25 BID and statin. BP is up and HR > 70 so will increase Coreg to 12.5 mg BID, can give a lower dose pre-HD if needed.   Otherwise, per IM, Renal teams. Principal Problem:   Acute encephalopathy Active Problems:   Peripheral vascular  disease, unspecified   ESRD on dialysis   Unspecified essential hypertension   Type II or unspecified type diabetes mellitus with peripheral circulatory disorders, uncontrolled(250.72)   Acute respiratory failure   Wendall StadePeter C Ivah Girardot

## 2013-07-18 NOTE — Progress Notes (Signed)
SLP Cancellation Note  Patient Details Name: [name] MRN: [id number] DOB: Jan 04, 1934   Cancelled treatment:       Reason Eval/Treat Not Completed: Patient at procedure or test/unavailable. Pt in NPO for a procedure, will f/u tomorrow.    Riley NearingBonnie Caroline Derry Arbogast 07/18/2013, 10:00 AM

## 2013-07-18 NOTE — Sedation Documentation (Signed)
[facility]

## 2013-07-18 NOTE — Sedation Documentation (Signed)
Pt restless, dtr at bs to instruct pt to lie still
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
## 2013-07-19 DIAGNOSIS — R5381 Other malaise: Secondary | ICD-10-CM

## 2013-07-19 LAB — CBC
HEMATOCRIT: 28.5 % — AB (ref 39.0–52.0)
Hemoglobin: 9.6 g/dL — ABNORMAL LOW (ref 13.0–17.0)
MCH: 32 pg (ref 26.0–34.0)
MCHC: 33.7 g/dL (ref 30.0–36.0)
MCV: 95 fL (ref 78.0–100.0)
Platelets: 296 10*3/uL (ref 150–400)
RBC: 3 MIL/uL — AB (ref 4.22–5.81)
RDW: 14.9 % (ref 11.5–15.5)
WBC: 9.4 10*3/uL (ref 4.0–10.5)

## 2013-07-19 LAB — GLUCOSE, CAPILLARY
GLUCOSE-CAPILLARY: 210 mg/dL — AB (ref 70–99)
GLUCOSE-CAPILLARY: 261 mg/dL — AB (ref 70–99)
Glucose-Capillary: 245 mg/dL — ABNORMAL HIGH (ref 70–99)
Glucose-Capillary: 248 mg/dL — ABNORMAL HIGH (ref 70–99)

## 2013-07-19 MED ORDER — SODIUM CHLORIDE 0.9 % IV SOLN
125.0000 mg | INTRAVENOUS | Status: DC
  Administered 2013-07-20: 125 mg via INTRAVENOUS
  Filled 2013-07-19 (×2): qty 10

## 2013-07-19 NOTE — Progress Notes (Signed)
Subjective:  Sleepy, no complaints  Objective: Vital signs in last 24 hours: Temp:  [97.3 F (36.3 C)-99.8 F (37.7 C)] 98.4 F (36.9 C) (04/15 0840) Pulse Rate:  [68-105] 105 (04/15 0840) Resp:  [12-25] 18 (04/15 0840) BP: (83-166)/(33-70) 140/37 mmHg (04/15 0840) SpO2:  [94 %-100 %] 95 % (04/15 0840) Weight:  [63.2 kg (139 lb 5.3 oz)] 63.2 kg (139 lb 5.3 oz) (04/15 0451) Weight change:   Intake/Output from previous day: 04/14 0701 - 04/15 0700 In: 240 [P.O.:240] Out: 1952 [Stool:1] Intake/Output this shift:   Lab Results:  Recent Labs  07/18/13 1900 07/19/13 0507  WBC 10.3 9.4  HGB 8.8* 9.6*  HCT 25.3* 28.5*  PLT 270 296   BMET:  Recent Labs  07/17/13 1240  NA 134*  K 4.0  CL 92*  CO2 22  GLUCOSE 196*  BUN 78*  CREATININE 11.74*  CALCIUM 7.6*   No results found for this basename: PTH,  in the last 72 hours Iron Studies: No results found for this basename: IRON, TIBC, TRANSFERRIN, FERRITIN,  in the last 72 hours  Studies/Results: Ir Pta Venous Left  07/18/2013   CLINICAL DATA:  80 year old with end-stage renal disease and pulling clots during dialysis. The patient has a left upper extremity cephalic vein fistula.  EXAM: LEFT UPPER EXTREMITY FISTULOGRAM; VENOUS ANGIOPLASTY; FISTULA DECLOT  Physician: Rachelle HoraAdam R. Henn, MD  FLUOROSCOPY TIME:  12 min and 24 seconds  MEDICATIONS AND MEDICAL HISTORY: 50 mcg fentanyl.  ANESTHESIA/SEDATION: A radiology nurse monitored the patient throughout the procedure.  PROCEDURE: The patient has an altered mental status and unable to give consent. Informed consent was obtained from the patient's daughter. Angiocath was placed in the left upper arm cephalic vein. Fistulogram images were obtained. Arm was prepped and draped in a sterile fashion. Maximal barrier sterile technique was utilized including caps, mask, sterile gowns, sterile gloves, sterile drape, hand hygiene and skin antiseptic. The skin was anesthetized with 1% lidocaine.  Angiocath was eventually exchanged for 6 French vascular sheath over a stiff Amplatz wire. The catheter exchange was difficult due to the stenosis in the cephalic vein. A Bentson wire was advanced centrally. At this point, venogram demonstrated that the stenosis or thrombus was now occlusive. The presumed stenosis was treated with a 6 mm x 40 mm Conquest balloon. Follow venograms demonstrated persistent occlusion at this site. As a result, the patient was given 3000 units heparin through the sheath. An additional 2000 units were given approximately 30 per min later. The focal thrombosis was treated with the Angiojet thrombectomy device. This area was also treated with a 7 mm x 40 mm Conquest balloon. There was persistent occlusion in the left cephalic vein. A 5 French catheter was advanced into the central veins and a pull-back venogram was obtained. The thrombus had migrated into the upper cephalic vein. The upper cephalic vein was successfully treated with the Angiojet thrombectomy device and treated with the 6 mm balloon. At this point, there was flow throughout the cephalic vein. Follow up venograms were obtained. The vascular sheath was removed with a pursestring suture.  FINDINGS: Initial fistulogram images demonstrated a severe stenosis in the left cephalic vein in the mid humeral region. The central veins and arterial anastomosis were patent. This severe stenosis was related to focal thrombus. Following placement of a catheter and wire, there was thrombosis of the cephalic vein. Ultrasound demonstrated thrombus in the mid humeral region. This area was treated with the Angiojet thrombectomy device. Thrombus did migrate  into the more proximal cephalic vein and successful treated with the Angiojet device. Throughout the procedure, the patient appeared to be developing additional thrombus in the cephalic vein and, therefore, the patient was anticoagulated. At the end of procedure, there was improved flow  throughout the left cephalic vein but there are was nonocclusive residual thrombus in the mid and lower humeral regions. Central veins are patent.  IMPRESSION: Treatment of focal thrombus in the left cephalic vein. At the end of the procedure, the occlusive thrombus was removed but there was residual nonocclusive thrombus.   Electronically Signed   By: Richarda OverlieAdam  Henn M.D.   On: 07/18/2013 18:10   EXAM:  General appearance: Alert, in no apparent distress  Resp: CTA without rales, rhonchi, or wheezes  Cardio: RRR with Gr II/VI systolic murmur, no rub  GI: + BS, soft and nontender  Extremities: No edema  Access: AVF @ LUA with + bruit   Dialysis: MWF [facility]  3h 15min 67kg 2/2.5 Bath P4 LUA AVF Heparin 2000  Hectorol none Epo 6200 Venofer 100 /wk on Wed  Assessment/Plan: 1. Respiratory failure - sec to pulmonary edema, previously on vent, now resolved.  2. NSTEMI - severe 3-vessel CAD, s/p PCI to LAD/RCA with DES 4/8, on ASA & Plavix.  3. Dialysis access - unable to cannulate 4/13 sec to clots, fistulogram yesterday per IR followed by HD. 4. ESRD - HD on MWF @ [facility], off schedule after fistulogram, K 4.  Next HD tomorrow. HTN/Volume - BP 140/37 on Carvedilol; wt 63.2 kg s/p net UF 2 L yesterday, below EDW.  5. Anemia - Hgb up to 9.6, on Aranesp 40 mcg on Mon, Fe per HD.  6. Sec HPT - Ca 7.6 (8.5 corrected), P 2.8; 2.5Ca bath, no Hectorol, no binders.  7. Nutrition - Alb 2.9, multivitamin.  8. DM - on insulin.  9. PAD - s/p L fem-pop bypass.   LOS: 10 days   [name] 07/19/2013,9:38 AM I have seen and examined this patient and agree with plan per [name].  SP declot yest .  + bruit in AVF. He is off his usual MWF HD schedule.  Will plan HD in AM.. Antonio SallesMichael T Kimmie Berggren,MD 07/19/2013 10:00 AM

## 2013-07-19 NOTE — Progress Notes (Signed)
Speech Language Pathology Treatment: Dysphagia  Patient Details Name: [name] MRN: [id number] DOB: June 05, 1933 Today's Date: 07/19/2013 Time: [id number] SLP Time Calculation (min): 25 min  Assessment / Plan / Recommendation Clinical Impression  Pt demonstrates improved attention to PO, able to feed himself from breakfast tray with min assist. Continues to talk with puree in mouth, but only briefly with much swifter transit and swallow response, responds to min verbal cues to swallow. No evidence of aspiration seen. Will upgrade to dys 2 (ground) and f/u for tolerance.    HPI HPI: 80 year old man with a history of diabetes peripheral vascular disease hypertension and end-stage renal disease [facility]  With  pulmonary edema.  Troponins were negative and following self extubation, he complained of chest pain or respiratory distress; he ruled in for  non-STEMI. He was reintubated. Catheterization demonstrated normal left ventricular function and severe 3 vessel disease. He was not felt to be a candidate for bypass surgery and was subsequently submitted for PCI of the LAD/RCA  while still intubated. His LVEDP was 22 He underwent DES stenting 4/9;  exttubated yeday   Pertinent Vitals NA  SLP Plan  Continue with current plan of care    Recommendations Diet recommendations: Dysphagia 2 (fine chop);Thin liquid Liquids provided via: Cup;Straw Medication Administration: Whole meds with puree Supervision: Full supervision/cueing for compensatory strategies Compensations: Slow rate;Small sips/bites Postural Changes and/or Swallow Maneuvers: Seated upright 90 degrees;Upright 30-60 min after meal              Plan: Continue with current plan of care    GO    [facility] HospitalBonnie Eliya Bubar, MA CCC-SLP [phone]  Riley NearingBonnie Caroline Jailene Cupit 07/19/2013, 9:00 AM

## 2013-07-19 NOTE — Progress Notes (Signed)
Physical Therapy Treatment Patient Details Name: [name] MRN: [id number] DOB: April 10, 1933 Today's Date: 07/19/2013    History of Present Illness 80 year old man with a history of diabetes peripheral vascular disease hypertension and end-stage renal disease [facility]  With  pulmonary edema.  Troponins were negative and following self extubation, he complained of chest pain or respiratory distress is if we had a non-STEMI.  Pt extubated 4/11.     PT Comments    Working with PT with increased encouragement due to language barrier.  Family arrived in session and pt unable to continue due to distractions.  Still feel he is good candidate for CIR.  Will follow up acutely.  Follow Up Recommendations  CIR     Equipment Recommendations  Rolling walker with 5" wheels    Recommendations for Other Services Rehab consult     Precautions / Restrictions Precautions Precautions: Fall Precaution Comments: limited communication with my broken Spanish till family arrived    Mobility  Bed Mobility Overal bed mobility: Needs Assistance       Supine to sit: Min assist;HOB elevated Sit to supine: Min assist   General bed mobility comments: assist and multimodal cues for patient understanding/participation  Transfers Overall transfer level: Needs assistance Equipment used: Rolling walker (2 wheeled) Transfers: Sit to/from Stand Sit to Stand: Mod assist;+2 safety/equipment         General transfer comment: assist of 2 for safety due to patient with limited understanding due to language barrier  Ambulation/Gait Ambulation/Gait assistance: +2 safety/equipment;Mod assist Ambulation Distance (Feet): 12 Feet Assistive device: Rolling walker (2 wheeled) Gait Pattern/deviations: Step-to pattern;Shuffle;Trunk flexed;Wide base of support     General Gait Details: cues for keeping up inside walker, for forward gaze (out window) and for maintaining upright posture (pt  indicating wants to sit in chair   Stairs            Wheelchair Mobility    Modified Rankin (Stroke Patients Only)       Balance     Sitting balance-Leahy Scale: Fair Sitting balance - Comments: sitting edge of bed with supervision about 45 seconds   Standing balance support: Bilateral upper extremity supported Standing balance-Leahy Scale: Poor Standing balance comment: standing with assist and UE  support for balance                    Cognition Arousal/Alertness: Awake/alert Behavior During Therapy: Impulsive Overall Cognitive Status: Difficult to assess                 General Comments: difficulty following multimodal commands during session; when family arrived, pt stating "I will pay for it if I borrow it."  Question if referring to walker    Exercises      General Comments        Pertinent Vitals/Pain No pain complaints    Home Living                      Prior Function            PT Goals (current goals can now be found in the care plan section) Progress towards PT goals: Progressing toward goals    Frequency  Min 3X/week    PT Plan Current plan remains appropriate    Co-evaluation             End of Session Equipment Utilized During Treatment: Gait belt Activity Tolerance: Patient limited by fatigue Patient left: in chair;with  call bell/phone within reach;with family/visitor present;with nursing/sitter in room     Time: 1056-1120 PT Time Calculation (min): 24 min  Charges:  $Gait Training: 8-22 mins $Therapeutic Activity: 8-22 mins                    G Codes:      Ane PaymentCynthia R Ruthanna Macchia 07/19/2013, 3:15 PM Sheran Lawlessyndi Lizzie An, PT [phone] 07/19/2013

## 2013-07-19 NOTE — Consult Note (Signed)
Physical Medicine and Rehabilitation Consult Reason for Consult: Acute encephalopathy/NSTEMI/end-stage renal disease Referring Physician: [facility]   HPI: [name] is a 80 y.o. right-handed non-English speaking Spanish male with end-stage renal disease and hemodialysis, PVD status post left femoral-popliteal bypass, diabetes mellitus and peripheral neuropathy. Admitted 07/09/2013 from outside hospital after presenting with fever, cough and some and altered mental status. Patient noted to be very hypoxic felt to be secondary to pulmonary edema per chest x-ray and ultimately was intubated. Patient was transferred to [facility] for ongoing evaluation. Noted findings of elevated troponin 9.94 without acute ST elevation or EKG suspect NSTEMI and cardiology services consulted. Underwent cardiac catheterization 07/12/2013 per Dr. [place] showing severe 3 vessel obstructive coronary artery disease and placed on aspirin Plavix therapy. Subcutaneous heparin for DVT prophylaxis. Hemodialysis ongoing as per renal services. Currently maintained on a dysphagia 2 thin liquid diet. Physical therapy evaluation completed 07/16/2013 with recommendations of physical medicine rehabilitation consult.   Review of Systems  Constitutional: Positive for fever.  Respiratory: Positive for cough.   Neurological: Positive for weakness.  All other systems reviewed and are negative.  Past Medical History  Diagnosis Date  . Diabetes mellitus   . Hypertension   . Hyperlipidemia   . Glaucoma   . Cataract   . Peripheral vascular disease   . ESRD on dialysis    Past Surgical History  Procedure Laterality Date  . Pr vein bypass graft,aorto-fem-pop  02/20/2010    left fem pop with saphenous vein  . Av fistula placement  10/24/2009    left   Family History  Problem Relation Age of Onset  . Heart disease Father     Heart Disease before age 25  . Diabetes Father   . Hyperlipidemia Father   .  Hypertension Father   . Hyperlipidemia Mother   . Hypertension Mother   . Heart disease Mother     Heart Disease before age 70  . Diabetes Brother   . Hyperlipidemia Brother   . Hypertension Brother    Social History:  reports that he quit smoking about 37 years ago. He quit smokeless tobacco use about 26 years ago. He reports that he does not drink alcohol or use illicit drugs. Allergies: No Known Allergies Medications Prior to Admission  Medication Sig Dispense Refill  . aspirin 81 MG tablet Take 81 mg by mouth daily.      . carvedilol (COREG) 3.125 MG tablet Take 3.125 mg by mouth See admin instructions. Takes 1 tablet daily after dialysis on Mon, Wed, and Fri.  Take 1 tablet twice a day the rest of the week.      . clopidogrel (PLAVIX) 75 MG tablet Take 75 mg by mouth daily.      Marland Kitchen ezetimibe-simvastatin (VYTORIN) 10-80 MG per tablet Take 0.5 tablets by mouth daily.      . isosorbide mononitrate (IMDUR) 30 MG 24 hr tablet Take 30 mg by mouth daily.       . multivitamin (RENA-VIT) TABS tablet Take 1 tablet by mouth daily.      Marland Kitchen NOVOLOG MIX 70/30 (70-30) 100 UNIT/ML injection Inject 10 Units into the skin Twice daily.         Home: Home Living Family/patient expects to be discharged to:: Private residence Living Arrangements: Spouse/significant other;Children Available Help at Discharge: Family;Other (Comment) (unsure if family can provide 24/7 (A) ) Additional Comments: pt is poor historian with interpreter; no family present   Functional History: Prior  Function Level of Independence: Independent Comments: pt reported he was independent prior to admission;  unsure if pt is accurate  Functional Status:  Mobility: Bed Mobility Overal bed mobility: Needs Assistance Bed Mobility: Supine to Sit;Sit to Supine Supine to sit: Min assist;HOB elevated Sit to supine: Min assist General bed mobility comments: min (A) to elevate trunk to sitting position with HOB elevated; cues for  safety; min (A) to advance LEs onto bed and in supine position  Transfers Overall transfer level: Needs assistance Equipment used: 2 person hand held assist Transfers: Sit to/from Stand Sit to Stand: Mod assist General transfer comment: (A) to maintain balance and acheive upright standing position; pt very impulsive and tolerated standing <30 seconds and flopped back into sitting position on EOB with decreased awareness and decreased ability to control descent  Ambulation/Gait General Gait Details: unable to test today; pt fatigued with transfer and returned to supine position     ADL:    Cognition: Cognition Overall Cognitive Status: Difficult to assess Orientation Level: Oriented to person;Disoriented to place;Disoriented to time;Disoriented to situation Cognition Arousal/Alertness: Awake/alert Behavior During Therapy: Impulsive Overall Cognitive Status: Difficult to assess Area of Impairment: Orientation;Following commands;Safety/judgement;Problem solving;Awareness Orientation Level: Disoriented to;Situation;Place;Time Following Commands: Follows one step commands inconsistently Safety/Judgement: Decreased awareness of deficits;Decreased awareness of safety Problem Solving: Slow processing;Difficulty sequencing;Requires verbal cues;Requires tactile cues General Comments: difficulty following multimodal commands during session  Difficult to assess due to: Non-English speaking  Blood pressure 83/55, pulse 76, temperature 98.1 F (36.7 C), temperature source Oral, resp. rate 18, height 5\' 4"  (1.626 m), weight 139 lb 5.3 oz (63.2 kg), SpO2 99.00%. Physical Exam  Vitals reviewed. HENT:  Head: Normocephalic.  Eyes: EOM are normal.  Neck: Normal range of motion. Neck supple. No thyromegaly present.  Cardiovascular: Normal rate and regular rhythm.   Respiratory: Effort normal and breath sounds normal. No respiratory distress.  GI: Soft. Bowel sounds are normal. He exhibits no  distension.  Neurological: He is alert. No cranial nerve deficit. He exhibits normal muscle tone.  Patient speaks very little english but seems to follow most simple commands with tactile cueing. He made good eye contact with examiner. Fairly alert. Family was at bedside to help translate. Moves all 4's. UE 3+ to 4/5 prox to distal. LE 3/5 HF, KE and 4/5 ankles. Senses pain.   Skin: Skin is warm and dry.  Psychiatric: He has a normal mood and affect.    Results for orders placed during the hospital encounter of 07/09/13 (from the past 24 hour(s))  GLUCOSE, CAPILLARY     Status: Abnormal   Collection Time    07/18/13 12:02 PM      Result Value Ref Range   Glucose-Capillary 177 (*) 70 - 99 mg/dL   Comment 1 Notify RN     Comment 2 Documented in Chart    GLUCOSE, CAPILLARY     Status: Abnormal   Collection Time    07/18/13  5:18 PM      Result Value Ref Range   Glucose-Capillary 138 (*) 70 - 99 mg/dL  CBC     Status: Abnormal   Collection Time    07/18/13  7:00 PM      Result Value Ref Range   WBC 10.3  4.0 - 10.5 K/uL   RBC 2.73 (*) 4.22 - 5.81 MIL/uL   Hemoglobin 8.8 (*) 13.0 - 17.0 g/dL   HCT 27.225.3 (*) 53.639.0 - 64.452.0 %   MCV 92.7  78.0 - 100.0 fL  MCH 32.2  26.0 - 34.0 pg   MCHC 34.8  30.0 - 36.0 g/dL   RDW 04.514.8  40.911.5 - 81.115.5 %   Platelets 270  150 - 400 K/uL  GLUCOSE, CAPILLARY     Status: Abnormal   Collection Time    07/18/13 11:22 PM      Result Value Ref Range   Glucose-Capillary 127 (*) 70 - 99 mg/dL  CBC     Status: Abnormal   Collection Time    07/19/13  5:07 AM      Result Value Ref Range   WBC 9.4  4.0 - 10.5 K/uL   RBC 3.00 (*) 4.22 - 5.81 MIL/uL   Hemoglobin 9.6 (*) 13.0 - 17.0 g/dL   HCT 91.428.5 (*) 78.239.0 - 95.652.0 %   MCV 95.0  78.0 - 100.0 fL   MCH 32.0  26.0 - 34.0 pg   MCHC 33.7  30.0 - 36.0 g/dL   RDW 21.314.9  08.611.5 - 57.815.5 %   Platelets 296  150 - 400 K/uL  GLUCOSE, CAPILLARY     Status: Abnormal   Collection Time    07/19/13  5:19 AM      Result Value Ref  Range   Glucose-Capillary 245 (*) 70 - 99 mg/dL  GLUCOSE, CAPILLARY     Status: Abnormal   Collection Time    07/19/13  8:38 AM      Result Value Ref Range   Glucose-Capillary 210 (*) 70 - 99 mg/dL   Ir Pta Venous Left  4/69/62954/14/2015   CLINICAL DATA:  80 year old with end-stage renal disease and pulling clots during dialysis. The patient has a left upper extremity cephalic vein fistula.  EXAM: LEFT UPPER EXTREMITY FISTULOGRAM; VENOUS ANGIOPLASTY; FISTULA DECLOT  Physician: Rachelle HoraAdam R. Henn, MD  FLUOROSCOPY TIME:  12 min and 24 seconds  MEDICATIONS AND MEDICAL HISTORY: 50 mcg fentanyl.  ANESTHESIA/SEDATION: A radiology nurse monitored the patient throughout the procedure.  PROCEDURE: The patient has an altered mental status and unable to give consent. Informed consent was obtained from the patient's daughter. Angiocath was placed in the left upper arm cephalic vein. Fistulogram images were obtained. Arm was prepped and draped in a sterile fashion. Maximal barrier sterile technique was utilized including caps, mask, sterile gowns, sterile gloves, sterile drape, hand hygiene and skin antiseptic. The skin was anesthetized with 1% lidocaine. Angiocath was eventually exchanged for 6 French vascular sheath over a stiff Amplatz wire. The catheter exchange was difficult due to the stenosis in the cephalic vein. A Bentson wire was advanced centrally. At this point, venogram demonstrated that the stenosis or thrombus was now occlusive. The presumed stenosis was treated with a 6 mm x 40 mm Conquest balloon. Follow venograms demonstrated persistent occlusion at this site. As a result, the patient was given 3000 units heparin through the sheath. An additional 2000 units were given approximately 30 per min later. The focal thrombosis was treated with the Angiojet thrombectomy device. This area was also treated with a 7 mm x 40 mm Conquest balloon. There was persistent occlusion in the left cephalic vein. A 5 French catheter was  advanced into the central veins and a pull-back venogram was obtained. The thrombus had migrated into the upper cephalic vein. The upper cephalic vein was successfully treated with the Angiojet thrombectomy device and treated with the 6 mm balloon. At this point, there was flow throughout the cephalic vein. Follow up venograms were obtained. The vascular sheath was removed with a pursestring suture.  FINDINGS: Initial fistulogram images demonstrated a severe stenosis in the left cephalic vein in the mid humeral region. The central veins and arterial anastomosis were patent. This severe stenosis was related to focal thrombus. Following placement of a catheter and wire, there was thrombosis of the cephalic vein. Ultrasound demonstrated thrombus in the mid humeral region. This area was treated with the Angiojet thrombectomy device. Thrombus did migrate into the more proximal cephalic vein and successful treated with the Angiojet device. Throughout the procedure, the patient appeared to be developing additional thrombus in the cephalic vein and, therefore, the patient was anticoagulated. At the end of procedure, there was improved flow throughout the left cephalic vein but there are was nonocclusive residual thrombus in the mid and lower humeral regions. Central veins are patent.  IMPRESSION: Treatment of focal thrombus in the left cephalic vein. At the end of the procedure, the occlusive thrombus was removed but there was residual nonocclusive thrombus.   Electronically Signed   By: Richarda Overlie M.D.   On: 07/18/2013 18:10    Assessment/Plan: Diagnosis: deconditioning related to multiple medical issues,  encephalopathy 1. Does the need for close, 24 hr/day medical supervision in concert with the patient's rehab needs make it unreasonable for this patient to be served in a less intensive setting? Yes 2. Co-Morbidities requiring supervision/potential complications: ESRD on HD, dm dpn, pvd 3. Due to bladder  management, bowel management, safety, skin/wound care, disease management, medication administration, pain management and patient education, does the patient require 24 hr/day rehab nursing? Yes 4. Does the patient require coordinated care of a physician, rehab nurse, PT (1-2 hrs/day, 5 days/week), OT (1-2 hrs/day, 5 days/week) and SLP (1-2 hrs/day, 5 days/week) to address physical and functional deficits in the context of the above medical diagnosis(es)? Yes Addressing deficits in the following areas: balance, endurance, locomotion, strength, transferring, bowel/bladder control, bathing, dressing, feeding, grooming, toileting, cognition, swallowing and psychosocial support 5. Can the patient actively participate in an intensive therapy program of at least 3 hrs of therapy per day at least 5 days per week? Yes 6. The potential for patient to make measurable gains while on inpatient rehab is excellent 7. Anticipated functional outcomes upon discharge from inpatient rehab are supervision  with PT, supervision with OT, supervision with SLP. 8. Estimated rehab length of stay to reach the above functional goals is: 12-16 days 9. Does the patient have adequate social supports to accommodate these discharge functional goals? Yes and Potentially 10. Anticipated D/C setting: Home 11. Anticipated post D/C treatments: HH therapy and Outpatient therapy 12. Overall Rehab/Functional Prognosis: good  RECOMMENDATIONS: This patient's condition is appropriate for continued rehabilitative care in the following setting: CIR Patient has agreed to participate in recommended program. Potentially Note that insurance prior authorization may be required for reimbursement for recommended care.  Comment: Spoke at length to the daughters who seem to want to pursue CIR after our discussion. Rehab Admissions Coordinator to follow up.  Thanks,  Ranelle Oyster, MD, [place] [facility]     07/19/2013

## 2013-07-19 NOTE — Progress Notes (Signed)
Patient Name: [name] Date of Encounter: 07/19/2013     Principal Problem:   Acute encephalopathy Active Problems:   Peripheral vascular disease, unspecified   ESRD on dialysis   Unspecified essential hypertension   Type II or unspecified type diabetes mellitus with peripheral circulatory disorders, uncontrolled(250.72)   Acute respiratory failure   Non-ST elevation myocardial infarction (NSTEMI), initial episode of care   Patient Profile: 80 yo Spanish-speaking male w/ESRD on HD, PVD and DM was admitted 04/05 w/ SOB, MS changes. ETT 04/05-07 w/ self extubation, re-intubated 04/07-11; NSTEMI w/ cath 04/08, 3V dz but TCTS said too unstable for CABG, PCI LAD and RCA w/ DES x 2 on 04/09; EF 65%   SUBJECTIVE With sitter. He is much more calm when he has company. He has had no chest pain. His only complaint is that he feels cold.  CURRENT MEDS . antiseptic oral rinse  15 mL Mouth Rinse QID  . aspirin  81 mg Per Tube Daily  . atorvastatin  40 mg Oral q1800  . carvedilol  12.5 mg Oral BID WC  . chlorhexidine  15 mL Mouth Rinse BID  . clopidogrel  75 mg Oral Q breakfast  . darbepoetin (ARANESP) injection - DIALYSIS  40 mcg Intravenous Q Mon-HD  . famotidine  20 mg Per Tube Daily  . feeding supplement (NEPRO CARB STEADY)  237 mL Oral BID BM  . feeding supplement (PRO-STAT SUGAR FREE 64)  30 mL Oral BID  . ferric gluconate (FERRLECIT/NULECIT) IV  125 mg Intravenous Q M,W,F-HD  . heparin  5,000 Units Subcutaneous 3 times per day  . insulin aspart  0-9 Units Subcutaneous 6 times per day  . isosorbide mononitrate  30 mg Oral Daily  . metroNIDAZOLE  500 mg Oral 3 times per day  . multivitamin  1 tablet Oral QHS  . risperiDONE  0.5 mg Oral BID    OBJECTIVE  Filed Vitals:   07/18/13 2329 07/19/13 0451 07/19/13 0825 07/19/13 0840  BP: 132/33  151/55 140/37  Pulse: 85  86 105  Temp: 99.8 F (37.7 C)   98.4 F (36.9 C)  TempSrc: Axillary   Oral  Resp: 18   18  Height:        Weight:  139 lb 5.3 oz (63.2 kg)    SpO2: 97%   95%    Intake/Output Summary (Last 24 hours) at 07/19/13 0842 Last data filed at 07/19/13 0452  Gross per 24 hour  Intake    240 ml  Output   1952 ml  Net  -1712 ml   Filed Weights   07/16/13 0454 07/16/13 2152 07/19/13 0451  Weight: 138 lb 14.2 oz (63 kg) 143 lb 15.4 oz (65.3 kg) 139 lb 5.3 oz (63.2 kg)    PHYSICAL EXAM  General: AAOX2; slight confusion  Cardiovascular: S1 and s2, no rubs or gallops; positive SEM  Respiratory: CTA bilaterally  Abdomen: soft, NT, ND, positive BS  Musculoskeletal:no edema, no cyanosis; LUE with AVF (positive bruit)  Accessory Clinical Findings  CBC  Recent Labs  07/18/13 1900 07/19/13 0507  WBC 10.3 9.4  HGB 8.8* 9.6*  HCT 25.3* 28.5*  MCV 92.7 95.0  PLT 270 296   Basic Metabolic Panel  Recent Labs  07/17/13 1240  NA 134*  K 4.0  CL 92*  CO2 22  GLUCOSE 196*  BUN 78*  CREATININE 11.74*  CALCIUM 7.6*    TELE  NSR, couple PVCs. HR 80s  Radiology/Studies  Dg Chest  Port 1 View  07/16/2013   CLINICAL DATA:  Edema  EXAM: PORTABLE CHEST - 1 VIEW  COMPARISON:  07/14/2013  FINDINGS: Right IJ central venous catheter projects over the distal superior vena cava. Mild cardiomegaly is stable. Atherosclerotic calcification thoracic aortic arch. There is diffuse pulmonary vascular congestion and diffuse interstitial prominence. Aeration of the lungs appear similar to 07/14/2013 chest radiograph. No focal consolidation. No definite visible pleural effusion by portable technique. Negative for pneumothorax. Imaged bones are unremarkable.  IMPRESSION: No significant change and mild pulmonary edema pattern.     ASSESSMENT AND PLAN 80 yo Spanish-speaking male w/ESRD on HD, PVD and DM was admitted 04/05 w/ SOB, MS changes. ETT 04/05-07 w/ self extubation, re-intubated 04/07-11; NSTEMI w/ cath 04/08, 3V dz but TCTS said too unstable for CABG, PCI LAD and RCA w/ DES x 2 on 04/09; EF  65%  NSTEMI- severe 3-vessel CAD, s/p DES to RCA and LAD, CFX 90%, OM 1 95%, medical Rx.  -- His Coreg was increased from 6.25 BID to 12.5 mg BID yesterday and he is tolerating it well. BP still a little elevated, but improved. Continue coreg 12.5 BID -- Continue BB, statin, ASA/Plavix, imdur  Otherwise, per IM/Renal teams.   Acute Respiratory failure -resolved. 2/2 pulmonary edema and NSTEMI  Required to intubated shortly, now off ventilatory support and breathing well -will continue HD for volume control   ESRD - HD on MWF   HTN/Volume - continue carvedilol(dose adjusted by cardiology to 12.5 BID) and will continue imdur.   Anemia - Hgb 9.4, on Aranesp 40 mcg on Mon and Fe per HD.   Sec HPT - no Hectorol or binders. Will follow renal recommendations   Nutrition - Alb 2.9, started on dysphagia 1 with thin liquids   DM - continue SSI and low carb diet   PAD - s/p L fem-pop bypass.  -continue ASA and plavix now   Protein calorie malnutrition, moderate: in setting of acute NSTEMI and poor intake  -continue prostat and nepro  -albumin 2.9   acute encephalopathy, mild dementia and confusion:  -patient remains AAOX2, able to follow commands and provide accurate information in his own language  -language barrier with huge impact into current presumed AMS and hospital delirium  -will continue risperdal, but will change to BID. Patient has a quite night and less agitated.   C. Diff colitis: started on flagyl.  -contact precautions ordered.   deconditioning and weakness: will ne SNF at discharge for rehabilitation.   Signed, Thereasa ParkinKathryn Stern PA-C  Pager [phone]  Patient examined chart reviewed  Tolerating higher dose beta blocker  Not much else to add from cardiac  Perspective Will arrange outpatient f/u with Dr Tamala FothergillKelly  [place] Andy C Italia Wolfert

## 2013-07-19 NOTE — Progress Notes (Signed)
[facility] PROGRESS NOTE  [place] FKC:[id number] DOB: Oct 24, 1933 DOA: 07/09/2013 PCP: Maryella Shivers, MD  Assessment/Plan: 1-acute Respiratory failure - sec to pulmonary edema and NSTEMI Required to intubated shortly, now off ventilatory support and breathing good -no SOB, no wheezing and no crackles on exam -will continue HD for volume control  2-NSTEMI - severe 3-vessel CAD, s/p PCI to LAD/RCA with DES 4/8. -cardiology on board, will follow recommendations  -plan is to start coreg BID low dose (12.5 mg BID) -start imdur -continue ASA, plavix and statins  3-ESRD - HD on MWF -patient had trouble with fistula on 4/13; required fistulogram and angiojet therapy -Dialysis for renal  4-HTN/Volume - continue carvedilol(dose adjusted by cardiology to 12.5 BID) and will continue imdur.  5-Anemia - Hgb 9.4, on Aranesp 40 mcg on Mon and Fe per HD.   6-Sec HPT - no Hectorol or binders. Will follow renal recommendations  7-Nutrition - Alb 2.9, started on dysphagia 1 with thin liquids -will start nepro  8-DM - continue SSI and low carb diet  9-PAD - s/p L fem-pop bypass. -continue ASA and plavix now  10-protein calorie malnutrition, moderate: in setting of acute NSTEMI and poor intake -continue prostat and nepro -albumin 2.9  11-acute encephalopathy, mild dementia and confusion:  -patient remains AAOX2, able to follow commands and provide accurate information in his own language  -language barrier with huge impact into current presumed AMS and hospital delirium -will continue risperdal, but will change to BID. Patient has a quite night and less agitated.  12-C. Diff colitis: started on flagyl. -contact precautions ordered.  13-deconditioning and weakness: will need SNF at discharge for rehabilitation.  VCB:[id number]   Code Status: Full Family Communication: caregiver at bedside Disposition Plan: Likely to [facility] CIR when medically  ready   Consultants:  Renal service  Cardiology  PCCM  Procedures:  See below for x-ray reports  Cath with PCI to LAD/RCA 4/8.15 (3 vessels disease but not a candidate for CABG). Normal EF per cath.  Shuntogram today 4/15   Antibiotics:  None   HPI/Subjective: Spanish speaking only; interpreter at bedside assisting-patient states he had 3-4 stools overnight, he denies chest pain and shortness of breath. Intermittent confusion reported per nursing staff.  Objective: Filed Vitals:   07/19/13 1317  BP: 106/35  Pulse: 78  Temp: 98.9 F (37.2 C)  Resp: 18    Intake/Output Summary (Last 24 hours) at 07/19/13 1901 Last data filed at 07/19/13 1319  Gross per 24 hour  Intake    360 ml  Output   1953 ml  Net  -1593 ml   Filed Weights   07/16/13 0454 07/16/13 2152 07/19/13 0451  Weight: 63 kg (138 lb 14.2 oz) 65.3 kg (143 lb 15.4 oz) 63.2 kg (139 lb 5.3 oz)    Exam:   General:  AAOX2; in no apparent distress  Cardiovascular: S1 and s2, no rubs or gallops; positive SEM  Respiratory: CTA bilaterally  Abdomen: soft, NT, ND, positive BS  Musculoskeletal:no edema, no cyanosis; LUE with AVF (positive bruit)  Data Reviewed: Basic Metabolic Panel:  Recent Labs Lab 07/13/13 0500 07/14/13 0500 07/14/13 0700 07/15/13 0500 07/16/13 0400 07/17/13 1240  NA 142 137  --  138 139 134*  K 3.3* 3.5*  --  3.6* 3.7 4.0  CL 96 94*  --  95* 94* 92*  CO2 19 21  --  26 20 22   GLUCOSE 177* 144*  --  190* 173* 196*  BUN 36* 52*  --  43* 62* 78*  CREATININE 5.63* 8.27*  --  6.62* 9.24* 11.74*  CALCIUM 8.1* 7.9*  --  7.9* 8.2* 7.6*  MG  --   --  2.3 2.2  --   --   PHOS  --   --  4.7* 2.8  --   --    Liver Function Tests:  Recent Labs Lab 07/15/13 0500  AST 31  ALT 28  ALKPHOS 110  BILITOT 0.3  PROT 6.5  ALBUMIN 2.9*   CBC:  Recent Labs Lab 07/15/13 0500 07/16/13 0400 07/17/13 1240 07/18/13 1900 07/19/13 0507  WBC 12.3* 14.0* 11.1* 10.3 9.4  HGB 9.1*  9.4* 8.9* 8.8* 9.6*  HCT 27.6* 28.1* 26.0* 25.3* 28.5*  MCV 96.8 96.2 94.2 92.7 95.0  PLT 214 239 254 270 296   Cardiac Enzymes:  Recent Labs Lab 07/13/13 0500  TROPONINI 5.40*   BNP (last 3 results)  Recent Labs  07/10/13 0910 07/13/13 0500  PROBNP 54473.0* 47075.0*   CBG:  Recent Labs Lab 07/18/13 1718 07/18/13 2322 07/19/13 0519 07/19/13 0838 07/19/13 1126  GLUCAP 138* 127* 245* 210* 261*    Recent Results (from the past 240 hour(s))  CULTURE, BLOOD (ROUTINE X 2)     Status: None   Collection Time    07/09/13  7:10 PM      Result Value Ref Range Status   Specimen Description BLOOD RIGHT FOREARM   Final   Special Requests BOTTLES DRAWN AEROBIC ONLY 3CC   Final   Culture  Setup Time     Final   Value: 07/10/2013 02:23     Performed at [facility]   Culture     Final   Value: NO GROWTH 5 DAYS     Performed at [facility]   Report Status 07/16/2013 FINAL   Final  CULTURE, RESPIRATORY (NON-EXPECTORATED)     Status: None   Collection Time    07/09/13  7:20 PM      Result Value Ref Range Status   Specimen Description TRACHEAL ASPIRATE   Final   Special Requests NONE   Final   Gram Stain     Final   Value: FEW WBC PRESENT,BOTH PMN AND MONONUCLEAR     NO SQUAMOUS EPITHELIAL CELLS SEEN     FEW GRAM POSITIVE COCCI IN PAIRS AND CHAINS     Performed at [facility]   Culture     Final   Value: Non-Pathogenic Oropharyngeal-type Flora Isolated.     Performed at [facility]   Report Status 07/12/2013 FINAL   Final  RESPIRATORY VIRUS PANEL     Status: None   Collection Time    07/10/13 12:06 PM      Result Value Ref Range Status   Source - RVPAN TRACHEAL ASPIRATE   Final   Respiratory Syncytial Virus A NOT DETECTED   Final   Respiratory Syncytial Virus B NOT DETECTED   Final   Influenza A NOT DETECTED   Final   Influenza B NOT DETECTED   Final   Parainfluenza 1 NOT DETECTED   Final   Parainfluenza 2 NOT DETECTED   Final    Parainfluenza 3 NOT DETECTED   Final   Metapneumovirus NOT DETECTED   Final   Rhinovirus NOT DETECTED   Final   Adenovirus NOT DETECTED   Final   Influenza A H1 NOT DETECTED   Final   Influenza A H3 NOT DETECTED   Final   Comment: (NOTE)  Normal Reference Range for each Analyte: NOT DETECTED     Testing performed using the Luminex xTAG Respiratory Viral Panel test     kit.     This test was developed and its performance characteristics determined     by [facility]. It has not been cleared or approved by the [place]     Food and Drug Administration. This test is used for clinical purposes.     It should not be regarded as investigational or for research. This     laboratory is certified under the [place] (CLIA) as qualified to perform high complexity     clinical laboratory testing.     Performed at [facility] DIFFICILE BY PCR     Status: Abnormal   Collection Time    07/17/13  6:33 PM      Result Value Ref Range Status   C difficile by pcr POSITIVE (*) NEGATIVE Final   Comment: CRITICAL RESULT CALLED TO, READ BACK BY AND VERIFIED WITH:     Loren Racer RN 11:10 07/18/13 ([email or web address])     Studies: Ir Pta Venous Left  07/18/2013   CLINICAL DATA:  80 year old with end-stage renal disease and pulling clots during dialysis. The patient has a left upper extremity cephalic vein fistula.  EXAM: LEFT UPPER EXTREMITY FISTULOGRAM; VENOUS ANGIOPLASTY; FISTULA DECLOT  Physician: Stephan Minister. Henn, MD  FLUOROSCOPY TIME:  12 min and 24 seconds  MEDICATIONS AND MEDICAL HISTORY: 50 mcg fentanyl.  ANESTHESIA/SEDATION: A radiology nurse monitored the patient throughout the procedure.  PROCEDURE: The patient has an altered mental status and unable to give consent. Informed consent was obtained from the patient's daughter. Angiocath was placed in the left upper arm cephalic vein. Fistulogram images were obtained. Arm was prepped and  draped in a sterile fashion. Maximal barrier sterile technique was utilized including caps, mask, sterile gowns, sterile gloves, sterile drape, hand hygiene and skin antiseptic. The skin was anesthetized with 1% lidocaine. Angiocath was eventually exchanged for 6 French vascular sheath over a stiff Amplatz wire. The catheter exchange was difficult due to the stenosis in the cephalic vein. A Bentson wire was advanced centrally. At this point, venogram demonstrated that the stenosis or thrombus was now occlusive. The presumed stenosis was treated with a 6 mm x 40 mm Conquest balloon. Follow venograms demonstrated persistent occlusion at this site. As a result, the patient was given 3000 units heparin through the sheath. An additional 2000 units were given approximately 30 per min later. The focal thrombosis was treated with the Angiojet thrombectomy device. This area was also treated with a 7 mm x 40 mm Conquest balloon. There was persistent occlusion in the left cephalic vein. A 5 French catheter was advanced into the central veins and a pull-back venogram was obtained. The thrombus had migrated into the upper cephalic vein. The upper cephalic vein was successfully treated with the Angiojet thrombectomy device and treated with the 6 mm balloon. At this point, there was flow throughout the cephalic vein. Follow up venograms were obtained. The vascular sheath was removed with a pursestring suture.  FINDINGS: Initial fistulogram images demonstrated a severe stenosis in the left cephalic vein in the mid humeral region. The central veins and arterial anastomosis were patent. This severe stenosis was related to focal thrombus. Following placement of a catheter and wire, there was thrombosis of the cephalic vein. Ultrasound demonstrated thrombus in the mid humeral region. This area  was treated with the Angiojet thrombectomy device. Thrombus did migrate into the more proximal cephalic vein and successful treated with the  Angiojet device. Throughout the procedure, the patient appeared to be developing additional thrombus in the cephalic vein and, therefore, the patient was anticoagulated. At the end of procedure, there was improved flow throughout the left cephalic vein but there are was nonocclusive residual thrombus in the mid and lower humeral regions. Central veins are patent.  IMPRESSION: Treatment of focal thrombus in the left cephalic vein. At the end of the procedure, the occlusive thrombus was removed but there was residual nonocclusive thrombus.   Electronically Signed   By: Markus Daft M.D.   On: 07/18/2013 18:10    Scheduled Meds: . antiseptic oral rinse  15 mL Mouth Rinse QID  . aspirin  81 mg Per Tube Daily  . atorvastatin  40 mg Oral q1800  . carvedilol  12.5 mg Oral BID WC  . chlorhexidine  15 mL Mouth Rinse BID  . clopidogrel  75 mg Oral Q breakfast  . darbepoetin (ARANESP) injection - DIALYSIS  40 mcg Intravenous Q Mon-HD  . famotidine  20 mg Per Tube Daily  . feeding supplement (NEPRO CARB STEADY)  237 mL Oral BID BM  . feeding supplement (PRO-STAT SUGAR FREE 64)  30 mL Oral BID  . [START ON 07/20/2013] ferric gluconate (FERRLECIT/NULECIT) IV  125 mg Intravenous Q T,Th,Sa-HD  . heparin  5,000 Units Subcutaneous 3 times per day  . insulin aspart  0-9 Units Subcutaneous 6 times per day  . isosorbide mononitrate  30 mg Oral Daily  . metroNIDAZOLE  500 mg Oral 3 times per day  . multivitamin  1 tablet Oral QHS  . risperiDONE  0.5 mg Oral BID   Continuous Infusions: . sodium chloride Stopped (07/16/13 0700)       Time spent: >30 minutes   [facility] Pager [phone]. If 7PM-7AM, please contact night-coverage at [email or web address], password [facility] 07/19/2013, 7:01 PM  LOS: 10 days

## 2013-07-20 ENCOUNTER — Inpatient Hospital Stay (HOSPITAL_COMMUNITY)
Admission: RE | Admit: 2013-07-20 | Discharge: 2013-07-27 | DRG: 945 | Disposition: A | Discharge status: Home Health | Payer: Medicare Other | Source: Intra-hospital | Attending: Physical Medicine & Rehabilitation | Admitting: Physical Medicine & Rehabilitation

## 2013-07-20 DIAGNOSIS — Z8249 Family history of ischemic heart disease and other diseases of the circulatory system: Secondary | ICD-10-CM

## 2013-07-20 DIAGNOSIS — I214 Non-ST elevation (NSTEMI) myocardial infarction: Secondary | ICD-10-CM | POA: Diagnosis present

## 2013-07-20 DIAGNOSIS — I70209 Unspecified atherosclerosis of native arteries of extremities, unspecified extremity: Secondary | ICD-10-CM | POA: Diagnosis present

## 2013-07-20 DIAGNOSIS — Z79899 Other long term (current) drug therapy: Secondary | ICD-10-CM

## 2013-07-20 DIAGNOSIS — J811 Chronic pulmonary edema: Secondary | ICD-10-CM | POA: Diagnosis present

## 2013-07-20 DIAGNOSIS — Z9861 Coronary angioplasty status: Secondary | ICD-10-CM

## 2013-07-20 DIAGNOSIS — R5381 Other malaise: Secondary | ICD-10-CM

## 2013-07-20 DIAGNOSIS — G9340 Encephalopathy, unspecified: Secondary | ICD-10-CM | POA: Diagnosis present

## 2013-07-20 DIAGNOSIS — Z7982 Long term (current) use of aspirin: Secondary | ICD-10-CM

## 2013-07-20 DIAGNOSIS — D631 Anemia in chronic kidney disease: Secondary | ICD-10-CM | POA: Diagnosis present

## 2013-07-20 DIAGNOSIS — Z833 Family history of diabetes mellitus: Secondary | ICD-10-CM

## 2013-07-20 DIAGNOSIS — E1149 Type 2 diabetes mellitus with other diabetic neurological complication: Secondary | ICD-10-CM | POA: Diagnosis present

## 2013-07-20 DIAGNOSIS — Z7902 Long term (current) use of antithrombotics/antiplatelets: Secondary | ICD-10-CM

## 2013-07-20 DIAGNOSIS — Z87891 Personal history of nicotine dependence: Secondary | ICD-10-CM

## 2013-07-20 DIAGNOSIS — N039 Chronic nephritic syndrome with unspecified morphologic changes: Secondary | ICD-10-CM

## 2013-07-20 DIAGNOSIS — Z5189 Encounter for other specified aftercare: Principal | ICD-10-CM

## 2013-07-20 DIAGNOSIS — I2510 Atherosclerotic heart disease of native coronary artery without angina pectoris: Secondary | ICD-10-CM | POA: Diagnosis present

## 2013-07-20 DIAGNOSIS — T40605A Adverse effect of unspecified narcotics, initial encounter: Secondary | ICD-10-CM | POA: Diagnosis present

## 2013-07-20 DIAGNOSIS — A0472 Enterocolitis due to Clostridium difficile, not specified as recurrent: Secondary | ICD-10-CM | POA: Diagnosis present

## 2013-07-20 DIAGNOSIS — E1142 Type 2 diabetes mellitus with diabetic polyneuropathy: Secondary | ICD-10-CM | POA: Diagnosis present

## 2013-07-20 DIAGNOSIS — E785 Hyperlipidemia, unspecified: Secondary | ICD-10-CM | POA: Diagnosis present

## 2013-07-20 DIAGNOSIS — N2581 Secondary hyperparathyroidism of renal origin: Secondary | ICD-10-CM | POA: Diagnosis present

## 2013-07-20 DIAGNOSIS — H409 Unspecified glaucoma: Secondary | ICD-10-CM | POA: Diagnosis present

## 2013-07-20 DIAGNOSIS — N186 End stage renal disease: Secondary | ICD-10-CM | POA: Diagnosis present

## 2013-07-20 DIAGNOSIS — Z992 Dependence on renal dialysis: Secondary | ICD-10-CM

## 2013-07-20 DIAGNOSIS — R4587 Impulsiveness: Secondary | ICD-10-CM | POA: Diagnosis present

## 2013-07-20 DIAGNOSIS — I120 Hypertensive chronic kidney disease with stage 5 chronic kidney disease or end stage renal disease: Secondary | ICD-10-CM | POA: Diagnosis present

## 2013-07-20 LAB — CBC
HCT: 29.1 % — ABNORMAL LOW (ref 39.0–52.0)
HEMATOCRIT: 23.7 % — AB (ref 39.0–52.0)
HEMOGLOBIN: 8 g/dL — AB (ref 13.0–17.0)
HEMOGLOBIN: 9.6 g/dL — AB (ref 13.0–17.0)
MCH: 32 pg (ref 26.0–34.0)
MCH: 31.7 pg (ref 26.0–34.0)
MCHC: 33.8 g/dL (ref 30.0–36.0)
MCHC: 33 g/dL (ref 30.0–36.0)
MCV: 94.8 fL (ref 78.0–100.0)
MCV: 96 fL (ref 78.0–100.0)
Platelets: 295 10*3/uL (ref 150–400)
Platelets: 340 10*3/uL (ref 150–400)
RBC: 2.5 MIL/uL — ABNORMAL LOW (ref 4.22–5.81)
RBC: 3.03 MIL/uL — AB (ref 4.22–5.81)
RDW: 14.5 % (ref 11.5–15.5)
RDW: 14.6 % (ref 11.5–15.5)
WBC: 10 10*3/uL (ref 4.0–10.5)
WBC: 10.9 10*3/uL — ABNORMAL HIGH (ref 4.0–10.5)

## 2013-07-20 LAB — GLUCOSE, CAPILLARY
GLUCOSE-CAPILLARY: 168 mg/dL — AB (ref 70–99)
GLUCOSE-CAPILLARY: 122 mg/dL — AB (ref 70–99)
GLUCOSE-CAPILLARY: 194 mg/dL — AB (ref 70–99)
Glucose-Capillary: 198 mg/dL — ABNORMAL HIGH (ref 70–99)
Glucose-Capillary: 188 mg/dL — ABNORMAL HIGH (ref 70–99)

## 2013-07-20 LAB — RENAL FUNCTION PANEL
ALBUMIN: 2.8 g/dL — AB (ref 3.5–5.2)
BUN: 70 mg/dL — AB (ref 6–23)
CO2: 22 mEq/L (ref 19–32)
CREATININE: 9.47 mg/dL — AB (ref 0.50–1.35)
Calcium: 7.7 mg/dL — ABNORMAL LOW (ref 8.4–10.5)
Chloride: 90 mEq/L — ABNORMAL LOW (ref 96–112)
GFR calc Af Amer: 5 mL/min — ABNORMAL LOW (ref >90)
GFR calc non Af Amer: 5 mL/min — ABNORMAL LOW (ref >90)
GLUCOSE: 239 mg/dL — AB (ref 70–99)
POTASSIUM: 3.9 meq/L (ref 3.7–5.3)
Phosphorus: 3.6 mg/dL (ref 2.3–4.6)
Sodium: 134 mEq/L — ABNORMAL LOW (ref 137–147)

## 2013-07-20 LAB — CREATININE, SERUM
CREATININE: 5.09 mg/dL — AB (ref 0.50–1.35)
GFR calc non Af Amer: 10 mL/min — ABNORMAL LOW (ref >90)
GFR, EST AFRICAN AMERICAN: 11 mL/min — AB (ref >90)

## 2013-07-20 MED ORDER — CHLORHEXIDINE GLUCONATE 0.12 % MT SOLN
15.0000 mL | Freq: Two times a day (BID) | OROMUCOSAL | Status: DC
Start: 2013-07-20 — End: 2013-07-27
  Administered 2013-07-20 – 2013-07-27 (×14): 15 mL via OROMUCOSAL
  Filled 2013-07-20 (×16): qty 15

## 2013-07-20 MED ORDER — NEPRO/CARBSTEADY PO LIQD
237.0000 mL | Freq: Two times a day (BID) | ORAL | Status: DC
  Administered 2013-07-21 – 2013-07-26 (×10): 237 mL via ORAL

## 2013-07-20 MED ORDER — ISOSORBIDE MONONITRATE ER 30 MG PO TB24
30.0000 mg | ORAL_TABLET | Freq: Every day | ORAL | Status: DC
  Administered 2013-07-21 – 2013-07-27 (×6): 30 mg via ORAL
  Filled 2013-07-20 (×8): qty 1

## 2013-07-20 MED ORDER — NEPRO/CARBSTEADY PO LIQD: 237.0000 mL | ORAL | Status: DC | PRN

## 2013-07-20 MED ORDER — CARVEDILOL 12.5 MG PO TABS
12.5000 mg | ORAL_TABLET | Freq: Two times a day (BID) | ORAL | Status: DC
  Administered 2013-07-21 – 2013-07-27 (×11): 12.5 mg via ORAL
  Filled 2013-07-20 (×15): qty 1

## 2013-07-20 MED ORDER — FAMOTIDINE 20 MG PO TABS: 20.0000 mg | ORAL_TABLET | Freq: Every day | ORAL | Status: DC

## 2013-07-20 MED ORDER — HEPARIN SODIUM (PORCINE) 1000 UNIT/ML DIALYSIS: 1000.0000 [IU] | INTRAMUSCULAR | Status: DC | PRN

## 2013-07-20 MED ORDER — BIOTENE DRY MOUTH MT LIQD
15.0000 mL | Freq: Four times a day (QID) | OROMUCOSAL | Status: DC
  Administered 2013-07-21 – 2013-07-27 (×18): 15 mL via OROMUCOSAL

## 2013-07-20 MED ORDER — RISPERIDONE 0.5 MG PO TABS: 0.5000 mg | ORAL_TABLET | Freq: Two times a day (BID) | ORAL | Status: DC

## 2013-07-20 MED ORDER — SODIUM CHLORIDE 0.9 % IV SOLN: 100.0000 mL | INTRAVENOUS | Status: DC | PRN

## 2013-07-20 MED ORDER — LIDOCAINE HCL (PF) 1 % IJ SOLN: 5.0000 mL | INTRAMUSCULAR | Status: DC | PRN

## 2013-07-20 MED ORDER — ACETAMINOPHEN 325 MG PO TABS
325.0000 mg | ORAL_TABLET | ORAL | Status: DC | PRN
  Administered 2013-07-21 – 2013-07-23 (×3): 650 mg via ORAL
  Filled 2013-07-20 (×3): qty 2

## 2013-07-20 MED ORDER — ISOSORBIDE MONONITRATE ER 30 MG PO TB24: 30.0000 mg | ORAL_TABLET | Freq: Every day | ORAL | Status: DC

## 2013-07-20 MED ORDER — ALTEPLASE 2 MG IJ SOLR
2.0000 mg | Freq: Once | INTRAMUSCULAR | Status: DC | PRN
  Filled 2013-07-20: qty 2

## 2013-07-20 MED ORDER — FAMOTIDINE 20 MG PO TABS
20.0000 mg | ORAL_TABLET | Freq: Every day | ORAL | Status: DC
  Administered 2013-07-21 – 2013-07-27 (×7): 20 mg
  Filled 2013-07-20 (×8): qty 1

## 2013-07-20 MED ORDER — ONDANSETRON HCL 4 MG PO TABS: 4.0000 mg | ORAL_TABLET | Freq: Four times a day (QID) | ORAL | Status: DC | PRN

## 2013-07-20 MED ORDER — HEPARIN SODIUM (PORCINE) 1000 UNIT/ML DIALYSIS
2000.0000 [IU] | Freq: Once | INTRAMUSCULAR | Status: AC
  Administered 2013-07-20: 2000 [IU] via INTRAVENOUS_CENTRAL

## 2013-07-20 MED ORDER — HEPARIN SODIUM (PORCINE) 5000 UNIT/ML IJ SOLN
5000.0000 [IU] | Freq: Three times a day (TID) | INTRAMUSCULAR | Status: DC
  Administered 2013-07-20 – 2013-07-27 (×20): 5000 [IU] via SUBCUTANEOUS
  Filled 2013-07-20 (×23): qty 1

## 2013-07-20 MED ORDER — CLOPIDOGREL BISULFATE 75 MG PO TABS
75.0000 mg | ORAL_TABLET | Freq: Every day | ORAL | Status: DC
  Administered 2013-07-21 – 2013-07-27 (×7): 75 mg via ORAL
  Filled 2013-07-20 (×8): qty 1

## 2013-07-20 MED ORDER — LIDOCAINE-PRILOCAINE 2.5-2.5 % EX CREA: 1.0000 "application " | TOPICAL_CREAM | CUTANEOUS | Status: DC | PRN

## 2013-07-20 MED ORDER — PRO-STAT SUGAR FREE PO LIQD: 30.0000 mL | Freq: Two times a day (BID) | ORAL | Status: DC

## 2013-07-20 MED ORDER — RENA-VITE PO TABS
1.0000 | ORAL_TABLET | Freq: Every day | ORAL | Status: DC
  Administered 2013-07-21 – 2013-07-26 (×5): 1 via ORAL
  Filled 2013-07-20 (×9): qty 1

## 2013-07-20 MED ORDER — SODIUM CHLORIDE 0.9 % IV SOLN: 125.0000 mg | INTRAVENOUS | Status: AC

## 2013-07-20 MED ORDER — METRONIDAZOLE 500 MG PO TABS
500.0000 mg | ORAL_TABLET | Freq: Three times a day (TID) | ORAL | Status: DC
  Administered 2013-07-20 – 2013-07-27 (×19): 500 mg via ORAL
  Filled 2013-07-20 (×23): qty 1

## 2013-07-20 MED ORDER — INSULIN ASPART 100 UNIT/ML ~~LOC~~ SOLN: 0.0000 [IU] | SUBCUTANEOUS | Status: DC

## 2013-07-20 MED ORDER — PENTAFLUOROPROP-TETRAFLUOROETH EX AERO: 1.0000 "application " | INHALATION_SPRAY | CUTANEOUS | Status: DC | PRN

## 2013-07-20 MED ORDER — HEPARIN SODIUM (PORCINE) 5000 UNIT/ML IJ SOLN: 5000.0000 [IU] | Freq: Three times a day (TID) | INTRAMUSCULAR | Status: DC

## 2013-07-20 MED ORDER — INSULIN ASPART PROT & ASPART (70-30 MIX) 100 UNIT/ML ~~LOC~~ SUSP: 8.0000 [IU] | Freq: Two times a day (BID) | SUBCUTANEOUS | Status: DC

## 2013-07-20 MED ORDER — NEPRO/CARBSTEADY PO LIQD: 237.0000 mL | Freq: Two times a day (BID) | ORAL | Status: DC

## 2013-07-20 MED ORDER — ATORVASTATIN CALCIUM 40 MG PO TABS
40.0000 mg | ORAL_TABLET | Freq: Every day | ORAL | Status: DC
  Administered 2013-07-21 – 2013-07-26 (×5): 40 mg via ORAL
  Filled 2013-07-20 (×7): qty 1

## 2013-07-20 MED ORDER — ONDANSETRON HCL 4 MG/2ML IJ SOLN: 4.0000 mg | Freq: Four times a day (QID) | INTRAMUSCULAR | Status: DC | PRN

## 2013-07-20 MED ORDER — DARBEPOETIN ALFA-POLYSORBATE 40 MCG/0.4ML IJ SOLN: 40.0000 ug | INTRAMUSCULAR | Status: AC

## 2013-07-20 MED ORDER — INSULIN ASPART 100 UNIT/ML ~~LOC~~ SOLN
0.0000 [IU] | SUBCUTANEOUS | Status: DC
  Administered 2013-07-20 – 2013-07-21 (×4): 2 [IU] via SUBCUTANEOUS
  Administered 2013-07-21 (×2): 3 [IU] via SUBCUTANEOUS
  Administered 2013-07-21: 2 [IU] via SUBCUTANEOUS
  Administered 2013-07-22: 3 [IU] via SUBCUTANEOUS
  Administered 2013-07-22 (×2): 2 [IU] via SUBCUTANEOUS

## 2013-07-20 MED ORDER — ASPIRIN 81 MG PO CHEW
81.0000 mg | CHEWABLE_TABLET | Freq: Every day | ORAL | Status: DC
  Administered 2013-07-21 – 2013-07-27 (×7): 81 mg
  Filled 2013-07-20 (×7): qty 1

## 2013-07-20 MED ORDER — RISPERIDONE 0.5 MG PO TBDP
0.5000 mg | ORAL_TABLET | Freq: Once | ORAL | Status: AC
  Administered 2013-07-20: 0.5 mg via ORAL
  Filled 2013-07-20: qty 1

## 2013-07-20 MED ORDER — DARBEPOETIN ALFA-POLYSORBATE 40 MCG/0.4ML IJ SOLN
40.0000 ug | INTRAMUSCULAR | Status: DC
  Filled 2013-07-20 (×2): qty 0.4

## 2013-07-20 MED ORDER — CARVEDILOL 12.5 MG PO TABS
12.5000 mg | ORAL_TABLET | Freq: Two times a day (BID) | ORAL | Status: DC
Start: 2013-07-20 — End: 2013-07-27

## 2013-07-20 MED ORDER — METRONIDAZOLE 500 MG PO TABS: 500.0000 mg | ORAL_TABLET | Freq: Three times a day (TID) | ORAL | Status: DC

## 2013-07-20 MED ORDER — SORBITOL 70 % SOLN: 30.0000 mL | Freq: Every day | Status: DC | PRN

## 2013-07-20 MED ORDER — RISPERIDONE 0.5 MG PO TABS
0.5000 mg | ORAL_TABLET | Freq: Two times a day (BID) | ORAL | Status: DC
  Administered 2013-07-20 – 2013-07-25 (×10): 0.5 mg via ORAL
  Filled 2013-07-20 (×12): qty 1

## 2013-07-20 NOTE — Progress Notes (Signed)
Report called to Kennyth ArnoldStacy, RN; all questions answered.

## 2013-07-20 NOTE — Progress Notes (Signed)
O2 saturation on left hand reads 76% room air. Right hand reads 100% room air. MD made aware. [name] MethJanice J Donell [name]

## 2013-07-20 NOTE — Progress Notes (Signed)
Speech Pathology Cancellation Note Pt in HD, will plan for diet check tomorrow.  Harlon DittyBonnie Aftan Vint, MA CCC-SLP [phone]

## 2013-07-20 NOTE — PMR Pre-admission (Signed)
PMR Admission Coordinator Pre-Admission Assessment  Patient: [name] is an 80 y.o., male MRN: [id number] DOB: 1933/12/13 Height: 5\' 4"  (162.6 cm) Weight: 60.9 kg (134 lb 4.2 oz)              Insurance Information HMO:      PPO:       PCP:       IPA:       80/20:       OTHER:   PRIMARY: Medicare A/B      Policy#: [id number]      Subscriber: Alyn Sharlyne Cai  CM Name:        Phone#:       Fax#:   Pre-Cert#:        Employer: Retired Benefits:  Phone #:       Name: Checked in [place]. Date: 01/04/01     Deduct: $1260      Out of Pocket Max: none      Life Max: unlimited CIR: 100%      SNF: 100 days Outpatient: 80%     Co-Pay: 20% Home Health: 100%      Co-Pay: none DME: 80%     Co-Pay: 20% Providers: patient's choice  SECONDARY: Medicaid      Policy#: [id number]      Subscriber: [place] Sharlyne Cai CM Name:        Phone#:       Fax#:   Pre-Cert#:        Employer: Retired Benefits:  Phone #:  [phone]     Name: Automated Eff. Date: Verified 07/20/2013     Deduct:        Out of Pocket Max:        Life Max:   CIR:        SNF:   Outpatient:       Co-Pay:   Home Health:        Co-Pay:   DME:       Co-Pay:     Emergency Contact Information Contact Information   Name Relation Home Work Mobile   [place] Daughter [phone]       Current Medical History  Patient Admitting Diagnosis: Deconditioning related to multiple medical issues, encephalopathy   History of Present Illness: A 80 y.o. right-handed non-English speaking Spanish male with end-stage renal disease and hemodialysis, PVD status post left femoral-popliteal bypass, diabetes mellitus and peripheral neuropathy. Admitted 07/09/2013 from outside hospital after presenting with fever, cough and some and altered mental status. Patient noted to be very hypoxic felt to be secondary to pulmonary edema per chest x-ray and ultimately was intubated. Patient was transferred to [facility] for ongoing evaluation.  Noted findings of elevated troponin 9.94 without acute ST elevation or EKG suspect NSTEMI and cardiology services consulted. Underwent cardiac catheterization 07/12/2013 per Dr. [place] showing severe 3 vessel obstructive coronary artery disease and placed on aspirin Plavix therapy. Subcutaneous heparin for DVT prophylaxis. Hemodialysis ongoing as per renal services. Currently maintained on a dysphagia 2 thin liquid diet. Physical therapy evaluation completed 07/16/2013 with recommendations of physical medicine rehabilitation consult.   Past Medical History  Past Medical History  Diagnosis Date  . Diabetes mellitus   . Hypertension   . Hyperlipidemia   . Glaucoma   . Cataract   . Peripheral vascular disease   . ESRD on dialysis     Family History  family history includes Diabetes in his brother and father; Heart disease in his father and mother; Hyperlipidemia in  his brother, father, and mother; Hypertension in his brother, father, and mother.  Prior Rehab/Hospitalizations:  None   Current Medications  Current facility-administered medications:0.9 %  sodium chloride infusion, 250 mL, Intravenous, PRN, Neema K Sharda, MD;  0.9 %  sodium chloride infusion, , Intravenous, Continuous, Kalman ShanMurali Ramaswamy, MD;  0.9 %  sodium chloride infusion, 100 mL, Intravenous, PRN, Maree Krabbeobert D Schertz, MD;  antiseptic oral rinse (BIOTENE) solution 15 mL, 15 mL, Mouth Rinse, QID, Kalman ShanMurali Ramaswamy, MD, 15 mL at 07/20/13 1200 aspirin chewable tablet 81 mg, 81 mg, Per Tube, Daily, Nelda Bucksaniel J Feinstein, MD, 81 mg at 07/20/13 1000;  atorvastatin (LIPITOR) tablet 40 mg, 40 mg, Oral, q1800, Lorretta HarpXilin Niu, MD, 40 mg at 07/19/13 1815;  carvedilol (COREG) tablet 12.5 mg, 12.5 mg, Oral, BID WC, Rhonda G Barrett, PA-C, 12.5 mg at 07/20/13 1145;  chlorhexidine (PERIDEX) 0.12 % solution 15 mL, 15 mL, Mouth Rinse, BID, Kalman ShanMurali Ramaswamy, MD, 15 mL at 07/20/13 1146 clopidogrel (PLAVIX) tablet 75 mg, 75 mg, Oral, Q breakfast, Lorretta HarpXilin Niu, MD,  75 mg at 07/20/13 0800;  darbepoetin (ARANESP) injection 40 mcg, 40 mcg, Intravenous, Q Mon-HD, Maree Krabbeobert D Schertz, MD, 40 mcg at 07/10/13 1732;  dextrose 50 % solution 50 mL, 50 mL, Intravenous, PRN, Lorretta HarpXilin Niu, MD;  famotidine (PEPCID) tablet 20 mg, 20 mg, Per Tube, Daily, Anabel BeneSendra Yang, RPH, 20 mg at 07/20/13 1133 feeding supplement (NEPRO CARB STEADY) liquid 237 mL, 237 mL, Oral, BID BM, Vassie Lollarlos Madera, MD, 237 mL at 07/19/13 1452;  feeding supplement (PRO-STAT SUGAR FREE 64) liquid 30 mL, 30 mL, Oral, BID, Haynes BastSamantha J Worley, RD, 30 mL at 07/20/13 1043;  ferric gluconate (NULECIT) 125 mg in sodium chloride 0.9 % 100 mL IVPB, 125 mg, Intravenous, Q T,Th,Sa-HD, Maree Krabbeobert D Schertz, MD, 125 mg at 07/20/13 0944 heparin injection 1,000 Units, 1,000 Units, Dialysis, PRN, Maree Krabbeobert D Schertz, MD;  heparin injection 5,000 Units, 5,000 Units, Subcutaneous, 3 times per day, [name], MD, 5,000 Units at 07/20/13 1411;  insulin aspart (novoLOG) injection 0-9 Units, 0-9 Units, Subcutaneous, 6 times per day, Nelda Bucksaniel J Feinstein, MD, 1 Units at 07/20/13 1147 isosorbide mononitrate (IMDUR) 24 hr tablet 30 mg, 30 mg, Oral, Daily, Vassie Lollarlos Madera, MD, 30 mg at 07/20/13 1144;  lidocaine (PF) (XYLOCAINE) 1 % injection 5 mL, 5 mL, Intradermal, PRN, Maree Krabbeobert D Schertz, MD;  lidocaine-prilocaine (EMLA) cream 1 application, 1 application, Topical, PRN, Maree Krabbeobert D Schertz, MD;  metroNIDAZOLE (FLAGYL) tablet 500 mg, 500 mg, Oral, 3 times per day, Vassie Lollarlos Madera, MD, 500 mg at 07/20/13 1411 morphine 2 MG/ML injection 2 mg, 2 mg, Intravenous, Q2H PRN, Lorretta HarpXilin Niu, MD, 2 mg at 07/16/13 0019;  multivitamin (RENA-VIT) tablet 1 tablet, 1 tablet, Oral, QHS, Dyke MaesMichael T Mattingly, MD, 1 tablet at 07/19/13 2324;  pentafluoroprop-tetrafluoroeth (GEBAUERS) aerosol 1 application, 1 application, Topical, PRN, Maree Krabbeobert D Schertz, MD;  risperiDONE (RISPERDAL) tablet 0.5 mg, 0.5 mg, Oral, BID, Vassie Lollarlos Madera, MD, 0.5 mg at 07/20/13 1000  Patients Current Diet:  Dysphagia  Precautions / Restrictions Precautions Precautions: Fall Precaution Comments: limited communication with my broken Spanish till family arrived Restrictions Weight Bearing Restrictions: No   Prior Activity Level Community (5-7x/wk): 3-4 X a week  Journalist, newspaperHome Assistive Devices / Equipment Home Assistive Devices/Equipment: None  Prior Functional Level Prior Function Level of Independence: Independent Comments: pt reported he was independent prior to admission;  unsure if pt is accurate   Current Functional Level Cognition  Overall Cognitive Status: Impaired/Different from baseline Difficult to assess due to:  Non-English speaking Current Attention Level: Focused Orientation Level: Oriented to person;Disoriented to time;Disoriented to situation;Oriented to place Following Commands: Follows one step commands inconsistently Safety/Judgement: Decreased awareness of deficits;Decreased awareness of safety General Comments: difficulty following commands for bed mobility, gait and RW use    Extremity Assessment (includes Sensation/Coordination)          ADLs  Anticipate ADL needs.  Evaluation pending.    Mobility  Overal bed mobility: Needs Assistance Bed Mobility: Supine to Sit Supine to sit: Supervision Sit to supine: Min assist General bed mobility comments: multimodal cues and pt repeatedly pulling covers back over himself even though stated he was aware of cues to get to EOB x 4 attempts before pt able to complete task with multimodal cues    Transfers  Overall transfer level: Needs assistance Equipment used: Rolling walker (2 wheeled) Transfers: Sit to/from Stand Sit to Stand: Min assist General transfer comment: cues for hand placement and safety with pt not backing fully to surface and sitting prematurely    Ambulation / Gait / Stairs / Wheelchair Mobility  Ambulation/Gait Ambulation/Gait assistance: ArchitectMin assist Ambulation Distance (Feet): 90 Feet Assistive  device: Rolling walker (2 wheeled);None Gait Pattern/deviations: Step-through pattern;Decreased stride length;Leaning posteriorly Gait velocity interpretation: Below normal speed for age/gender General Gait Details: with use of RW pt kept stepping too posterior and pushing RW toward right with max directional cues and difficulty maintaining head up. Without RW pt with posterior left lean with min assist for balance and unable to correct    Posture / Balance Dynamic Sitting Balance Sitting balance - Comments: sitting edge of bed with supervision about 45 seconds    Special needs/care consideration BiPAP/CPAP No CPM No Continuous Drip IV No  Dialysis Yes        Days M-W-F Life Vest No Oxygen No Special Bed No Trach Size No Wound Vac (area) No      Skin Dry skin on feel                              Bowel mgmt: Had BM X 3 today, 07/20/13 Bladder mgmt: Urinated small amounts, incontinent at times. Diabetic mgmt Yes, on insulin at home    Previous Home Environment Living Arrangements: Spouse/significant other;Children Available Help at Discharge: Family;Other (Comment) (unsure if family can provide 24/7 (A) ) Home Care Services: No Additional Comments: pt is poor historian with interpreter; no family present   Discharge Living Setting Plans for Discharge Living Setting: Alone;Apartment Type of Home at Discharge: Apartment Discharge Home Layout: One level Discharge Home Access: Stairs to enter Entrance Stairs-Number of Steps: about 20 steps to 2nd level apartment. Does the patient have any problems obtaining your medications?: No  Social/Family/Support Systems Patient Roles: Parent (Has 5 children.) Contact Information: Corinna CapraJessica Pettry - [place] in [place] ([phone]) [phone] Anticipated Caregiver: Marjory SneddonMaria Alfaro - friend/caregiver Anticipated Caregiver's Contact Information: Byrd HesselbachMaria (c) [phone] Ability/Limitations of Caregiver: Caregiver available as needed previously Caregiver  Availability: Other (Comment) (Family understands patient likely to need 24 hr supervision ) Discharge Plan Discussed with Primary Caregiver: Yes Is Caregiver In Agreement with Plan?: Yes Does Caregiver/Family have Issues with Lodging/Transportation while Pt is in Rehab?: No  Goals/Additional Needs Patient/Family Goal for Rehab: PT/OT/ST Supervisions goals Expected length of stay: 12-16 days Cultural Considerations: Spanish speaking, Catholic religeon Dietary Needs: Dys 2, thin liquids Equipment Needs: TBD Pt/Family Agrees to Admission and willing to participate: Yes Program Orientation Provided & Reviewed with  Pt/Caregiver Including Roles  & Responsibilities: Yes  Decrease burden of Care through IP rehab admission: N/A  Possible need for SNF placement upon discharge: Not planned.  But, if family cannot plan for supervision after rehab, could need SNF after rehab.  Patient Condition: This patient's condition remains as documented in the consult dated 07/20/13, in which the Rehabilitation Physician determined and documented that the patient's condition is appropriate for intensive rehabilitative care in an inpatient rehabilitation facility. Will admit to inpatient rehab today.  Preadmission Screen Completed By:  Trish Mage, 07/20/2013 4:00 PM ______________________________________________________________________   Discussed status with Dr. Wynn Banker on 07/20/13 at 1623 and received telephone approval for admission today.  Admission Coordinator:  Trish Mage, time1623/Date04/16/15

## 2013-07-20 NOTE — Progress Notes (Signed)
Patient received from 6E via bed with RN and family at bedside. Bed alarm on, side rails X3, and call bell at bedside. Sitter at bedside for safety.

## 2013-07-20 NOTE — Discharge Summary (Signed)
Physician Discharge Summary  [place] ZOX:[id number] DOB: [birth year] DOA: 07/09/2013  PCP: Charlott Rakes, MD  Admit date: 07/09/2013 Discharge date: 07/20/2013  Time spent: >96minutes  Recommendations for Outpatient Follow-up:  -Transferred to CIR for rehab -Follow up out pt with cardiology upon discharge from [facility] Inpatient rehabilitation-Dr Tresa Endo, cards to arrange outpt follow up  Discharge Diagnoses:  Principal Problem:   Acute encephalopathy Active Problems:   Peripheral vascular disease, unspecified   ESRD on dialysis   Unspecified essential hypertension   Type II or unspecified type diabetes mellitus with peripheral circulatory disorders, uncontrolled(250.72)   Acute respiratory failure   Non-ST elevation myocardial infarction (NSTEMI), initial episode of care   Discharge Condition: improved/stable  Diet recommendation: Renal/ modified carb  Filed Weights   07/20/13 0600 07/20/13 0700 07/20/13 1038  Weight: 63.9 kg (140 lb 14 oz) 62.8 kg (138 lb 7.2 oz) 60.9 kg (134 lb 4.2 oz)    History of present illness:  Patient is a 80 yo man with acute onset SOB with history of ESRD on HD (MWF), PVD (s/p left fempop) & DM on insulin. Patient mumbles responses to questions, minimally responsive. Per [facility] chart review, patient has been without fever and cough.  Dr. Marchelle Gearing discussed patient with his friend Marjory Sneddon, [phone])>> patient called Byrd Hesselbach on the a.m. of admission complaining of abdominal pain. When Byrd Hesselbach arrived at patient's house, he appeared SOB and they left for [facility]. He had a similar episode of SOB in December at [facility], told it was not HF, and thought there was something wrong with his lungs - cannot offer further details. Byrd Hesselbach also noted that patient is constantly sleepy since Dec. He has had similar SOB prior to Dec, thought to be due to heart failure.  It was noted that patient lives alone. He has 5 children, but only speaks with 2,  a daughter in [place], [phone] - primary contact) and son in [place].     Hospital Course:  SIGNIFICANT EVENTS / STUDIES:  07/09/13- CXR in [facility]: B/l pulm edema (CD with image in chart)  4/5-CXR: underlying congestive heart failure, airspace consolidation in both bases.  4/6-CXR: worsening of airspace disease right lung base.  4/6- Did HD  4/7-CXR: No significant change of airspace disease  4/7-had chest pain and leleved trop-->started heparin gtt  4/7-reintubated, chest pain, pos trop, NSTEMI  4/8-R and L cardiac cath-->three vessel disease. Cardiovascular surgeon consulted-->no CABG due to multiple comorbidities.  4/9-PCI of mid LAD ~80-90-->DES; proximal RCA ~80% lesion-->DES. Elevated LVEDP of 22 mmHG  1-acute Respiratory failure - sec to pulmonary edema and NSTEMI  As discussed above, and he was found to be in acute hypoxemic respiratory failure and was initially placed on BiPAP, CCM asked to admit>> patient Required  intubation/ventilatory support managed per CCM following extubation he was transferred to  . -[facility] hospitalist service. Renal continue to follow and dialyze him for volume control. - his respiratory status has remained stable the rest of his hospital stay post extubation and he has been oxygenating well.   2-NSTEMI  -Cardiology was consulted and followed patient -He had a cardiac cath which showed severe 3-vessel CAD, s/p PCI to LAD/RCA with DES 4/8.  -he had previously been on a low dose of  Coreg, cardiology followed and increased it to 12.5 mg BID which arm he is to continue on discharge. He was also placed on imdur and continued on ASA, plavix and statins  -Cardiology followed and signed off and recommend that he  follows up with Dr. Tresa Endo upon discharge.  3-ESRD - HD on MWF  4.Anemia - Hgb 9.4, on Aranesp 40 mcg on Mon and Fe per HD.  5-HTN/Volume - continue carvedilol dose adjusted by cardiology to 12.5 BID as above and will continue  imdur. 6-Sec HPT - no Hectorol or binders. Will follow renal recommendations  7-Nutrition - Alb 2.9, started on dysphagia 1 with thin liquids  -continue nepro  8-DM - And his Accu-Cheks were monitored and he was on the sliding scale insulin. He has been tolerating by mouth and is to resume his 7030 upon transfer to CIR with continued sliding scale coverage.  9-PAD - s/p L fem-pop bypass.  -continue ASA and plavix as above  10-protein calorie malnutrition, moderate: in setting of acute NSTEMI and poor intake  -continue prostat and nepro  -albumin 2.9  11-acute encephalopathy, mild dementia and confusion:  -patient remains AAOX2, able to follow commands and provide accurate information in his own language  -language barrier with huge impact into current presumed AMS and hospital delirium  -will continue risperdal, but will change to BID. Patient has a quite night and less agitated.  12-C. Diff colitis: started on flagyl.  -contact precautions ordered.  13-deconditioning and weakness: PT saw patient and recommended inpatient rehabilitation -[facility] inpatient rehabilitation saw patient and he'll be transferred there for further rehabilitation  Consultants:  Renal service  Cardiology  PCCM Procedures:  LINES / TUBES:  -ETT 07/09/13 >>>4/7 AM(self extubated)  -Re-ETT 4/7 -4/7 Rt IJ  -Cath with PCI to LAD/RCA 4/8.15 (3 vessels disease but not a candidate for CABG). Normal EF per cath.  -Shuntogram today 4/15   Discharge Exam: Filed Vitals:   07/20/13 1649  BP: 103/32  Pulse: 71  Temp: 99.1 F (37.3 C)  Resp: 16   Exam:  General: AAOX2; in no apparent distress  Cardiovascular: S1 and s2, no rubs or gallops; positive SEM  Respiratory: CTA bilaterally  Abdomen: soft, NT, ND, positive BS  Musculoskeletal:no edema, no cyanosis; LUE with AVF (positive bruit)    Discharge Instructions You were cared for by a hospitalist during your hospital stay. If you have any questions about your  discharge medications or the care you received while you were in the hospital after you are discharged, you can call the unit and asked to speak with the hospitalist on call if the hospitalist that took care of you is not available. Once you are discharged, your primary care physician will handle any further medical issues. Please note that NO REFILLS for any discharge medications will be authorized once you are discharged, as it is imperative that you return to your primary care physician (or establish a relationship with a primary care physician if you do not have one) for your aftercare needs so that they can reassess your need for medications and monitor your lab values.  Discharge Orders   Future Appointments Provider Department Dept Phone   07/21/2013 7:30 AM Donzetta Kohut, OT [facility] [phone] [facility] CENTER A [phone]   07/21/2013 8:30 AM Jackey Loge, PT [facility] CENTER A [phone]   07/21/2013 10:15 AM Maxcine Ham, CCC-SLP [facility] [phone] [facility] CENTER A [phone]   07/21/2013 2:15 PM Denzil Hughes, PT [facility] CENTER A [phone]   08/04/2013 1:30 PM Brittainy Delmer [facility] [phone]   10/17/2013 9:00 AM [facility] [place] [facility] [address] [phone]  10/17/2013 10:00 AM [facility] [place] [facility] [address] [phone]   10/17/2013 10:30 AM [facility] [place] [facility] [address] [phone]   10/17/2013 11:40 AM Carma Lair Nickel, NP [facility] [phone]   Future Orders Complete By Expires   Increase activity slowly  As directed        Medication List         aspirin 81 MG tablet  Take 81 mg by mouth daily.     carvedilol 12.5 MG tablet  Commonly known as:  COREG  Take 1 tablet (12.5 mg total) by mouth 2 (two) times daily with a meal. Takes 1 tablet daily after dialysis on  Mon, Wed, and Fri.  Take 1 tablet twice a day the rest of the week.     darbepoetin 40 MCG/0.4ML Soln injection  Commonly known as:  ARANESP  Inject 0.4 mLs (40 mcg total) into the vein every Monday with hemodialysis.     ezetimibe-simvastatin 10-80 MG per tablet  Commonly known as:  VYTORIN  Take 0.5 tablets by mouth daily.     famotidine 20 MG tablet  Commonly known as:  PEPCID  Take 1 tablet (20 mg total) by mouth daily.     feeding supplement (NEPRO CARB STEADY) Liqd  Take 237 mLs by mouth 2 (two) times daily between meals.     feeding supplement (PRO-STAT SUGAR FREE 64) Liqd  Take 30 mLs by mouth 2 (two) times daily.     ferric gluconate 125 mg in sodium chloride 0.9 % 100 mL  Inject 125 mg into the vein Every Tuesday,Thursday,and Saturday with dialysis.     insulin aspart 100 UNIT/ML injection  Commonly known as:  novoLOG  Inject 0-9 Units into the skin every 4 (four) hours.     insulin aspart protamine- aspart (70-30) 100 UNIT/ML injection  Commonly known as:  NOVOLOG MIX 70/30  Inject 0.08 mLs (8 Units total) into the skin 2 (two) times daily with a meal.     isosorbide mononitrate 30 MG 24 hr tablet  Commonly known as:  IMDUR  Take 30 mg by mouth daily.     isosorbide mononitrate 30 MG 24 hr tablet  Commonly known as:  IMDUR  Take 1 tablet (30 mg total) by mouth daily.     metroNIDAZOLE 500 MG tablet  Commonly known as:  FLAGYL  Take 1 tablet (500 mg total) by mouth 3 (three) times daily.     multivitamin Tabs tablet  Take 1 tablet by mouth daily.     PLAVIX 75 MG tablet  Generic drug:  clopidogrel  Take 75 mg by mouth daily.     risperiDONE 0.5 MG tablet  Commonly known as:  RISPERDAL  Take 1 tablet (0.5 mg total) by mouth 2 (two) times daily.       No Known Allergies     Follow-up Information   Follow up with Robbie Lis, PA-C On 08/04/2013. (See for Dr. Tresa Endo at 1:30 pm.)    Specialty:  Cardiology   Contact information:   [address] [place] [address] [phone]        The results of significant diagnostics from this hospitalization (including imaging, microbiology, ancillary and laboratory) are listed below for reference.    Significant Diagnostic Studies: Ir Pta Venous Left  07/18/2013   CLINICAL DATA:  80 year old with end-stage renal disease and pulling clots during dialysis. The patient has a left upper extremity cephalic  vein fistula.  EXAM: LEFT UPPER EXTREMITY FISTULOGRAM; VENOUS ANGIOPLASTY; FISTULA DECLOT  Physician: Rachelle HoraAdam R. Henn, MD  FLUOROSCOPY TIME:  12 min and 24 seconds  MEDICATIONS AND MEDICAL HISTORY: 50 mcg fentanyl.  ANESTHESIA/SEDATION: A radiology nurse monitored the patient throughout the procedure.  PROCEDURE: The patient has an altered mental status and unable to give consent. Informed consent was obtained from the patient's daughter. Angiocath was placed in the left upper arm cephalic vein. Fistulogram images were obtained. Arm was prepped and draped in a sterile fashion. Maximal barrier sterile technique was utilized including caps, mask, sterile gowns, sterile gloves, sterile drape, hand hygiene and skin antiseptic. The skin was anesthetized with 1% lidocaine. Angiocath was eventually exchanged for 6 French vascular sheath over a stiff Amplatz wire. The catheter exchange was difficult due to the stenosis in the cephalic vein. A Bentson wire was advanced centrally. At this point, venogram demonstrated that the stenosis or thrombus was now occlusive. The presumed stenosis was treated with a 6 mm x 40 mm Conquest balloon. Follow venograms demonstrated persistent occlusion at this site. As a result, the patient was given 3000 units heparin through the sheath. An additional 2000 units were given approximately 30 per min later. The focal thrombosis was treated with the Angiojet thrombectomy device. This area was also treated with a 7 mm x 40 mm Conquest balloon. There was persistent occlusion  in the left cephalic vein. A 5 French catheter was advanced into the central veins and a pull-back venogram was obtained. The thrombus had migrated into the upper cephalic vein. The upper cephalic vein was successfully treated with the Angiojet thrombectomy device and treated with the 6 mm balloon. At this point, there was flow throughout the cephalic vein. Follow up venograms were obtained. The vascular sheath was removed with a pursestring suture.  FINDINGS: Initial fistulogram images demonstrated a severe stenosis in the left cephalic vein in the mid humeral region. The central veins and arterial anastomosis were patent. This severe stenosis was related to focal thrombus. Following placement of a catheter and wire, there was thrombosis of the cephalic vein. Ultrasound demonstrated thrombus in the mid humeral region. This area was treated with the Angiojet thrombectomy device. Thrombus did migrate into the more proximal cephalic vein and successful treated with the Angiojet device. Throughout the procedure, the patient appeared to be developing additional thrombus in the cephalic vein and, therefore, the patient was anticoagulated. At the end of procedure, there was improved flow throughout the left cephalic vein but there are was nonocclusive residual thrombus in the mid and lower humeral regions. Central veins are patent.  IMPRESSION: Treatment of focal thrombus in the left cephalic vein. At the end of the procedure, the occlusive thrombus was removed but there was residual nonocclusive thrombus.   Electronically Signed   By: Richarda OverlieAdam  Henn M.D.   On: 07/18/2013 18:10   Dg Chest Port 1 View  07/16/2013   CLINICAL DATA:  Edema  EXAM: PORTABLE CHEST - 1 VIEW  COMPARISON:  07/14/2013  FINDINGS: Right IJ central venous catheter projects over the distal superior vena cava. Mild cardiomegaly is stable. Atherosclerotic calcification thoracic aortic arch. There is diffuse pulmonary vascular congestion and diffuse  interstitial prominence. Aeration of the lungs appear similar to 07/14/2013 chest radiograph. No focal consolidation. No definite visible pleural effusion by portable technique. Negative for pneumothorax. Imaged bones are unremarkable.  IMPRESSION: No significant change and mild pulmonary edema pattern.   Electronically Signed   By: Vinetta BergamoSusan  Turner M.D.  On: 07/16/2013 08:15   Dg Chest Port 1 View  07/14/2013   CLINICAL DATA:  Layering bilateral pleural effusions  EXAM: PORTABLE CHEST - 1 VIEW  COMPARISON:  07/13/2013 and previous  FINDINGS: Endotracheal tube has its tip 1.5 cm above the carina. Nasogastric tube enters the abdomen. Right internal jugular central line has its tip in the SVC above the right atrium. Mild interstitial and alveolar edema persists. Small amount of layering fluid is probably present, grossly unchanged. No new finding.  IMPRESSION: Persistent mild residual edema and layering pleural effusions. Lines and tubes well positioned.   Electronically Signed   By: Paulina Fusi M.D.   On: 07/14/2013 08:04   Dg Chest Port 1 View  07/13/2013   CLINICAL DATA:  Check endotracheal tube position  EXAM: PORTABLE CHEST - 1 VIEW  COMPARISON:  07/12/2013  FINDINGS: Endotracheal tube ends in the mid to lower thoracic trachea, 3 cm above the carina. Orogastric tube enters the stomach. Unchanged right IJ catheter, tip at the lower SVC.  No cardiomegaly. Stable upper mediastinal contours. Unchanged haziness of the lower chest, consistent with layering pleural fluid. Pulmonary venous congestion. Pulmonary edema seen previously has resolved. No pneumothorax.  IMPRESSION: 1. Stable positioning of tubes and lines. 2. Layering bilateral pleural effusions.   Electronically Signed   By: Tiburcio Pea M.D.   On: 07/13/2013 06:58   Dg Chest Port 1 View  07/12/2013   CLINICAL DATA:  Edema.  Renal failure.  EXAM: PORTABLE CHEST - 1 VIEW  COMPARISON:  07/11/2013  FINDINGS: Endotracheal tube remains in place with tip  approximately 3 cm above the carina. Right jugular central venous catheter remains in place with tip overlying the lower SVC. Enteric tube courses into the left upper abdomen, incompletely imaged. The cardiac silhouette remains mildly enlarged. Thoracic aortic calcification is noted. Diffuse bilateral pulmonary opacities appear slightly improved since the prior study. Small bilateral pleural effusions also may have decreased slightly in size. No pneumothorax is identified.  IMPRESSION: Mild interval improvement in pulmonary edema and small bilateral pleural effusions.   Electronically Signed   By: Sebastian Ache   On: 07/12/2013 11:18   Dg Chest Port 1 View  07/11/2013   CLINICAL DATA:  Shortness of breath.  EXAM: PORTABLE CHEST - 1 VIEW  COMPARISON:  Portable film earlier in the day.  FINDINGS: ET tube 3.9 cm above carina. Central venous catheter tip from right IJ approach lies at the SVC RA junction. The heart is enlarged. There is widespread bilateral pulmonary opacities consistent with pulmonary edema. Bilateral effusions. Worsening aeration compared with priors. No visible pneumothorax.  IMPRESSION: Worsening aeration.  Probable developing pulmonary edema.   Electronically Signed   By: Davonna Belling M.D.   On: 07/11/2013 17:07   Dg Chest Port 1 View  07/11/2013   CLINICAL DATA:  Chest pain.  Endotracheal tube position  EXAM: PORTABLE CHEST - 1 VIEW  COMPARISON:  07/11/2013  FINDINGS: Endotracheal tube in good position.  NG tube has been removed.  Improvement in edema and bilateral effusions since earlier today. There remains mild edema and small effusions. Improvement in bibasilar atelectasis.  IMPRESSION: Endotracheal tube in good position. Improvement in pulmonary edema and bilateral effusions.   Electronically Signed   By: Marlan Palau M.D.   On: 07/11/2013 15:50   Dg Chest Port 1 View  07/11/2013   CLINICAL DATA:  Pneumonia  EXAM: PORTABLE CHEST - 1 VIEW  COMPARISON:  07/10/2013  FINDINGS: The  endotracheal tube  is noted in satisfactory position. The nasogastric catheter is seen within the stomach. The cardiac shadow is stable. Bilateral pleural effusions are again identified. Vascular congestion is again seen.  IMPRESSION: The overall appearance has worsened in the interval with increasing bilateral pleural effusions.   Electronically Signed   By: Alcide Clever M.D.   On: 07/11/2013 08:10   Dg Chest Port 1 View  07/10/2013   CLINICAL DATA:  Endotracheal tube position.  EXAM: PORTABLE CHEST - 1 VIEW  COMPARISON:  07/09/2013.  FINDINGS: Endotracheal tube tip 1.5 cm above the carina. Recommend retracting by 2.5 cm to avoid mainstem bronchus intubation with change in patient's neck position.  Diffuse airspace disease most notable mid-lower lung zones and greater on right. This may represent asymmetric pulmonary edema although infectious infiltrate not excluded in the proper clinical setting. Slight worsening of airspace disease right lung base.  Difficult to adequately assess cardiac size secondary to airspace disease.  Calcified mildly tortuous aorta.  Nasogastric tube courses below the diaphragm. Tip is not included on the present exam.  No gross pneumothorax.  IMPRESSION: Endotracheal tube tip 1.5 cm above the carina. Recommend retracting by 2.5 cm to avoid mainstem bronchus intubation with change in patient's neck position.  Slight worsening of airspace disease right lung base. Please see above.  This is a call report.   Electronically Signed   By: Bridgett Larsson M.D.   On: 07/10/2013 07:24   Dg Chest Port 1 View  07/09/2013   CLINICAL DATA:  Hypoxia  EXAM: PORTABLE CHEST - 1 VIEW  COMPARISON:  None.  FINDINGS: Endotracheal tube tip is at the carina. No pneumothorax. There is cardiomegaly with mild interstitial edema. There is consolidation in both bases. There is pulmonary venous hypertension. No adenopathy.  IMPRESSION: Endotracheal tube tip is at the carina. Advise withdrawing endotracheal tube  approximately 3 cm. There is underlying congestive heart failure. There is airspace consolidation in both bases. This opacity may represent alveolar edema. Superimposed pneumonia, however, cannot be excluded. Congestive heart failure and pneumonia may exist concurrently.   Electronically Signed   By: Bretta Bang M.D.   On: 07/09/2013 17:06   Dg Abd Portable 1v  07/09/2013   CLINICAL DATA:  NG tube placement  EXAM: PORTABLE ABDOMEN - 1 VIEW  COMPARISON:  None.  FINDINGS: Moderate gastric distention with gas. NG tube with tip in proximal stomach.  IMPRESSION: NG tube in place with tip in proximal stomach. Moderate gaseous distension of the stomach.   Electronically Signed   By: Natasha Mead M.D.   On: 07/09/2013 18:44    Microbiology: Recent Results (from the past 240 hour(s))  CLOSTRIDIUM DIFFICILE BY PCR     Status: Abnormal   Collection Time    07/17/13  6:33 PM      Result Value Ref Range Status   C difficile by pcr POSITIVE (*) NEGATIVE Final   Comment: CRITICAL RESULT CALLED TO, READ BACK BY AND VERIFIED WITH:     Juluis Mire RN 11:10 07/18/13 ([email or web address])     Labs: Basic Metabolic Panel:  Recent Labs Lab 07/14/13 0500 07/14/13 0700 07/15/13 0500 07/16/13 0400 07/17/13 1240 07/20/13 0500  NA 137  --  138 139 134* 134*  K 3.5*  --  3.6* 3.7 4.0 3.9  CL 94*  --  95* 94* 92* 90*  CO2 21  --  26 20 22 22   GLUCOSE 144*  --  190* 173* 196* 239*  BUN 52*  --  43*  62* 78* 70*  CREATININE 8.27*  --  6.62* 9.24* 11.74* 9.47*  CALCIUM 7.9*  --  7.9* 8.2* 7.6* 7.7*  MG  --  2.3 2.2  --   --   --   PHOS  --  4.7* 2.8  --   --  3.6   Liver Function Tests:  Recent Labs Lab 07/15/13 0500 07/20/13 0500  AST 31  --   ALT 28  --   ALKPHOS 110  --   BILITOT 0.3  --   PROT 6.5  --   ALBUMIN 2.9* 2.8*   No results found for this basename: LIPASE, AMYLASE,  in the last 168 hours No results found for this basename: AMMONIA,  in the last 168 hours CBC:  Recent Labs Lab  07/16/13 0400 07/17/13 1240 07/18/13 1900 07/19/13 0507 07/20/13 0500  WBC 14.0* 11.1* 10.3 9.4 10.0  HGB 9.4* 8.9* 8.8* 9.6* 8.0*  HCT 28.1* 26.0* 25.3* 28.5* 23.7*  MCV 96.2 94.2 92.7 95.0 94.8  PLT 239 254 270 296 295   Cardiac Enzymes: No results found for this basename: CKTOTAL, CKMB, CKMBINDEX, TROPONINI,  in the last 168 hours BNP: BNP (last 3 results)  Recent Labs  07/10/13 0910 07/13/13 0500  PROBNP 54473.0* 47075.0*   CBG:  Recent Labs Lab 07/19/13 1126 07/19/13 1758 07/19/13 2302 07/20/13 0353 07/20/13 1116  GLUCAP 261* 248* 198* 168* 122*       Signed:  Filmore Molyneux C Ehren Berisha  [facility] 07/20/2013, 5:19 PM

## 2013-07-20 NOTE — H&P (Signed)
Physical Medicine and Rehabilitation Admission H&P    No chief complaint on file. :  Chief complaint; weakness  HPI: [name] is a 80 y.o. right-handed non-English speaking Spanish male with end-stage renal disease and hemodialysis, PVD status post left femoral-popliteal bypass, diabetes mellitus and peripheral neuropathy. Admitted 07/09/2013 from outside hospital after presenting with fever, cough  and altered mental status. Patient noted to be very hypoxic felt to be secondary to pulmonary edema per chest x-ray and ultimately was intubated. Patient was transferred to [facility] for ongoing evaluation. He was ultimately extubated 07/15/2013. Noted findings of elevated troponin 9.94 without acute ST elevation or EKG suspect NSTEMI and cardiology services consulted. Underwent cardiac catheterization 07/12/2013 per Dr. Martinique showing severe 3 vessel obstructive coronary artery disease and placed on aspirin/ Plavix therapy. Subcutaneous heparin for DVT prophylaxis. Hemodialysis ongoing as per renal services. C. difficile specimen 07/17/2013 positive placed on Flagyl for 14 2015x14 days as well as contact precautions. Currently maintained on a dysphagia 2 thin liquid diet. Physical therapy evaluation completed 07/16/2013 with recommendations of physical medicine rehabilitation consult. Patient was admitted for comprehensive rehabilitation program  Patient with limited English   Review of Systems  All other systems reviewed and are negative.  Review of Systems  Constitutional: Positive for fever.  Respiratory: Positive for cough.  Neurological: Positive for weakness.  All other systems reviewed and are negative  Past Medical History  Diagnosis Date  . Diabetes mellitus   . Hypertension   . Hyperlipidemia   . Glaucoma   . Cataract   . Peripheral vascular disease   . ESRD on dialysis    Past Surgical History  Procedure Laterality Date  . Pr vein bypass  graft,aorto-fem-pop  02/20/2010    left fem pop with saphenous vein  . Av fistula placement  10/24/2009    left   Family History  Problem Relation Age of Onset  . Heart disease Father     Heart Disease before age 55  . Diabetes Father   . Hyperlipidemia Father   . Hypertension Father   . Hyperlipidemia Mother   . Hypertension Mother   . Heart disease Mother     Heart Disease before age 66  . Diabetes Brother   . Hyperlipidemia Brother   . Hypertension Brother    Social History:  reports that he quit smoking about 37 years ago. He quit smokeless tobacco use about 26 years ago. He reports that he does not drink alcohol or use illicit drugs. Allergies: No Known Allergies Medications Prior to Admission  Medication Sig Dispense Refill  . aspirin 81 MG tablet Take 81 mg by mouth daily.      . carvedilol (COREG) 3.125 MG tablet Take 3.125 mg by mouth See admin instructions. Takes 1 tablet daily after dialysis on Mon, Wed, and Fri.  Take 1 tablet twice a day the rest of the week.      . clopidogrel (PLAVIX) 75 MG tablet Take 75 mg by mouth daily.      Marland Kitchen ezetimibe-simvastatin (VYTORIN) 10-80 MG per tablet Take 0.5 tablets by mouth daily.      . isosorbide mononitrate (IMDUR) 30 MG 24 hr tablet Take 30 mg by mouth daily.       . multivitamin (RENA-VIT) TABS tablet Take 1 tablet by mouth daily.      Marland Kitchen NOVOLOG MIX 70/30 (70-30) 100 UNIT/ML injection Inject 10 Units into the skin Twice daily.  Home: Home Living Family/patient expects to be discharged to:: Private residence Living Arrangements: Spouse/significant other;Children Available Help at Discharge: Family;Other (Comment) (unsure if family can provide 24/7 (A) ) Additional Comments: pt is poor historian with interpreter; no family present    Functional History: Prior Function Comments: pt reported he was independent prior to admission;  unsure if pt is accurate   Functional Status:  Mobility:      Ambulation/Gait Ambulation Distance (Feet): 12 Feet General Gait Details: cues for keeping up inside walker, for forward gaze (out window) and for maintaining upright posture (pt indicating wants to sit in chair    ADL:    Cognition: Cognition Overall Cognitive Status: Difficult to assess Orientation Level: Oriented to person;Disoriented to time;Disoriented to situation;Oriented to place Cognition Arousal/Alertness: Awake/alert Behavior During Therapy: Impulsive Overall Cognitive Status: Difficult to assess Area of Impairment: Orientation;Following commands;Safety/judgement;Problem solving;Awareness Orientation Level: Disoriented to;Situation;Place;Time Following Commands: Follows one step commands inconsistently Safety/Judgement: Decreased awareness of deficits;Decreased awareness of safety Problem Solving: Slow processing;Difficulty sequencing;Requires verbal cues;Requires tactile cues General Comments: difficulty following multimodal commands during session; when family arrived, pt stating "I will pay for it if I borrow it."  Question if referring to walker Difficult to assess due to: Non-English speaking  Physical Exam: Blood pressure 137/38, pulse 72, temperature 98 F (36.7 C), temperature source Axillary, resp. rate 16, height _0  (1.626 m), weight 134 lb 4.2 oz (60.9 kg), SpO2 100.00%. Physical Exam Vitals reviewed.  HENT:  Head: Normocephalic.  Eyes: EOM are normal.  Neck: Normal range of motion. Neck supple. No thyromegaly present.  Cardiovascular: Normal rate and regular rhythm.  Respiratory: Effort normal and breath sounds normal. No respiratory distress.  GI: Soft. Bowel sounds are normal. He exhibits no distension.  Neurological: He is alert. No cranial nerve deficit. He exhibits normal muscle tone.  Patient speaks very little english but seems to follow most simple commands with tactile cueing. He made good eye contact with examiner. Fairly alert. Family was at  bedside to help translate. Moves all 4's. UE 3+ to 4/5 prox to distal. LE 3/5 HF, KE and 4/5 ankles. Senses pain.  Skin: Skin is warm and dry.  Psychiatric: He has a normal mood and affect  Results for orders placed during the hospital encounter of 07/09/13 (from the past 48 hour(s))  GLUCOSE, CAPILLARY     Status: Abnormal   Collection Time    07/18/13  5:18 PM      Result Value Ref Range   Glucose-Capillary 138 (*) 70 - 99 mg/dL  CBC     Status: Abnormal   Collection Time    07/18/13  7:00 PM      Result Value Ref Range   WBC 10.3  4.0 - 10.5 K/uL   RBC 2.73 (*) 4.22 - 5.81 MIL/uL   Hemoglobin 8.8 (*) 13.0 - 17.0 g/dL   HCT 25.3 (*) 39.0 - 52.0 %   MCV 92.7  78.0 - 100.0 fL   MCH 32.2  26.0 - 34.0 pg   MCHC 34.8  30.0 - 36.0 g/dL   RDW 14.8  11.5 - 15.5 %   Platelets 270  150 - 400 K/uL  GLUCOSE, CAPILLARY     Status: Abnormal   Collection Time    07/18/13 11:22 PM      Result Value Ref Range   Glucose-Capillary 127 (*) 70 - 99 mg/dL  CBC     Status: Abnormal   Collection Time    07/19/13  5:07  AM      Result Value Ref Range   WBC 9.4  4.0 - 10.5 K/uL   RBC 3.00 (*) 4.22 - 5.81 MIL/uL   Hemoglobin 9.6 (*) 13.0 - 17.0 g/dL   HCT 28.5 (*) 39.0 - 52.0 %   MCV 95.0  78.0 - 100.0 fL   MCH 32.0  26.0 - 34.0 pg   MCHC 33.7  30.0 - 36.0 g/dL   RDW 14.9  11.5 - 15.5 %   Platelets 296  150 - 400 K/uL  GLUCOSE, CAPILLARY     Status: Abnormal   Collection Time    07/19/13  5:19 AM      Result Value Ref Range   Glucose-Capillary 245 (*) 70 - 99 mg/dL  GLUCOSE, CAPILLARY     Status: Abnormal   Collection Time    07/19/13  8:38 AM      Result Value Ref Range   Glucose-Capillary 210 (*) 70 - 99 mg/dL  GLUCOSE, CAPILLARY     Status: Abnormal   Collection Time    07/19/13 11:26 AM      Result Value Ref Range   Glucose-Capillary 261 (*) 70 - 99 mg/dL  GLUCOSE, CAPILLARY     Status: Abnormal   Collection Time    07/19/13  5:58 PM      Result Value Ref Range    Glucose-Capillary 248 (*) 70 - 99 mg/dL  GLUCOSE, CAPILLARY     Status: Abnormal   Collection Time    07/19/13 11:02 PM      Result Value Ref Range   Glucose-Capillary 198 (*) 70 - 99 mg/dL  GLUCOSE, CAPILLARY     Status: Abnormal   Collection Time    07/20/13  3:53 AM      Result Value Ref Range   Glucose-Capillary 168 (*) 70 - 99 mg/dL  CBC     Status: Abnormal   Collection Time    07/20/13  5:00 AM      Result Value Ref Range   WBC 10.0  4.0 - 10.5 K/uL   RBC 2.50 (*) 4.22 - 5.81 MIL/uL   Hemoglobin 8.0 (*) 13.0 - 17.0 g/dL   HCT 23.7 (*) 39.0 - 52.0 %   MCV 94.8  78.0 - 100.0 fL   MCH 32.0  26.0 - 34.0 pg   MCHC 33.8  30.0 - 36.0 g/dL   RDW 14.5  11.5 - 15.5 %   Platelets 295  150 - 400 K/uL  RENAL FUNCTION PANEL     Status: Abnormal   Collection Time    07/20/13  5:00 AM      Result Value Ref Range   Sodium 134 (*) 137 - 147 mEq/L   Potassium 3.9  3.7 - 5.3 mEq/L   Chloride 90 (*) 96 - 112 mEq/L   CO2 22  19 - 32 mEq/L   Glucose, Bld 239 (*) 70 - 99 mg/dL   BUN 70 (*) 6 - 23 mg/dL   Creatinine, Ser 9.47 (*) 0.50 - 1.35 mg/dL   Calcium 7.7 (*) 8.4 - 10.5 mg/dL   Phosphorus 3.6  2.3 - 4.6 mg/dL   Albumin 2.8 (*) 3.5 - 5.2 g/dL   GFR calc non Af Amer 5 (*) >90 mL/min   GFR calc Af Amer 5 (*) >90 mL/min   Comment: (NOTE)     The eGFR has been calculated using the CKD EPI equation.     This calculation has not been validated in all  clinical situations.     eGFR's persistently <90 mL/min signify possible Chronic Kidney     Disease.  GLUCOSE, CAPILLARY     Status: Abnormal   Collection Time    07/20/13 11:16 AM      Result Value Ref Range   Glucose-Capillary 122 (*) 70 - 99 mg/dL   Ir Pta Venous Left  07/18/2013   CLINICAL DATA:  80 year old with end-stage renal disease and pulling clots during dialysis. The patient has a left upper extremity cephalic vein fistula.  EXAM: LEFT UPPER EXTREMITY FISTULOGRAM; VENOUS ANGIOPLASTY; FISTULA DECLOT  Physician: Stephan Minister. Henn,  MD  FLUOROSCOPY TIME:  12 min and 24 seconds  MEDICATIONS AND MEDICAL HISTORY: 50 mcg fentanyl.  ANESTHESIA/SEDATION: A radiology nurse monitored the patient throughout the procedure.  PROCEDURE: The patient has an altered mental status and unable to give consent. Informed consent was obtained from the patient's daughter. Angiocath was placed in the left upper arm cephalic vein. Fistulogram images were obtained. Arm was prepped and draped in a sterile fashion. Maximal barrier sterile technique was utilized including caps, mask, sterile gowns, sterile gloves, sterile drape, hand hygiene and skin antiseptic. The skin was anesthetized with 1% lidocaine. Angiocath was eventually exchanged for 6 French vascular sheath over a stiff Amplatz wire. The catheter exchange was difficult due to the stenosis in the cephalic vein. A Bentson wire was advanced centrally. At this point, venogram demonstrated that the stenosis or thrombus was now occlusive. The presumed stenosis was treated with a 6 mm x 40 mm Conquest balloon. Follow venograms demonstrated persistent occlusion at this site. As a result, the patient was given 3000 units heparin through the sheath. An additional 2000 units were given approximately 30 per min later. The focal thrombosis was treated with the Angiojet thrombectomy device. This area was also treated with a 7 mm x 40 mm Conquest balloon. There was persistent occlusion in the left cephalic vein. A 5 French catheter was advanced into the central veins and a pull-back venogram was obtained. The thrombus had migrated into the upper cephalic vein. The upper cephalic vein was successfully treated with the Angiojet thrombectomy device and treated with the 6 mm balloon. At this point, there was flow throughout the cephalic vein. Follow up venograms were obtained. The vascular sheath was removed with a pursestring suture.  FINDINGS: Initial fistulogram images demonstrated a severe stenosis in the left cephalic vein  in the mid humeral region. The central veins and arterial anastomosis were patent. This severe stenosis was related to focal thrombus. Following placement of a catheter and wire, there was thrombosis of the cephalic vein. Ultrasound demonstrated thrombus in the mid humeral region. This area was treated with the Angiojet thrombectomy device. Thrombus did migrate into the more proximal cephalic vein and successful treated with the Angiojet device. Throughout the procedure, the patient appeared to be developing additional thrombus in the cephalic vein and, therefore, the patient was anticoagulated. At the end of procedure, there was improved flow throughout the left cephalic vein but there are was nonocclusive residual thrombus in the mid and lower humeral regions. Central veins are patent.  IMPRESSION: Treatment of focal thrombus in the left cephalic vein. At the end of the procedure, the occlusive thrombus was removed but there was residual nonocclusive thrombus.   Electronically Signed   By: Markus Daft M.D.   On: 07/18/2013 18:10    Post Admission Physician Evaluation: 1. Functional deficits secondary  to deconditioning related to cardiovascular disease . 2. Patient  is admitted to receive collaborative, interdisciplinary care between the physiatrist, rehab nursing staff, and therapy team. 3. Patient's level of medical complexity and substantial therapy needs in context of that medical necessity cannot be provided at a lesser intensity of care such as a SNF. 4. Patient has experienced substantial functional loss from his/her baseline which was documented above under the "Functional History" and "Functional Status" headings.  Judging by the patient's diagnosis, physical exam, and functional history, the patient has potential for functional progress which will result in measurable gains while on inpatient rehab.  These gains will be of substantial and practical use upon discharge  in facilitating mobility and  self-care at the household level. 5. Physiatrist will provide 24 hour management of medical needs as well as oversight of the therapy plan/treatment and provide guidance as appropriate regarding the interaction of the two. 6. 24 hour rehab nursing will assist with bladder management, bowel management, safety, skin/wound care, disease management, medication administration and pain management  and help integrate therapy concepts, techniques,education, etc. 7. PT will assess and treat for/with: pre gait, gait training, endurance , safety, equipment, neuromuscular re education.   Goals are: Supervision. 8. OT will assess and treat for/with: ADLs, Cognitive perceptual skills, Neuromuscular re education, safety, endurance, equipment.   Goals are: Supervision. 9. SLP will assess and treat for/with: NA.  Goals are: NA. 10. Case Management and Social Worker will assess and treat for psychological issues and discharge planning. 11. Team conference will be held weekly to assess progress toward goals and to determine barriers to discharge. 12. Patient will receive at least 3 hours of therapy per day at least 5 days per week. 13. ELOS: 2wk       14. Prognosis:  good   Medical Problem List and Plan: 1. Deconditioning related to morphine medical issues/encephalopathy 2. DVT Prophylaxis/Anticoagulation: Subcutaneous heparin. Monitor platelet counts and any signs of bleeding 3. Pain Management: Tylenol as needed. 4. Mood/encephalopathy: Continue Risperdal twice a day 5. Neuropsych: This patient ? capable of making decisions on ? own behalf. 6. End-stage renal disease with hemodialysis. Followup per renal services 7.NSTEMI. Continue aspirin/ Plavix/Imdur and Coreg 12.5 mg twice a day. Recent cardiac catheterization 3 vessel CAD. Continue medical management 8. Chronic anemia. Continue Aranesp 9. Positive Clostridium difficile. Continue Flagyl initiated 07/18/2012 x14 days total. Contact precautions 10. Diabetes  mellitus with peripheral neuropathy. Latest hemoglobin A1c of 5.3. Continue sliding scale insulin. 11. Hyperlipidemia. Lipitor Charlett Blake M.D. [facility] FAAPM&R (Sports Med, Neuromuscular Med) Diplomate Am Board of Electrodiagnostic Med   07/20/2013
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
## 2013-07-20 NOTE — Procedures (Signed)
Pt seen on HD. Ap 190 Vp 150.  SBP 106.  Lungs clear.  K 3.9, change to 4K.  Note eval by CIR.  His outpt HD days are MWF.

## 2013-07-21 ENCOUNTER — Inpatient Hospital Stay (HOSPITAL_COMMUNITY): Payer: Medicare Other

## 2013-07-21 ENCOUNTER — Inpatient Hospital Stay (HOSPITAL_COMMUNITY): Payer: Medicare Other | Admitting: *Deleted

## 2013-07-21 DIAGNOSIS — R5381 Other malaise: Secondary | ICD-10-CM

## 2013-07-21 LAB — GLUCOSE, CAPILLARY
GLUCOSE-CAPILLARY: 158 mg/dL — AB (ref 70–99)
GLUCOSE-CAPILLARY: 195 mg/dL — AB (ref 70–99)
GLUCOSE-CAPILLARY: 200 mg/dL — AB (ref 70–99)
GLUCOSE-CAPILLARY: 222 mg/dL — AB (ref 70–99)
Glucose-Capillary: 167 mg/dL — ABNORMAL HIGH (ref 70–99)
Glucose-Capillary: 171 mg/dL — ABNORMAL HIGH (ref 70–99)
Glucose-Capillary: 201 mg/dL — ABNORMAL HIGH (ref 70–99)

## 2013-07-21 MED ORDER — INSULIN ASPART PROT & ASPART (70-30 MIX) 100 UNIT/ML ~~LOC~~ SUSP
15.0000 [IU] | Freq: Two times a day (BID) | SUBCUTANEOUS | Status: DC
  Administered 2013-07-22: 15 [IU] via SUBCUTANEOUS
  Filled 2013-07-21: qty 10

## 2013-07-21 NOTE — Progress Notes (Signed)
[facility]     PROGRESS NOTE    Subjective/Complaints: Had a good night. Pt smiling and in good spirits. Only tried to get up once when he had to go to the bathroom  Objective: Vital Signs: Blood pressure 110/46, pulse 60, temperature 97 F (36.1 C), temperature source Oral, resp. rate 17, weight 59 kg (130 lb 1.1 oz), SpO2 98.00%. No results found.  Recent Labs  07/20/13 0500 07/20/13 2010  WBC 10.0 10.9*  HGB 8.0* 9.6*  HCT 23.7* 29.1*  PLT 295 340    Recent Labs  07/20/13 0500 07/20/13 2010  NA 134*  --   K 3.9  --   CL 90*  --   GLUCOSE 239*  --   BUN 70*  --   CREATININE 9.47* 5.09*  CALCIUM 7.7*  --    CBG (last 3)   Recent Labs  07/20/13 2353 07/21/13 0401 07/21/13 0749  GLUCAP 222* 167* 171*    Wt Readings from Last 3 Encounters:  07/21/13 59 kg (130 lb 1.1 oz)  07/20/13 60.9 kg (134 lb 4.2 oz)  07/20/13 60.9 kg (134 lb 4.2 oz)    Physical Exam:  HENT: oral mucosa pink and moist Head: Normocephalic.  Eyes: EOM are normal.  Neck: Normal range of motion. Neck supple. No thyromegaly present.  Cardiovascular: Normal rate and regular rhythm.  Respiratory: Effort normal and breath sounds normal. No respiratory distress.  GI: Soft. Bowel sounds are normal. He exhibits no distension.  Neurological: He is alert. No cranial nerve deficit. He exhibits normal muscle tone.  Patient speaks very little english but we are able to communicate with my basic spanish and his english. He made good eye contact. very alert.  Moves all 4's. UE 3+ to 4/5 prox to distal. LE 3/5 HF, KE and 4/5 ankles. Senses pain in all 4's Skin: Skin is warm and dry. Ext: no thrill thru AVG, minimal sound with auscultation Psychiatric: He has a normal mood and affect. Non-agitated. Not restless   Assessment/Plan: 1. Functional deficits secondary to deconditioning and encephalopathy which require 3+ hours per day of interdisciplinary therapy in a  comprehensive inpatient rehab setting. Physiatrist is providing close team supervision and 24 hour management of active medical problems listed below. Physiatrist and rehab team continue to assess barriers to discharge/monitor patient progress toward functional and medical goals. FIM: FIM - Bathing Bathing: 0: Activity did not occur (refused shower)  FIM - Upper Body Dressing/Undressing Upper body dressing/undressing: 0: Wears gown/pajamas-no public clothing FIM - Lower Body Dressing/Undressing Lower body dressing/undressing: 0: Wears gown/pajamas-no public clothing  FIM - Toileting Toileting steps completed by patient: Adjust clothing prior to toileting;Performs perineal hygiene;Adjust clothing after toileting Toileting: 5: Set-up assist to: Obtain supplies  FIM - Diplomatic Services operational officerToilet Transfers Toilet Transfers Assistive Devices: Grab bars Toilet Transfers: 4-To toilet/BSC: Min A (steadying Pt. > 75%);4-From toilet/BSC: Min A (steadying Pt. > 75%)  FIM - Bed/Chair Transfer Bed/Chair Transfer Assistive Devices: Bed rails Bed/Chair Transfer: 5: Supine > Sit: Supervision (verbal cues/safety issues);5: Sit > Supine: Supervision (verbal cues/safety issues);4: Bed > Chair or W/C: Min A (steadying Pt. > 75%);4: Chair or W/C > Bed: Min A (steadying Pt. > 75%)     Comprehension Comprehension Mode: Auditory Comprehension: 3-Understands basic 50 - 74% of the time/requires cueing 25 - 50%  of the time (with interpretter)  Expression Expression Mode: Verbal Expression: 2-Expresses basic 25 - 49% of the time/requires cueing 50 - 75% of the time. Uses  single words/gestures.  Social Interaction Social Interaction: 3-Interacts appropriately 50 - 74% of the time - May be physically or verbally inappropriate. (polite and partially cooperative with use of interpretter)  Problem Solving Problem Solving: 2-Solves basic 25 - 49% of the time - needs direction more than half the time to initiate, plan or complete  simple activities  Memory Memory Assistive Devices: Other (Comment) Memory: 3-Recognizes or recalls 50 - 74% of the time/requires cueing 25 - 49% of the time (difficult to assess due to language barrier)  Medical Problem List and Plan:  1. Deconditioning related to multiple medical issues/encephalopathy  2. DVT Prophylaxis/Anticoagulation: Subcutaneous heparin. Monitor platelet counts and any signs of bleeding  3. Pain Management: Tylenol as needed.   4. Mood/encephalopathy: Continue Risperdal twice a day   -sitter for safety for now 5. Neuropsych: This patient ? capable of making decisions on ? own behalf.  6. End-stage renal disease with hemodialysis. Followup per renal services   -will ask HD team to assess AVG 7.NSTEMI. Continue aspirin/ Plavix/Imdur and Coreg 12.5 mg twice a day. Recent cardiac catheterization 3 vessel CAD. Continue medical management  8. Chronic anemia. Continue Aranesp  9. Positive Clostridium difficile. Continue Flagyl initiated 07/18/2012 x14 days total. Contact precautions  10. Diabetes mellitus with peripheral neuropathy. Latest hemoglobin A1c of 5.3. Continue sliding scale insulin.  11. Hyperlipidemia. Lipitor  LOS (Days) 1 A FACE TO FACE EVALUATION WAS PERFORMED  Ranelle OysterZachary T Dennise Bamber 07/21/2013 9:04 AM

## 2013-07-21 NOTE — Interval H&P Note (Signed)
[name] was admitted today to Inpatient Rehabilitation with the diagnosis of deconditioning/encephalopathy.  The patient's history has been reviewed, patient examined, and there is no change in status.  Patient continues to be appropriate for intensive inpatient rehabilitation.  I have reviewed the patient's chart and labs.  Questions were answered to the patient's satisfaction.  Ranelle OysterZachary T Dannel Rafter 07/21/2013, 9:26 AM

## 2013-07-21 NOTE — Evaluation (Signed)
Speech Language Pathology Assessment and Plan  Patient Details  Name: [name] MRN: [id number] Date of Birth: [birth year]  SLP Diagnosis: Cognitive Impairments;Dysphagia  Rehab Potential: Good ELOS: 7-10 days   Today's Date: 07/21/2013 Time: 1194-1740 Time Calculation (min): 60 min  Problem List:  Patient Active Problem List   Diagnosis Date Noted  . Physical deconditioning 07/20/2013  . Non-ST elevation myocardial infarction (NSTEMI), initial episode of care 07/18/2013  . Acute encephalopathy 07/09/2013  . ESRD on dialysis 07/09/2013  . Unspecified essential hypertension 07/09/2013  . Type II or unspecified type diabetes mellitus with peripheral circulatory disorders, uncontrolled(250.72) 07/09/2013  . Acute respiratory failure 07/09/2013  . PVD (peripheral vascular disease) 05/15/2013  . Aftercare following surgery of the circulatory system, [place] 05/15/2013  . Peripheral vascular disease, unspecified 11/03/2011   Past Medical History:  Past Medical History  Diagnosis Date  . Diabetes mellitus   . Hypertension   . Hyperlipidemia   . Glaucoma   . Cataract   . Peripheral vascular disease   . ESRD on dialysis    Past Surgical History:  Past Surgical History  Procedure Laterality Date  . Pr vein bypass graft,aorto-fem-pop  02/20/2010    left fem pop with saphenous vein  . Av fistula placement  10/24/2009    left    Assessment / Plan / Recommendation Clinical Impression Pt is a 80 y.o. right-handed non-English speaking Spanish male with end-stage renal disease and hemodialysis, PVD status post left femoral-popliteal bypass, diabetes mellitus and peripheral neuropathy. Admitted 07/09/2013 from outside hospital after presenting with fever, cough and AMS. Pt noted to be very hypoxic felt to be secondary to pulmonary edema per CXR and ultimately was intubated. Pt was transferred to [facility] for ongoing evaluation. He was ultimately extubated 07/15/2013. Noted  findings of elevated troponin 9.94 without acute ST elevation or EKG suspect NSTEMI and cardiology services consulted. Underwent cardiac catheterization 07/12/2013 per Dr. [place] showing severe 3 vessel obstructive coronary artery disease and placed on aspirin/ Plavix therapy. Subcutaneous heparin for DVT prophylaxis. Hemodialysis ongoing as per renal services. C. difficile specimen 07/17/2013 positive placed on Flagyl for 14 2015x14 days as well as contact precautions. Currently maintained on a dysphagia 2 thin liquid diet. PT evaluation completed 07/16/2013 with recommendations of physical medicine rehabilitation consult. Pt was admitted for comprehensive rehabilitation program 4/16. SLP bedside swallowing evaluation revealed prolonged bolus manipulation suspect due in part to missing bottom dentition, however would recommend trial trays with SLP prior to advancement, as pt's dysphagia appears to be more cognitively based. Pt presents with disorientation to time and situation with confabulation, decreased sustained attention and recall of information, which impact his topic maintenance in conversation as well as his problem solving. Pt will benefit from skilled SLP services to maximize swallowing safety and functional independence prior to discharge home.   Skilled Therapeutic Interventions          SLP bedside swallow and cognitive-linguistic evaluations were completed with interpreter present, and results/recommendations, including 24/7 supervision upon d/c, were reviewed with patient as well as friend and family members who arrived at the end of the evaluations.2   SLP Assessment  Patient will need skilled Speech Lanaguage Pathology Services during CIR admission    Recommendations  Diet Recommendations: Dysphagia 2 (Fine chop);Thin liquid Liquid Administration via: Cup;Straw Medication Administration: Whole meds with puree Supervision: Full supervision/cueing for compensatory strategies;Patient able  to self feed Compensations: Slow rate;Small sips/bites Postural Changes and/or Swallow Maneuvers: Seated upright 90 degrees;Upright 30-60 min after  meal Oral Care Recommendations: Oral care BID Patient destination: Home Follow up Recommendations: Home Health SLP;Outpatient SLP;24 hour supervision/assistance Equipment Recommended: None recommended by SLP    SLP Frequency 5 out of 7 days   SLP Treatment/Interventions Cognitive remediation/compensation;Cueing hierarchy;Dysphagia/aspiration precaution training;Environmental controls;Functional tasks;Internal/external aids;Patient/family education    Pain   Prior Functioning Cognitive/Linguistic Baseline: Baseline deficits Baseline deficit details: per daughter and close friend, pt appeared to be at a supervision level for congitive tasks PTA, with the exception of with medication management, which he received Total A for Type of Home: Apartment  Lives With: Alone Available Help at Discharge: Other (Comment) (unclear what support pt has upon d/c) Vocation: Other (comment) (not working)  Short Term Goals: Week 1: SLP Short Term Goal 1 (Week 1): Pt will utilize safe swallowing strategies with recommended textures with Min cues SLP Short Term Goal 2 (Week 1): Pt will demonstrate adequate mastication and oral clearance with trials of Dys 3 textures with Min cues SLP Short Term Goal 3 (Week 1): Pt will use environmental cues to demonstrate orientation x4 with Mod cues SLP Short Term Goal 4 (Week 1): Pt will sustain attention to functional familiar task for at least 15 min with Min cues SLP Short Term Goal 5 (Week 1): Pt will utilize external memory aids to recall new/daily information with Min cues SLP Short Term Goal 6 (Week 1): Pt will maintain topic of conversation across 3 conversational turns with Min cues  See FIM for current functional status Refer to Care Plan for Long Term Goals  Recommendations for other services: None  Discharge  Criteria: Patient will be discharged from SLP if patient refuses treatment 3 consecutive times without medical reason, if treatment goals not met, if there is a change in medical status, if patient makes no progress towards goals or if patient is discharged from hospital.  The above assessment, treatment plan, treatment alternatives and goals were discussed and mutually agreed upon: by patient and by family   Germain Osgood, M.A. CCC-SLP [phone]  Germain Osgood 07/21/2013, 3:37 PM

## 2013-07-21 NOTE — Evaluation (Signed)
Physical Therapy Assessment and Plan  Patient Details  Name: [name] MRN: [id number] Date of Birth: 11-02-1933  PT Diagnosis: Abnormality of gait, Cognitive deficits, Impaired cognition and Muscle weakness Rehab Potential: Good ELOS: 7-9 days   Today's Date: 07/21/2013 Time: 5681-2751 Time Calculation (min): 60 min  Problem List:  Patient Active Problem List   Diagnosis Date Noted  . Physical deconditioning 07/20/2013  . Non-ST elevation myocardial infarction (NSTEMI), initial episode of care 07/18/2013  . Acute encephalopathy 07/09/2013  . ESRD on dialysis 07/09/2013  . Unspecified essential hypertension 07/09/2013  . Type II or unspecified type diabetes mellitus with peripheral circulatory disorders, uncontrolled(250.72) 07/09/2013  . Acute respiratory failure 07/09/2013  . PVD (peripheral vascular disease) 05/15/2013  . Aftercare following surgery of the circulatory system, [place] 05/15/2013  . Peripheral vascular disease, unspecified 11/03/2011    Past Medical History:  Past Medical History  Diagnosis Date  . Diabetes mellitus   . Hypertension   . Hyperlipidemia   . Glaucoma   . Cataract   . Peripheral vascular disease   . ESRD on dialysis    Past Surgical History:  Past Surgical History  Procedure Laterality Date  . Pr vein bypass graft,aorto-fem-pop  02/20/2010    left fem pop with saphenous vein  . Av fistula placement  10/24/2009    left    Assessment & Plan Clinical Impression: [name] is a 80 y.o. right-handed non-English speaking Spanish male with end-stage renal disease and hemodialysis, PVD status post left femoral-popliteal bypass, diabetes mellitus and peripheral neuropathy. Admitted 07/09/2013 from outside hospital after presenting with fever, cough and altered mental status. Patient noted to be very hypoxic felt to be secondary to pulmonary edema per chest x-ray and ultimately was intubated. Patient was transferred to [facility] for ongoing evaluation. He was ultimately extubated 07/15/2013. Noted findings of elevated troponin 9.94 without acute ST elevation or EKG suspect NSTEMI and cardiology services consulted. Underwent cardiac catheterization 07/12/2013 per Dr. Martinique showing severe 3 vessel obstructive coronary artery disease and placed on aspirin/ Plavix therapy. Subcutaneous heparin for DVT prophylaxis. Hemodialysis ongoing as per renal services. C. difficile specimen 07/17/2013 positive placed on Flagyl for 14 2015x14 days as well as contact precautions. Currently maintained on a dysphagia 2 thin liquid diet. Physical therapy evaluation completed 07/16/2013 with recommendations of physical medicine rehabilitation consult. Patient was admitted for comprehensive rehabilitation program. Patient transferred to [facility] on 07/20/2013 .   Patient currently requires min with mobility secondary to muscle weakness, decreased cardiorespiratoy endurance, decreased awareness, decreased problem solving, decreased safety awareness and decreased memory and decreased standing balance, decreased postural control and decreased balance strategies.  Prior to hospitalization, patient was at unknown level (patient unable to report due to language barrier and cognitive impairments); no family to verify with mobility and lived with Alone in a   home.  Home access is   .  Patient will benefit from skilled PT intervention to maximize safe functional mobility, minimize fall risk and decrease caregiver burden for planned discharge home with 24 hour supervision.  Anticipate patient will benefit from follow up [place] at discharge.  PT - End of Session Activity Tolerance: Tolerates 30+ min activity with multiple rests Endurance Deficit: Yes Endurance Deficit Description: fatigues quickly, requires frequent rest breaks PT Assessment Rehab Potential: Good Barriers to Discharge: Decreased caregiver support Barriers to Discharge Comments: unsure of D/C  plans; unsure if patient's family can provide 24/7 supervision and unsure of home environment secondary to language barrier and  cognitive impairments PT Patient demonstrates impairments in the following area(s): Balance;Endurance;Motor;Safety PT Transfers Functional Problem(s): Bed Mobility;Bed to Chair;Car;Furniture;Floor PT Locomotion Functional Problem(s): Stairs;Wheelchair Mobility;Ambulation PT Plan PT Intensity: Minimum of 1-2 x/day ,45 to 90 minutes PT Frequency: 5 out of 7 days PT Duration Estimated Length of Stay: 7-9 days PT Treatment/Interventions: Ambulation/gait training;Balance/vestibular training;Cognitive remediation/compensation;Community reintegration;Discharge planning;Neuromuscular re-education;Functional mobility training;DME/adaptive equipment instruction;Disease management/prevention;Pain management;Patient/family education;Psychosocial support;Splinting/orthotics;UE/LE Coordination activities;UE/LE Strength taining/ROM;Therapeutic Exercise;Therapeutic Activities;Wheelchair propulsion/positioning;Stair training PT Transfers Anticipated Outcome(s): supervision PT Locomotion Anticipated Outcome(s): supervision PT Recommendation Recommendations for Other Services: Speech consult Follow Up Recommendations: 24 hour supervision/assistance;Home health PT Patient destination: Home Equipment Recommended: To be determined Equipment Details: Unsure if patient owns any DME (secondary to language barrier and cognitive deficits-will need to verify with family); recommendations TBD upon discharge  Skilled Therapeutic Intervention Skilled therapeutic intervention initiated after completion of evaluation. Repeated sit<>stand transfers without use of UEs, additional gait training (see details below). Discussed falls risk, safety within room, and focus of therapy during stay. Discussed possible LOS, goals, and f/u therapy.  PT Evaluation Precautions/Restrictions Precautions Precautions:  Fall Precaution Comments: spanish speaking, poor safety awareness Restrictions Weight Bearing Restrictions: No General Chart Reviewed: Yes Family/Caregiver Present: No  Pain Pain Assessment Pain Assessment: No/denies pain Pain Score: 0-No pain Faces Pain Scale: Hurts little more Home Living/Prior Functioning Home Living Available Help at Discharge: Family;Other (Comment) (unsure if family can provide 24/7) Additional Comments: Home environment not validated during initial eval secondary to language barrier and suspected cognitive impairments.  Lives With: Alone Prior Function Comments: Prior functional status not validated during initial eval secondary to language barrier and suspected cognitive impairments Vision/Perception    Wears glasses all the time; No apparent visual/perceptual/praxis impairments noted during evaluation. Cognition Overall Cognitive Status: Difficult to assess (secondary to language barrier.) Arousal/Alertness: Awake/alert Orientation Level: Oriented X4 Attention: Focused;Sustained Focused Attention: Appears intact Sustained Attention: Appears intact Memory: Impaired Memory Impairment: Decreased recall of new information Awareness: Impaired Awareness Impairment: Emergent impairment Problem Solving: Impaired Behaviors: Impulsive Safety/Judgment: Impaired Sensation Sensation Light Touch: Appears Intact Stereognosis: Appears Intact Hot/Cold: Appears Intact Proprioception: Appears Intact Coordination Gross Motor Movements are Fluid and Coordinated: Yes Fine Motor Movements are Fluid and Coordinated: Yes Motor  Motor Motor: Abnormal postural alignment and control Motor - Skilled Clinical Observations: posterior lean in standing  Mobility Bed Mobility Bed Mobility: Supine to Sit;Sit to Supine Supine to Sit: HOB flat;4: Min guard Supine to Sit Details: Verbal cues for precautions/safety Sit to Supine: 4: Min guard;HOB flat Sit to Supine -  Details: Verbal cues for precautions/safety Transfers Transfers: Yes Sit to Stand: 4: Min assist;From chair/3-in-1;From bed;With armrests;With upper extremity assist Sit to Stand Details: Verbal cues for precautions/safety;Tactile cues for placement;Tactile cues for sequencing;Tactile cues for posture Stand to Sit: 4: Min assist;With upper extremity assist;With armrests;To chair/3-in-1;To bed Stand to Sit Details (indicate cue type and reason): Verbal cues for precautions/safety;Tactile cues for sequencing;Tactile cues for posture;Tactile cues for placement Stand Pivot Transfers: 4: Min assist;With armrests Stand Pivot Transfer Details: Tactile cues for posture;Tactile cues for placement;Tactile cues for sequencing;Verbal cues for precautions/safety Stand Pivot Transfer Details (indicate cue type and reason): Patient benefits from tactile cues due to language barrier. Locomotion  Ambulation Ambulation: Yes Ambulation/Gait Assistance: 4: Min assist Ambulation Distance (Feet): 90 Feet Assistive device: Rolling walker;1 person hand held assist Ambulation/Gait Assistance Details: Tactile cues for posture;Tactile cues for weight shifting;Verbal cues for precautions/safety Ambulation/Gait Assistance Details: Patient performed gait training in controlled environment 64' x1 with RW and minA,  83' x1 and 175' x1 with R HHA and minA. Gait Gait: Yes Gait Pattern: Impaired Gait Pattern: Trunk flexed;Lateral trunk lean to left;Narrow base of support;Step-through pattern;Decreased stride length Stairs / Additional Locomotion Stairs: Yes Stairs Assistance: 4: Min assist Stairs Assistance Details: Verbal cues for precautions/safety;Visual cues/gestures for sequencing;Tactile cues for sequencing Stair Management Technique: Two rails;Alternating pattern;Forwards Number of Stairs: 5 Height of Stairs: 6 Wheelchair Mobility Wheelchair Mobility: Yes Wheelchair Assistance: 4: Advertising account executive  Details: Visual cues for safe use of DME/AE;Verbal cues for precautions/safety;Visual cues/gestures for sequencing;Tactile cues for sequencing Wheelchair Propulsion: Both upper extremities Wheelchair Parts Management: Needs assistance Distance: 40  Trunk/Postural Assessment  Cervical Assessment Cervical Assessment: Within Functional Limits Thoracic Assessment Thoracic Assessment: Within Functional Limits Lumbar Assessment Lumbar Assessment: Within Functional Limits Postural Control Postural Control: Deficits on evaluation Righting Reactions: delayed Protective Responses: delayed Postural Limitations: posterior lean in standing; L lateral trunk lean during gait.  Balance Balance Balance Assessed: Yes Static Sitting Balance Static Sitting - Balance Support: Bilateral upper extremity supported;No upper extremity supported;Feet supported Static Sitting - Level of Assistance: 5: Stand by assistance Static Standing Balance Static Standing - Balance Support: No upper extremity supported;During functional activity Static Standing - Level of Assistance: 5: Stand by assistance;4: Min assist Static Standing - Comment/# of Minutes: MinA required secondary to posterior LOB upon standing Extremity Assessment  RLE Assessment RLE Assessment: Within Functional Limits (Grossly 3+ to 4/5) LLE Assessment LLE Assessment: Within Functional Limits (Grossly 3+ to 4/5)  FIM:  FIM - Bed/Chair Transfer Bed/Chair Transfer Assistive Devices: Arm rests Bed/Chair Transfer: 4: Supine > Sit: Min A (steadying Pt. > 75%/lift 1 leg);4: Sit > Supine: Min A (steadying pt. > 75%/lift 1 leg);4: Bed > Chair or W/C: Min A (steadying Pt. > 75%);4: Chair or W/C > Bed: Min A (steadying Pt. > 75%) FIM - Locomotion: Wheelchair Distance: 40 Locomotion: Wheelchair: 1: Travels less than 50 ft with minimal assistance (Pt.>75%) FIM - Locomotion: Ambulation Locomotion: Ambulation Assistive Devices: Astronomer Ambulation/Gait Assistance: 4: Min assist Locomotion: Ambulation: 2: Travels 50 - 149 ft with minimal assistance (Pt.>75%) FIM - Locomotion: Stairs Locomotion: Scientist, physiological: Hand rail - 2 Locomotion: Stairs: 2: Up and Down 4 - 11 stairs with minimal assistance (Pt.>75%)   Refer to Care Plan for Long Term Goals  Recommendations for other services: None  Discharge Criteria: Patient will be discharged from PT if patient refuses treatment 3 consecutive times without medical reason, if treatment goals not met, if there is a change in medical status, if patient makes no progress towards goals or if patient is discharged from hospital.  The above assessment, treatment plan, treatment alternatives and goals were discussed and mutually agreed upon: by patient  Lillia Abed. Yamile Roedl, PT, DPT 07/21/2013, 9:54 AM

## 2013-07-21 NOTE — Plan of Care (Signed)
Problem: RH SAFETY Goal: RH STG DEMO UNDERSTANDING HOME SAFETY PRECAUTIONS supervision  Outcome: Not Progressing Language barrier

## 2013-07-21 NOTE — Plan of Care (Signed)
Problem: RH SAFETY Goal: RH STG ADHERE TO SAFETY PRECAUTIONS W/ASSISTANCE/DEVICE STG Adhere to Safety Precautions With Assistance/Device.supervision  Outcome: Not Progressing Language barrier, using bed alarm and sitter for first night

## 2013-07-21 NOTE — H&P (View-Only) (Signed)
Physical Medicine and Rehabilitation Admission H&P    No chief complaint on file. :  Chief complaint; weakness  HPI: [name] is a 80 y.o. right-handed non-English speaking Spanish male with end-stage renal disease and hemodialysis, PVD status post left femoral-popliteal bypass, diabetes mellitus and peripheral neuropathy. Admitted 07/09/2013 from outside hospital after presenting with fever, cough  and altered mental status. Patient noted to be very hypoxic felt to be secondary to pulmonary edema per chest x-ray and ultimately was intubated. Patient was transferred to [facility] for ongoing evaluation. He was ultimately extubated 07/15/2013. Noted findings of elevated troponin 9.94 without acute ST elevation or EKG suspect NSTEMI and cardiology services consulted. Underwent cardiac catheterization 07/12/2013 per Dr. Martinique showing severe 3 vessel obstructive coronary artery disease and placed on aspirin/ Plavix therapy. Subcutaneous heparin for DVT prophylaxis. Hemodialysis ongoing as per renal services. C. difficile specimen 07/17/2013 positive placed on Flagyl for 14 2015x14 days as well as contact precautions. Currently maintained on a dysphagia 2 thin liquid diet. Physical therapy evaluation completed 07/16/2013 with recommendations of physical medicine rehabilitation consult. Patient was admitted for comprehensive rehabilitation program  Patient with limited English   Review of Systems  All other systems reviewed and are negative.  Review of Systems  Constitutional: Positive for fever.  Respiratory: Positive for cough.  Neurological: Positive for weakness.  All other systems reviewed and are negative  Past Medical History  Diagnosis Date  . Diabetes mellitus   . Hypertension   . Hyperlipidemia   . Glaucoma   . Cataract   . Peripheral vascular disease   . ESRD on dialysis    Past Surgical History  Procedure Laterality Date  . Pr vein bypass  graft,aorto-fem-pop  02/20/2010    left fem pop with saphenous vein  . Av fistula placement  10/24/2009    left   Family History  Problem Relation Age of Onset  . Heart disease Father     Heart Disease before age 55  . Diabetes Father   . Hyperlipidemia Father   . Hypertension Father   . Hyperlipidemia Mother   . Hypertension Mother   . Heart disease Mother     Heart Disease before age 66  . Diabetes Brother   . Hyperlipidemia Brother   . Hypertension Brother    Social History:  reports that he quit smoking about 37 years ago. He quit smokeless tobacco use about 26 years ago. He reports that he does not drink alcohol or use illicit drugs. Allergies: No Known Allergies Medications Prior to Admission  Medication Sig Dispense Refill  . aspirin 81 MG tablet Take 81 mg by mouth daily.      . carvedilol (COREG) 3.125 MG tablet Take 3.125 mg by mouth See admin instructions. Takes 1 tablet daily after dialysis on Mon, Wed, and Fri.  Take 1 tablet twice a day the rest of the week.      . clopidogrel (PLAVIX) 75 MG tablet Take 75 mg by mouth daily.      Marland Kitchen ezetimibe-simvastatin (VYTORIN) 10-80 MG per tablet Take 0.5 tablets by mouth daily.      . isosorbide mononitrate (IMDUR) 30 MG 24 hr tablet Take 30 mg by mouth daily.       . multivitamin (RENA-VIT) TABS tablet Take 1 tablet by mouth daily.      Marland Kitchen NOVOLOG MIX 70/30 (70-30) 100 UNIT/ML injection Inject 10 Units into the skin Twice daily.  Home: Home Living Family/patient expects to be discharged to:: Private residence Living Arrangements: Spouse/significant other;Children Available Help at Discharge: Family;Other (Comment) (unsure if family can provide 24/7 (A) ) Additional Comments: pt is poor historian with interpreter; no family present    Functional History: Prior Function Comments: pt reported he was independent prior to admission;  unsure if pt is accurate   Functional Status:  Mobility:      Ambulation/Gait Ambulation Distance (Feet): 12 Feet General Gait Details: cues for keeping up inside walker, for forward gaze (out window) and for maintaining upright posture (pt indicating wants to sit in chair    ADL:    Cognition: Cognition Overall Cognitive Status: Difficult to assess Orientation Level: Oriented to person;Disoriented to time;Disoriented to situation;Oriented to place Cognition Arousal/Alertness: Awake/alert Behavior During Therapy: Impulsive Overall Cognitive Status: Difficult to assess Area of Impairment: Orientation;Following commands;Safety/judgement;Problem solving;Awareness Orientation Level: Disoriented to;Situation;Place;Time Following Commands: Follows one step commands inconsistently Safety/Judgement: Decreased awareness of deficits;Decreased awareness of safety Problem Solving: Slow processing;Difficulty sequencing;Requires verbal cues;Requires tactile cues General Comments: difficulty following multimodal commands during session; when family arrived, pt stating "I will pay for it if I borrow it."  Question if referring to walker Difficult to assess due to: Non-English speaking  Physical Exam: Blood pressure 137/38, pulse 72, temperature 98 F (36.7 C), temperature source Axillary, resp. rate 16, height _0  (1.626 m), weight 134 lb 4.2 oz (60.9 kg), SpO2 100.00%. Physical Exam Vitals reviewed.  HENT:  Head: Normocephalic.  Eyes: EOM are normal.  Neck: Normal range of motion. Neck supple. No thyromegaly present.  Cardiovascular: Normal rate and regular rhythm.  Respiratory: Effort normal and breath sounds normal. No respiratory distress.  GI: Soft. Bowel sounds are normal. He exhibits no distension.  Neurological: He is alert. No cranial nerve deficit. He exhibits normal muscle tone.  Patient speaks very little english but seems to follow most simple commands with tactile cueing. He made good eye contact with examiner. Fairly alert. Family was at  bedside to help translate. Moves all 4's. UE 3+ to 4/5 prox to distal. LE 3/5 HF, KE and 4/5 ankles. Senses pain.  Skin: Skin is warm and dry.  Psychiatric: He has a normal mood and affect  Results for orders placed during the hospital encounter of 07/09/13 (from the past 48 hour(s))  GLUCOSE, CAPILLARY     Status: Abnormal   Collection Time    07/18/13  5:18 PM      Result Value Ref Range   Glucose-Capillary 138 (*) 70 - 99 mg/dL  CBC     Status: Abnormal   Collection Time    07/18/13  7:00 PM      Result Value Ref Range   WBC 10.3  4.0 - 10.5 K/uL   RBC 2.73 (*) 4.22 - 5.81 MIL/uL   Hemoglobin 8.8 (*) 13.0 - 17.0 g/dL   HCT 25.3 (*) 39.0 - 52.0 %   MCV 92.7  78.0 - 100.0 fL   MCH 32.2  26.0 - 34.0 pg   MCHC 34.8  30.0 - 36.0 g/dL   RDW 14.8  11.5 - 15.5 %   Platelets 270  150 - 400 K/uL  GLUCOSE, CAPILLARY     Status: Abnormal   Collection Time    07/18/13 11:22 PM      Result Value Ref Range   Glucose-Capillary 127 (*) 70 - 99 mg/dL  CBC     Status: Abnormal   Collection Time    07/19/13  5:07  AM      Result Value Ref Range   WBC 9.4  4.0 - 10.5 K/uL   RBC 3.00 (*) 4.22 - 5.81 MIL/uL   Hemoglobin 9.6 (*) 13.0 - 17.0 g/dL   HCT 28.5 (*) 39.0 - 52.0 %   MCV 95.0  78.0 - 100.0 fL   MCH 32.0  26.0 - 34.0 pg   MCHC 33.7  30.0 - 36.0 g/dL   RDW 14.9  11.5 - 15.5 %   Platelets 296  150 - 400 K/uL  GLUCOSE, CAPILLARY     Status: Abnormal   Collection Time    07/19/13  5:19 AM      Result Value Ref Range   Glucose-Capillary 245 (*) 70 - 99 mg/dL  GLUCOSE, CAPILLARY     Status: Abnormal   Collection Time    07/19/13  8:38 AM      Result Value Ref Range   Glucose-Capillary 210 (*) 70 - 99 mg/dL  GLUCOSE, CAPILLARY     Status: Abnormal   Collection Time    07/19/13 11:26 AM      Result Value Ref Range   Glucose-Capillary 261 (*) 70 - 99 mg/dL  GLUCOSE, CAPILLARY     Status: Abnormal   Collection Time    07/19/13  5:58 PM      Result Value Ref Range    Glucose-Capillary 248 (*) 70 - 99 mg/dL  GLUCOSE, CAPILLARY     Status: Abnormal   Collection Time    07/19/13 11:02 PM      Result Value Ref Range   Glucose-Capillary 198 (*) 70 - 99 mg/dL  GLUCOSE, CAPILLARY     Status: Abnormal   Collection Time    07/20/13  3:53 AM      Result Value Ref Range   Glucose-Capillary 168 (*) 70 - 99 mg/dL  CBC     Status: Abnormal   Collection Time    07/20/13  5:00 AM      Result Value Ref Range   WBC 10.0  4.0 - 10.5 K/uL   RBC 2.50 (*) 4.22 - 5.81 MIL/uL   Hemoglobin 8.0 (*) 13.0 - 17.0 g/dL   HCT 23.7 (*) 39.0 - 52.0 %   MCV 94.8  78.0 - 100.0 fL   MCH 32.0  26.0 - 34.0 pg   MCHC 33.8  30.0 - 36.0 g/dL   RDW 14.5  11.5 - 15.5 %   Platelets 295  150 - 400 K/uL  RENAL FUNCTION PANEL     Status: Abnormal   Collection Time    07/20/13  5:00 AM      Result Value Ref Range   Sodium 134 (*) 137 - 147 mEq/L   Potassium 3.9  3.7 - 5.3 mEq/L   Chloride 90 (*) 96 - 112 mEq/L   CO2 22  19 - 32 mEq/L   Glucose, Bld 239 (*) 70 - 99 mg/dL   BUN 70 (*) 6 - 23 mg/dL   Creatinine, Ser 9.47 (*) 0.50 - 1.35 mg/dL   Calcium 7.7 (*) 8.4 - 10.5 mg/dL   Phosphorus 3.6  2.3 - 4.6 mg/dL   Albumin 2.8 (*) 3.5 - 5.2 g/dL   GFR calc non Af Amer 5 (*) >90 mL/min   GFR calc Af Amer 5 (*) >90 mL/min   Comment: (NOTE)     The eGFR has been calculated using the CKD EPI equation.     This calculation has not been validated in all  clinical situations.     eGFR's persistently <90 mL/min signify possible Chronic Kidney     Disease.  GLUCOSE, CAPILLARY     Status: Abnormal   Collection Time    07/20/13 11:16 AM      Result Value Ref Range   Glucose-Capillary 122 (*) 70 - 99 mg/dL   Ir Pta Venous Left  07/18/2013   CLINICAL DATA:  80 year old with end-stage renal disease and pulling clots during dialysis. The patient has a left upper extremity cephalic vein fistula.  EXAM: LEFT UPPER EXTREMITY FISTULOGRAM; VENOUS ANGIOPLASTY; FISTULA DECLOT  Physician: Stephan Minister. Henn,  MD  FLUOROSCOPY TIME:  12 min and 24 seconds  MEDICATIONS AND MEDICAL HISTORY: 50 mcg fentanyl.  ANESTHESIA/SEDATION: A radiology nurse monitored the patient throughout the procedure.  PROCEDURE: The patient has an altered mental status and unable to give consent. Informed consent was obtained from the patient's daughter. Angiocath was placed in the left upper arm cephalic vein. Fistulogram images were obtained. Arm was prepped and draped in a sterile fashion. Maximal barrier sterile technique was utilized including caps, mask, sterile gowns, sterile gloves, sterile drape, hand hygiene and skin antiseptic. The skin was anesthetized with 1% lidocaine. Angiocath was eventually exchanged for 6 French vascular sheath over a stiff Amplatz wire. The catheter exchange was difficult due to the stenosis in the cephalic vein. A Bentson wire was advanced centrally. At this point, venogram demonstrated that the stenosis or thrombus was now occlusive. The presumed stenosis was treated with a 6 mm x 40 mm Conquest balloon. Follow venograms demonstrated persistent occlusion at this site. As a result, the patient was given 3000 units heparin through the sheath. An additional 2000 units were given approximately 30 per min later. The focal thrombosis was treated with the Angiojet thrombectomy device. This area was also treated with a 7 mm x 40 mm Conquest balloon. There was persistent occlusion in the left cephalic vein. A 5 French catheter was advanced into the central veins and a pull-back venogram was obtained. The thrombus had migrated into the upper cephalic vein. The upper cephalic vein was successfully treated with the Angiojet thrombectomy device and treated with the 6 mm balloon. At this point, there was flow throughout the cephalic vein. Follow up venograms were obtained. The vascular sheath was removed with a pursestring suture.  FINDINGS: Initial fistulogram images demonstrated a severe stenosis in the left cephalic vein  in the mid humeral region. The central veins and arterial anastomosis were patent. This severe stenosis was related to focal thrombus. Following placement of a catheter and wire, there was thrombosis of the cephalic vein. Ultrasound demonstrated thrombus in the mid humeral region. This area was treated with the Angiojet thrombectomy device. Thrombus did migrate into the more proximal cephalic vein and successful treated with the Angiojet device. Throughout the procedure, the patient appeared to be developing additional thrombus in the cephalic vein and, therefore, the patient was anticoagulated. At the end of procedure, there was improved flow throughout the left cephalic vein but there are was nonocclusive residual thrombus in the mid and lower humeral regions. Central veins are patent.  IMPRESSION: Treatment of focal thrombus in the left cephalic vein. At the end of the procedure, the occlusive thrombus was removed but there was residual nonocclusive thrombus.   Electronically Signed   By: Markus Daft M.D.   On: 07/18/2013 18:10    Post Admission Physician Evaluation: 1. Functional deficits secondary  to deconditioning related to cardiovascular disease . 2. Patient  is admitted to receive collaborative, interdisciplinary care between the physiatrist, rehab nursing staff, and therapy team. 3. Patient's level of medical complexity and substantial therapy needs in context of that medical necessity cannot be provided at a lesser intensity of care such as a SNF. 4. Patient has experienced substantial functional loss from his/her baseline which was documented above under the "Functional History" and "Functional Status" headings.  Judging by the patient's diagnosis, physical exam, and functional history, the patient has potential for functional progress which will result in measurable gains while on inpatient rehab.  These gains will be of substantial and practical use upon discharge  in facilitating mobility and  self-care at the household level. 5. Physiatrist will provide 24 hour management of medical needs as well as oversight of the therapy plan/treatment and provide guidance as appropriate regarding the interaction of the two. 6. 24 hour rehab nursing will assist with bladder management, bowel management, safety, skin/wound care, disease management, medication administration and pain management  and help integrate therapy concepts, techniques,education, etc. 7. PT will assess and treat for/with: pre gait, gait training, endurance , safety, equipment, neuromuscular re education.   Goals are: Supervision. 8. OT will assess and treat for/with: ADLs, Cognitive perceptual skills, Neuromuscular re education, safety, endurance, equipment.   Goals are: Supervision. 9. SLP will assess and treat for/with: NA.  Goals are: NA. 10. Case Management and Social Worker will assess and treat for psychological issues and discharge planning. 11. Team conference will be held weekly to assess progress toward goals and to determine barriers to discharge. 12. Patient will receive at least 3 hours of therapy per day at least 5 days per week. 13. ELOS: 2wk       14. Prognosis:  good   Medical Problem List and Plan: 1. Deconditioning related to morphine medical issues/encephalopathy 2. DVT Prophylaxis/Anticoagulation: Subcutaneous heparin. Monitor platelet counts and any signs of bleeding 3. Pain Management: Tylenol as needed. 4. Mood/encephalopathy: Continue Risperdal twice a day 5. Neuropsych: This patient ? capable of making decisions on ? own behalf. 6. End-stage renal disease with hemodialysis. Followup per renal services 7.NSTEMI. Continue aspirin/ Plavix/Imdur and Coreg 12.5 mg twice a day. Recent cardiac catheterization 3 vessel CAD. Continue medical management 8. Chronic anemia. Continue Aranesp 9. Positive Clostridium difficile. Continue Flagyl initiated 07/18/2012 x14 days total. Contact precautions 10. Diabetes  mellitus with peripheral neuropathy. Latest hemoglobin A1c of 5.3. Continue sliding scale insulin. 11. Hyperlipidemia. Lipitor Charlett Blake M.D. [facility] FAAPM&[id number] (Sports Med, Neuromuscular Med) Diplomate Am Board of Electrodiagnostic Med   07/20/2013

## 2013-07-21 NOTE — Progress Notes (Signed)
Family upset and concerned with poor control of patient's blood sugars. He was taking 70/30 insulin 15 units bid with good control. Will resume and monitor as po intake is good per nurse.

## 2013-07-21 NOTE — Progress Notes (Signed)
Physical Therapy Session Note  Patient Details  Name: [name] MRN: [id number] Date of Birth: 1933/07/17  Today's Date: 07/21/2013 Time: [id number] Time Calculation (min): 30 min  Short Term Goals: Week 1:  PT Short Term Goal 1 (Week 1): STGs=LTGs due to ELOS  Skilled Therapeutic Interventions/Progress Updates:  1:1. Pt received supine in bed, req mod encouragement to get OOB for participation in therapy likely due to language barrier. Focus this session on  functional transfers, ambulation and standing balance. Pt req overall min A for bed mobility, t/f sit<>stand and SPT bed>w/c w/ min cueing on safety. Pt participated in ball bounce/toss challenge incorporating forward/backwards ambulation and receiving ball from both sides w/ min-mod A. Pt amb from therapy gym>room w/ min A in mildly busy hallway environment, demonstrating good control when challenged with speed changes and stopping. Pt supine in bed at end of session w/ all needs in reach, bed alarm on.   Therapy Documentation Precautions:  Precautions Precautions: Fall Precaution Comments: spanish speaking, poor safety awareness Restrictions Weight Bearing Restrictions: No  See FIM for current functional status  Therapy/Group: Individual Therapy  Denzil HughesCaroline S Peola Joynt 07/21/2013, 4:20 PM

## 2013-07-21 NOTE — Evaluation (Signed)
Occupational Therapy Assessment and Plan  Patient Details  Name: [name] MRN: [id number] Date of Birth: 12-17-1933  OT Diagnosis: altered mental status and muscle weakness (generalized) Rehab Potential: Rehab Potential: Good ELOS:  7-10 days   Today's Date: 07/21/2013 Time: 0730-0830 Time Calculation (min): 60 min  Problem List:  Patient Active Problem List   Diagnosis Date Noted  . Physical deconditioning 07/20/2013  . Non-ST elevation myocardial infarction (NSTEMI), initial episode of care 07/18/2013  . Acute encephalopathy 07/09/2013  . ESRD on dialysis 07/09/2013  . Unspecified essential hypertension 07/09/2013  . Type II or unspecified type diabetes mellitus with peripheral circulatory disorders, uncontrolled(250.72) 07/09/2013  . Acute respiratory failure 07/09/2013  . PVD (peripheral vascular disease) 05/15/2013  . Aftercare following surgery of the circulatory system, [place] 05/15/2013  . Peripheral vascular disease, unspecified 11/03/2011    Past Medical History:  Past Medical History  Diagnosis Date  . Diabetes mellitus   . Hypertension   . Hyperlipidemia   . Glaucoma   . Cataract   . Peripheral vascular disease   . ESRD on dialysis    Past Surgical History:  Past Surgical History  Procedure Laterality Date  . Pr vein bypass graft,aorto-fem-pop  02/20/2010    left fem pop with saphenous vein  . Av fistula placement  10/24/2009    left    Assessment & Plan Clinical Impression: Patient is a 80 y.o. year old right-handed non-English speaking Spanish male with end-stage renal disease and hemodialysis, PVD status post left femoral-popliteal bypass, diabetes mellitus and peripheral neuropathy. Admitted 07/09/2013 from outside hospital after presenting with fever, cough and some and altered mental status. Patient noted to be very hypoxic felt to be secondary to pulmonary edema per chest x-ray and ultimately was intubated. Patient was transferred to [facility] for ongoing evaluation. Noted findings of elevated troponin 9.94 without acute ST elevation or EKG suspect NSTEMI and cardiology services consulted. Underwent cardiac catheterization 07/12/2013 per Dr. Martinique showing severe 3 vessel obstructive coronary artery disease and placed on aspirin Plavix therapy. Subcutaneous heparin for DVT prophylaxis. Hemodialysis ongoing as per renal services. Currently maintained on a dysphagia 2 thin liquid diet. Physical therapy evaluation completed 07/16/2013 with recommendations of physical medicine rehabilitation consult.  Patient transferred to CIR on 07/20/2013.    Patient currently requires mod assist with basic self-care skills secondary to muscle weakness.  Prior to hospitalization, patient could complete BADL independently (uncomfirmed) .  Patient will benefit from skilled intervention to increase independence with basic self-care skills prior to discharge home with care partner.  Anticipate patient will require 24 hour supervision and no further OT follow recommended.  OT - End of Session Activity Tolerance: Tolerates 10 - 20 min activity with multiple rests Endurance Deficit: Yes OT Assessment Rehab Potential: Good Barriers to Discharge: Decreased caregiver support OT Patient demonstrates impairments in the following area(s): Safety;Perception;Cognition;Balance;Endurance;Behavior OT Basic ADL's Functional Problem(s): Bathing;Dressing;Toileting OT Advanced ADL's Functional Problem(s): Simple Meal Preparation OT Transfers Functional Problem(s): Toilet;Tub/Shower OT Plan OT Intensity: Minimum of 1-2 x/day, 45 to 90 minutes OT Frequency: 5 out of 7 days OT Duration/Estimated Length of Stay: 7-10 days OT Treatment/Interventions: Therapeutic Activities;Therapeutic Exercise;Patient/family education;Psychosocial support;Discharge planning;Self Care/advanced ADL retraining;Functional mobility training;Balance/vestibular training;Community  reintegration OT Self Feeding Anticipated Outcome(s): Mod I OT Basic Self-Care Anticipated Outcome(s): Supervision OT Toileting Anticipated Outcome(s): Mod I OT Bathroom Transfers Anticipated Outcome(s): Mod I OT Recommendation Follow Up Recommendations: 24 hour supervision/assistance Equipment Recommended: To be determined  Skilled Therapeutic Intervention 1:1 OT initial  evaluation completed complicated by Spanish-only language barrier with confusion during telephone translation process.   Patient attended to conversation politely on phone with interpreter but his performance did not duplicate requests from OT and patient declined shower or toileting, with no change of clothing available.  Patient completed toileting to include transfer on/off toilet, in/out of bed, and washed his hands at sink with hand guidance for safety during mobility, while declining use of RW presented.   Patient became mildly frustrated by translation process and ceased use of phone after approximately 30 minutes of ADL.   Remaining session time limited to gestures and prompts with no family or friends available to validate prior level of performance or features of home environment.  OT Evaluation Precautions/Restrictions  Precautions Precautions: Fall Precaution Comments: spanish speaking, poor safety awareness Restrictions Weight Bearing Restrictions: No  General Chart Reviewed: Yes Family/Caregiver Present: No  Vital Signs Therapy Vitals Temp: 97 F (36.1 C) Temp src: Oral Pulse Rate: 60 Resp: 17 BP: 110/46 mmHg Patient Position, if appropriate: Sitting Oxygen Therapy SpO2: 98 % O2 Device: None (Room air)  Pain Pain Assessment Pain Assessment: No/denies pain Pain Score: 0-No pain Faces Pain Scale: Hurts little more  Home Living/Prior Functioning Home Living Available Help at Discharge: Other (Comment) (unclear what support pt has upon d/c) Type of Home: Apartment Additional Comments: Home  environment not validated during initial eval secondary to language barrier and suspected cognitive impairments.  Lives With: Alone Prior Function Vocation: Other (comment) (not working) Comments: Prior functional status not validated during initial eval secondary to language barrier and suspected cognitive impairments  ADL ADL ADL Comments: see FIM  Cognition Overall Cognitive Status: Difficult to assess Arousal/Alertness: Awake/alert Orientation Level: Oriented to person;Disoriented to place;Disoriented to situation Attention: Focused Focused Attention: Appears intact Memory: Impaired Memory Impairment: Decreased recall of new information Awareness: Impaired Awareness Impairment: Intellectual impairment Problem Solving: Impaired Behaviors: Restless Safety/Judgment: Impaired  Sensation Sensation Light Touch: Appears Intact Stereognosis: Appears Intact Hot/Cold: Appears Intact Proprioception: Appears Intact Coordination Gross Motor Movements are Fluid and Coordinated: Yes Fine Motor Movements are Fluid and Coordinated: Yes  Trunk/Postural Assessment  Cervical Assessment Cervical Assessment: Within Functional Limits Thoracic Assessment Thoracic Assessment: Within Functional Limits Lumbar Assessment Lumbar Assessment: Within Functional Limits Postural Control Postural Control: Deficits on evaluation Protective Responses: LOB 3 times during functional mobility and transfers; suspect posterior instability   Balance Static Sitting Balance Static Sitting - Balance Support: Bilateral upper extremity supported;No upper extremity supported;Feet supported Static Sitting - Level of Assistance: 5: Stand by assistance Dynamic Sitting Balance Sitting balance - Comments: sitting edge of bed with supervision about 45 seconds Static Standing Balance Static Standing - Balance Support: No upper extremity supported;During functional activity Static Standing - Level of Assistance: 5:  Stand by assistance;4: Min assist Static Standing - Comment/# of Minutes: Min assist required secondary to posterior LOB while standing  Extremity/Trunk Assessment RUE Assessment RUE Assessment: Within Functional Limits LUE Assessment LUE Assessment: Within Functional Limits  FIM:  FIM - Eating Eating Activity: 6: Assistive device: dentures FIM - Grooming Grooming Steps: Wash, rinse, dry hands Grooming: 2: Patient completes 1 of 4 or 2 of 5 steps FIM - Bathing Bathing: 0: Activity did not occur (refused shower) FIM - Upper Body Dressing/Undressing Upper body dressing/undressing: 0: Wears gown/pajamas-no public clothing FIM - Lower Body Dressing/Undressing Lower body dressing/undressing: 0: Wears gown/pajamas-no public clothing FIM - Toileting Toileting steps completed by patient: Adjust clothing prior to toileting;Performs perineal hygiene;Adjust clothing after toileting Toileting: 5:  Set-up assist to: Obtain supplies FIM - Control and instrumentation engineer Devices: Bed rails Bed/Chair Transfer: 5: Supine > Sit: Supervision (verbal cues/safety issues);5: Sit > Supine: Supervision (verbal cues/safety issues);4: Bed > Chair or W/C: Min A (steadying Pt. > 75%);4: Chair or W/C > Bed: Min A (steadying Pt. > 75%) FIM - Radio producer Devices: Grab bars Toilet Transfers: 4-To toilet/BSC: Min A (steadying Pt. > 75%);4-From toilet/BSC: Min A (steadying Pt. > 75%) FIM - Tub/Shower Transfers Tub/shower Transfers: 0-Activity did not occur or was simulated   Refer to Care Plan for Long Term Goals  Recommendations for other services: None  Discharge Criteria: Patient will be discharged from OT if patient refuses treatment 3 consecutive times without medical reason, if treatment goals not met, if there is a change in medical status, if patient makes no progress towards goals or if patient is discharged from hospital.  The above assessment, treatment  plan, treatment alternatives and goals were discussed and mutually agreed upon: No family available/patient unable  Salome Spotted 07/21/2013, 7:20 PM

## 2013-07-21 NOTE — Progress Notes (Signed)
Inpatient Diabetes Program Recommendations  AACE/ADA: New Consensus Statement on Inpatient Glycemic Control (2013)  Target Ranges:  Prepandial:   less than 140 mg/dL      Peak postprandial:   less than 180 mg/dL (1-2 hours)      Critically ill patients:  140 - 180 mg/dL   Reason for Visit: Hyperglycemia  Diabetes history: DM2 Outpatient Diabetes medications: 70/30 8 units bid and Novolog sensitive Q4H Current orders for Inpatient glycemic control: Novolog sensitive Q4H  Results for [name], Advay (MRN [id number]) as of 07/21/2013 12:49  Ref. Range 07/20/2013 23:53 07/21/2013 04:01 07/21/2013 07:49 07/21/2013 11:30  Glucose-Capillary Latest Range: 70-99 mg/dL 409222 (H) 811167 (H) 914171 (H) 201 (H)    Inpatient Diabetes Program Recommendations Insulin - Basal: Please consider addition of 70/30 4 units bid (1/2 home dose of 8 units bid) HgbA1C: 5.3%  Note: Will follow.  Thank you. Ailene Ardshonda Mozetta Murfin, RD, LDN, CDE Inpatient Diabetes Coordinator [phone]

## 2013-07-21 NOTE — Progress Notes (Signed)
S: eating better O:BP 110/46  Pulse 60  Temp(Src) 97 F (36.1 C) (Oral)  Resp 17  Wt 59 kg (130 lb 1.1 oz)  SpO2 98%  Intake/Output Summary (Last 24 hours) at 07/21/13 1047 Last data filed at 07/21/13 0745  Gross per 24 hour  Intake    240 ml  Output      0 ml  Net    240 ml   Weight change:  RUE:AVWUJGen:awake and alert CVS:RRR Resp:clear Abd:+bs NTND Ext:No edema.  LUA AVF + bruit.  Stitch still in place NEURO:CNI No asterixis   . antiseptic oral rinse  15 mL Mouth Rinse QID  . aspirin  81 mg Per Tube Daily  . atorvastatin  40 mg Oral q1800  . carvedilol  12.5 mg Oral BID WC  . chlorhexidine  15 mL Mouth Rinse BID  . clopidogrel  75 mg Oral Q breakfast  . [START ON 07/24/2013] darbepoetin (ARANESP) injection - DIALYSIS  40 mcg Intravenous Q Mon-HD  . famotidine  20 mg Per Tube Daily  . feeding supplement (NEPRO CARB STEADY)  237 mL Oral BID BM  . heparin  5,000 Units Subcutaneous 3 times per day  . insulin aspart  0-9 Units Subcutaneous 6 times per day  . isosorbide mononitrate  30 mg Oral Daily  . metroNIDAZOLE  500 mg Oral 3 times per day  . multivitamin  1 tablet Oral QHS  . risperiDONE  0.5 mg Oral BID   No results found. BMET    Component Value Date/Time   NA 134* 07/20/2013 0500   K 3.9 07/20/2013 0500   CL 90* 07/20/2013 0500   CO2 22 07/20/2013 0500   GLUCOSE 239* 07/20/2013 0500   BUN 70* 07/20/2013 0500   CREATININE 5.09* 07/20/2013 2010   CALCIUM 7.7* 07/20/2013 0500   GFRNONAA 10* 07/20/2013 2010   GFRAA 11* 07/20/2013 2010   CBC    Component Value Date/Time   WBC 10.9* 07/20/2013 2010   RBC 3.03* 07/20/2013 2010   HGB 9.6* 07/20/2013 2010   HCT 29.1* 07/20/2013 2010   PLT 340 07/20/2013 2010   MCV 96.0 07/20/2013 2010   MCH 31.7 07/20/2013 2010   MCHC 33.0 07/20/2013 2010   RDW 14.6 07/20/2013 2010     Assessment: 1. ESRD  2. pulm edema, resolved 3. Anemia on aranesp 4. Sec HPTH  Plan: 1. HD tomorrow 2. Remove suture Lt arm   [name]

## 2013-07-21 NOTE — Progress Notes (Signed)
Patient information reviewed and entered into eRehab system by Shayden Gingrich, RN, CRRN, PPS Coordinator.  Information including medical coding and functional independence measure will be reviewed and updated through discharge.     Per nursing patient was given "Data Collection Information Summary for Patients in Inpatient Rehabilitation Facilities with attached "Privacy Act Statement-Health Care Records" upon admission.  

## 2013-07-21 NOTE — Progress Notes (Signed)
Social Work Patient ID: [name], male   DOB: April 06, 1934, 80 y.o.   MRN: [id number] Trying to schedule an interpreter for speech evaluation this am.  Was unaware he would need one.  Will let team know when scheduled.

## 2013-07-22 ENCOUNTER — Inpatient Hospital Stay (HOSPITAL_COMMUNITY): Payer: Medicare Other | Admitting: Physical Therapy

## 2013-07-22 ENCOUNTER — Inpatient Hospital Stay (HOSPITAL_COMMUNITY): Payer: Medicare Other | Admitting: Speech Pathology

## 2013-07-22 ENCOUNTER — Inpatient Hospital Stay (HOSPITAL_COMMUNITY): Payer: Medicare Other | Admitting: *Deleted

## 2013-07-22 LAB — RENAL FUNCTION PANEL
ALBUMIN: 2.8 g/dL — AB (ref 3.5–5.2)
BUN: 74 mg/dL — ABNORMAL HIGH (ref 6–23)
CO2: 22 meq/L (ref 19–32)
CREATININE: 9.46 mg/dL — AB (ref 0.50–1.35)
Calcium: 7.8 mg/dL — ABNORMAL LOW (ref 8.4–10.5)
Chloride: 88 mEq/L — ABNORMAL LOW (ref 96–112)
GFR calc Af Amer: 5 mL/min — ABNORMAL LOW (ref >90)
GFR calc non Af Amer: 5 mL/min — ABNORMAL LOW (ref >90)
Glucose, Bld: 144 mg/dL — ABNORMAL HIGH (ref 70–99)
Phosphorus: 4.4 mg/dL (ref 2.3–4.6)
Potassium: 4.2 mEq/L (ref 3.7–5.3)
Sodium: 131 mEq/L — ABNORMAL LOW (ref 137–147)

## 2013-07-22 LAB — CBC
HEMATOCRIT: 25.8 % — AB (ref 39.0–52.0)
Hemoglobin: 8.6 g/dL — ABNORMAL LOW (ref 13.0–17.0)
MCH: 31.3 pg (ref 26.0–34.0)
MCHC: 33.3 g/dL (ref 30.0–36.0)
MCV: 93.8 fL (ref 78.0–100.0)
Platelets: 321 10*3/uL (ref 150–400)
RBC: 2.75 MIL/uL — ABNORMAL LOW (ref 4.22–5.81)
RDW: 14.6 % (ref 11.5–15.5)
WBC: 9.6 10*3/uL (ref 4.0–10.5)

## 2013-07-22 LAB — GLUCOSE, CAPILLARY
GLUCOSE-CAPILLARY: 240 mg/dL — AB (ref 70–99)
GLUCOSE-CAPILLARY: 59 mg/dL — AB (ref 70–99)
Glucose-Capillary: 164 mg/dL — ABNORMAL HIGH (ref 70–99)
Glucose-Capillary: 115 mg/dL — ABNORMAL HIGH (ref 70–99)
Glucose-Capillary: 96 mg/dL (ref 70–99)
Glucose-Capillary: 175 mg/dL — ABNORMAL HIGH (ref 70–99)

## 2013-07-22 MED ORDER — INSULIN ASPART 100 UNIT/ML ~~LOC~~ SOLN
0.0000 [IU] | Freq: Three times a day (TID) | SUBCUTANEOUS | Status: DC
Start: 2013-07-22 — End: 2013-07-27
  Administered 2013-07-23: 1 [IU] via SUBCUTANEOUS
  Administered 2013-07-23 (×2): 5 [IU] via SUBCUTANEOUS
  Administered 2013-07-24: 3 [IU] via SUBCUTANEOUS
  Administered 2013-07-24: 1 [IU] via SUBCUTANEOUS
  Administered 2013-07-24: 3 [IU] via SUBCUTANEOUS
  Administered 2013-07-25: 2 [IU] via SUBCUTANEOUS
  Administered 2013-07-25: 3 [IU] via SUBCUTANEOUS
  Administered 2013-07-26 – 2013-07-27 (×4): 1 [IU] via SUBCUTANEOUS

## 2013-07-22 NOTE — Progress Notes (Signed)
Received from hemodialysis.  Denies any pain or discomfort.  Settled back into room and caught up on med's missed while in hemodialysis.

## 2013-07-22 NOTE — Progress Notes (Signed)
S: in gym, doing well O:BP 110/45  Pulse 65  Temp(Src) 97.6 F (36.4 C) (Oral)  Resp 16  Wt 62 kg (136 lb 11 oz)  SpO2 98%  Intake/Output Summary (Last 24 hours) at 07/22/13 1332 Last data filed at 07/22/13 0900  Gross per 24 hour  Intake    440 ml  Output      0 ml  Net    440 ml    ZOX:WRUEAGen:awake and alert CVS:RRR Resp:clear Abd:+bs NTND Ext:No edema.  LUA AVF + bruit.   NEURO:CNI    . antiseptic oral rinse  15 mL Mouth Rinse QID  . aspirin  81 mg Per Tube Daily  . atorvastatin  40 mg Oral q1800  . carvedilol  12.5 mg Oral BID WC  . chlorhexidine  15 mL Mouth Rinse BID  . clopidogrel  75 mg Oral Q breakfast  . [START ON 07/24/2013] darbepoetin (ARANESP) injection - DIALYSIS  40 mcg Intravenous Q Mon-HD  . famotidine  20 mg Per Tube Daily  . feeding supplement (NEPRO CARB STEADY)  237 mL Oral BID BM  . heparin  5,000 Units Subcutaneous 3 times per day  . insulin aspart  0-9 Units Subcutaneous TID WC & HS  . insulin aspart protamine- aspart  15 Units Subcutaneous BID WC  . isosorbide mononitrate  30 mg Oral Daily  . metroNIDAZOLE  500 mg Oral 3 times per day  . multivitamin  1 tablet Oral QHS  . risperiDONE  0.5 mg Oral BID   No results found. BMET    Component Value Date/Time   NA 134* 07/20/2013 0500   K 3.9 07/20/2013 0500   CL 90* 07/20/2013 0500   CO2 22 07/20/2013 0500   GLUCOSE 239* 07/20/2013 0500   BUN 70* 07/20/2013 0500   CREATININE 5.09* 07/20/2013 2010   CALCIUM 7.7* 07/20/2013 0500   GFRNONAA 10* 07/20/2013 2010   GFRAA 11* 07/20/2013 2010   CBC    Component Value Date/Time   WBC 10.9* 07/20/2013 2010   RBC 3.03* 07/20/2013 2010   HGB 9.6* 07/20/2013 2010   HCT 29.1* 07/20/2013 2010   PLT 340 07/20/2013 2010   MCV 96.0 07/20/2013 2010   MCH 31.7 07/20/2013 2010   MCHC 33.0 07/20/2013 2010   RDW 14.6 07/20/2013 2010     Assessment: 1. ESRD  2. pulm edema, resolved 3. Anemia on aranesp 4. Sec HPTH  Plan: 1. HD today  Arita Missyan B Hayward Rylander

## 2013-07-22 NOTE — Progress Notes (Signed)
Speech Language Pathology Daily Session Note  Patient Details  Name: [name] MRN: [id number] Date of Birth: 17-Jul-1933  Today's Date: 07/22/2013 Time: [id number] Time Calculation (min): 40 min  Short Term Goals: Week 1: SLP Short Term Goal 1 (Week 1): Pt will utilize safe swallowing strategies with recommended textures with Min cues SLP Short Term Goal 2 (Week 1): Pt will demonstrate adequate mastication and oral clearance with trials of Dys 3 textures with Min cues SLP Short Term Goal 3 (Week 1): Pt will use environmental cues to demonstrate orientation x4 with Mod cues SLP Short Term Goal 4 (Week 1): Pt will sustain attention to functional familiar task for at least 15 min with Min cues SLP Short Term Goal 5 (Week 1): Pt will utilize external memory aids to recall new/daily information with Min cues SLP Short Term Goal 6 (Week 1): Pt will maintain topic of conversation across 3 conversational turns with Min cues  Skilled Therapeutic Interventions: Skilled treatment session focused on addressing cognitive goals.  Intrepreter present for session.  Patient required Mod cues for orientation and intellectual awareness of deficits during discussion; as well as Min cues to maintain topic and answer questions directly.  SLP also facilitated session with Mod cues to demonstrate accurate use of call bell.     FIM:  Comprehension Comprehension Mode: Auditory Comprehension: 3-Understands basic 50 - 74% of the time/requires cueing 25 - 50%  of the time Expression Expression Mode: Verbal Expression: 4-Expresses basic 75 - 89% of the time/requires cueing 10 - 24% of the time. Needs helper to occlude trach/needs to repeat words. Social Interaction Social Interaction: 2-Interacts appropriately 25 - 49% of time - Needs frequent redirection. Problem Solving Problem Solving: 2-Solves basic 25 - 49% of the time - needs direction more than half the time to initiate, plan or complete simple  activities Memory Memory: 2-Recognizes or recalls 25 - 49% of the time/requires cueing 51 - 75% of the time  Pain Pain Assessment Pain Assessment: No/denies pain  Therapy/Group: Individual Therapy  Charlane FerrettiMelissa Ahlijah Raia, M.A., CCC-SLP [phone]  Ophelia ShoulderMelissa M Percival Glasheen 07/22/2013, 11:10 AM

## 2013-07-22 NOTE — IPOC Note (Signed)
Overall Plan of Care [facility](IPOC) Patient Details Name: [name] MRN: [id number] DOB: 11-Jul-1933  Admitting Diagnosis: Deconditioned with enceph   Hospital Problems: Active Problems:   Physical deconditioning     Functional Problem List: Nursing Behavior;Bladder;Endurance;Medication Management;Nutrition;Safety;Sensory;Skin Integrity;Pain;Perception;Bowel  PT Balance;Endurance;Motor;Safety  OT Safety;Perception;Cognition;Balance;Endurance;Behavior  SLP Cognition  TR         Basic ADL's: OT Bathing;Dressing;Toileting     Advanced  ADL's: OT Simple Meal Preparation     Transfers: PT Bed Mobility;Bed to Chair;Car;Furniture;Floor  OT Toilet;Tub/Shower     Locomotion: PT Stairs;Wheelchair Mobility;Ambulation     Additional Impairments: OT    SLP Swallowing;Social Cognition   Problem Solving;Memory;Attention;Awareness  TR      Anticipated Outcomes Item Anticipated Outcome  Self Feeding Mod I  Swallowing  supervision with least restrictive PO   Basic self-care  Supervision  Toileting  Mod I   Bathroom Transfers Mod I  Bowel/Bladder  to be cont. of B&B  Transfers  supervision  Locomotion  supervision  Communication     Cognition  Min  Pain  Rate pain at 3  Safety/Judgment  Mod. independent   Therapy Plan: PT Intensity: Minimum of 1-2 x/day ,45 to 90 minutes PT Frequency: 5 out of 7 days PT Duration Estimated Length of Stay: 7-9 days OT Intensity: Minimum of 1-2 x/day, 45 to 90 minutes OT Frequency: 5 out of 7 days OT Duration/Estimated Length of Stay: 7-10 days SLP Intensity: Minumum of 1-2 x/day, 30 to 90 minutes SLP Frequency: 5 out of 7 days SLP Duration/Estimated Length of Stay: 7-10 days       Team Interventions: Nursing Interventions Patient/Family Education;Bladder Management;Bowel Management;Disease Management/Prevention;Pain Management;Medication Management;Skin Care/Wound Management;Cognitive Remediation/Compensation;Discharge  Planning;Psychosocial Support  PT interventions Ambulation/gait training;Balance/vestibular training;Cognitive remediation/compensation;Community reintegration;Discharge planning;Neuromuscular re-education;Functional mobility training;DME/adaptive equipment instruction;Disease management/prevention;Pain management;Patient/family education;Psychosocial support;Splinting/orthotics;UE/LE Coordination activities;UE/LE Strength taining/ROM;Therapeutic Exercise;Therapeutic Activities;Wheelchair propulsion/positioning;Stair training  OT Interventions Therapeutic Activities;Therapeutic Exercise;Patient/family education;Psychosocial support;Discharge planning;Self Care/advanced ADL retraining;Functional mobility training;Balance/vestibular training;Community reintegration  SLP Interventions Cognitive remediation/compensation;Cueing hierarchy;Dysphagia/aspiration precaution training;Environmental controls;Functional tasks;Internal/external aids;Patient/family education  TR Interventions    SW/CM Interventions      Team Discharge Planning: Destination: PT-Home ,OT-   , SLP-Home Projected Follow-up: PT-24 hour supervision/assistance;Home health PT, OT-  24 hour supervision/assistance, SLP-Home Health SLP;Outpatient SLP;24 hour supervision/assistance Projected Equipment Needs: PT-To be determined, OT- To be determined, SLP-None recommended by SLP Equipment Details: PT-Unsure if patient owns any DME (secondary to language barrier and cognitive deficits-will need to verify with family); recommendations TBD upon discharge, OT-  Patient/family involved in discharge planning: PT- Patient,  OT-Patient unable/family or caregiver not available, SLP-Patient;Family member/caregiver  MD ELOS: 8-10 days Medical Rehab Prognosis:  Excellent Assessment: The patient has been admitted for CIR therapies. The team will be addressing functional mobility, strength, stamina, balance, safety, adaptive techniques and equipment,  self-care, bowel and bladder mgt, patient and caregiver education, NMR, cognitive perceptual awareness, pain mgt, CV tolerance. Goals have been set at supervision to mod I for mobility and self-care tasks and min assist for cognition.    Ranelle OysterZachary T. Swartz, MD, FAAPMR      See Team Conference Notes for weekly updates to the plan of care

## 2013-07-22 NOTE — Progress Notes (Signed)
Occupational Therapy Session Note  Patient Details  Name: [name] MRN: [id number] Date of Birth: 1934-02-13  Today's Date: 07/22/2013 Time: 1000-1100 Time Calculation (min): 60 min Pain:  none  Short Term Goals: Week 1:  OT Short Term Goal 1 (Week 1): STG=LTG due to anticipated brief length of stay  Skilled Therapeutic Interventions/Progress Updates:    Engaged in therapeutic bathing and dressing at shower level.  Addressed balance, functional mobility,UE strength and ROM.  Pt. Lying in bed.  Went from supine to sit with SBA.  Ambulated with RW to shower and sat to doff footies and gown.  Pt. Stood for 90 % of shower and was able to balance with SBA.  Pt. Only had gowns so no clothes available today.  Left in wc with Pt ready for next session.      Therapy Documentation Precautions:  Precautions Precautions: Fall Precaution Comments: spanish speaking, poor safety awareness Restrictions Weight Bearing Restrictions: No General:   Vital Signs:   Pain: Pain Assessment Pain Assessment: No/denies pain ADL: ADL ADL Comments: see FIM      See FIM for current functional status  Therapy/Group: Individual Therapy  Humberto Sealslizabeth J Imanie Darrow 07/22/2013, 11:01 AM

## 2013-07-22 NOTE — Progress Notes (Signed)
Physical Therapy Session Note  Patient Details  Name: [name] MRN: [id number] Date of Birth: 11-12-1933  Today's Date: 07/22/2013 Time: 1100-1200 and 1300-1330 Time Calculation (min): 60 min and 30 min  Short Term Goals: Week 1:  PT Short Term Goal 1 (Week 1): STGs=LTGs due to ELOS  Skilled Therapeutic Interventions/Progress Updates:   AM Session: Focus on safety with AD during gait training and transfers, stair negotiation, increasing activity tolerance, and LE strengthening. Pt received sitting in w/c, interpreter present for session. Gait training room > gym >150 ft and gym > RN station 120 ft using RW and min guard-minA in controlled environment. Gait training 25 ft using RW with min A in home environment. Pt required max verbal cues for keeping RW close to body and forward gaze/upright head posture with little carryover throughout session. Stair negotiation up/down 5 stairs x 1 using 2 rails and x 1 using L rail and min assist, with step-over-step pattern ascending/step-to pattern descending. Pt requested crackers for snack and pt educated on role of SLP and diet; RN station requested appropriate snack for pt but did not receive during session. NMR for dynamic standing balance without UE support and attempted ball toss; however, pt unable to tolerate and requested supine rest due to neck pain (see below). Pt transferred sit<>supine on mat with supervision and reported improvement in neck pain. Pt education provided on benefits of upright head and neck posture to decrease pain and how increased dependence on UEs using RW may contribute to neck pain. Pt performed bridging for strengthening x 10 with 3-5 sec hold with verbal cues for proper sequencing and breathing. Pt returned to RN station and left sitting in w/c awaiting lunch.   PM Session: Pt received sitting in w/c at RN station. Gait training RN station <> gym 120 ft x 2 using RW and min guard in controlled environment, mod verbal  cues for upright head posture and keeping RW close to body with pt self-correcting with decreased need for cues throughout session. From morning session, pt remains unable to attempt standing activities without use of RW for UE support due to increase in neck pain, 7/10; rest and repositioned. NuStep BLEs only for LE strengthening Level 3 x 15 min total with frequent rest breaks due to fatigue. Pt returned to w/c at RN station and transported to room, requesting to get in bed. Stand pivot transfer using RW min guard and sit > supine supervision, all needs within reach, bed alarm on.   Therapy Documentation Precautions:  Precautions Precautions: Fall Precaution Comments: spanish speaking, poor safety awareness Restrictions Weight Bearing Restrictions: No Pain: Pain Assessment Pain Assessment: 0-10 Pain Score: 5  Pain Type: Chronic pain Pain Location: Neck Pain Orientation: Lower Pain Descriptors / Indicators: Discomfort;Sore;Aching Pain Intervention(s): Repositioned;Rest Locomotion : Ambulation Ambulation/Gait Assistance: 4: Min guard;4: Min assist   See FIM for current functional status  Therapy/Group: Individual Therapy  Kerney ElbeRebecca A Varner 07/22/2013, 12:16 PM

## 2013-07-22 NOTE — Progress Notes (Signed)
[facility] PHYSICAL MEDICINE & REHABILITATION     PROGRESS NOTE    Subjective/Complaints: Sitting EOB--wants to get in shower. Sleep was fair last night. Denies pain.  Objective: Vital Signs: Blood pressure 110/45, pulse 65, temperature 97.6 F (36.4 C), temperature source Oral, resp. rate 16, weight 62 kg (136 lb 11 oz), SpO2 98.00%. No results found.  Recent Labs  07/20/13 0500 07/20/13 2010  WBC 10.0 10.9*  HGB 8.0* 9.6*  HCT 23.7* 29.1*  PLT 295 340    Recent Labs  07/20/13 0500 07/20/13 2010  NA 134*  --   K 3.9  --   CL 90*  --   GLUCOSE 239*  --   BUN 70*  --   CREATININE 9.47* 5.09*  CALCIUM 7.7*  --    CBG (last 3)   Recent Labs  07/21/13 2354 07/22/13 0401 07/22/13 0739  GLUCAP 158* 240* 164*    Wt Readings from Last 3 Encounters:  07/22/13 62 kg (136 lb 11 oz)  07/20/13 60.9 kg (134 lb 4.2 oz)  07/20/13 60.9 kg (134 lb 4.2 oz)    Physical Exam:  HENT: oral mucosa pink and moist Head: Normocephalic.  Eyes: EOM are normal.  Neck: Normal range of motion. Neck supple. No thyromegaly present.  Cardiovascular: Normal rate and regular rhythm.  Respiratory: Effort normal and breath sounds normal. No respiratory distress.  GI: Soft. Bowel sounds are normal. He exhibits no distension.  Neurological: He is alert. No cranial nerve deficit. He exhibits normal muscle tone.  Patient speaks little english--able to communicate basic needs. He made good eye contact. very alert.  Moves all 4's. UE 3+ to 4/5 prox to distal. LE 3/5 HF, KE and 4/5 ankles. Senses pain in all 4's Skin: Skin is warm and dry. Ext: no thrill thru AVG, minimal sound with auscultation Psychiatric: He has a normal mood and affect. Non-agitated. Not restless   Assessment/Plan: 1. Functional deficits secondary to deconditioning and encephalopathy which require 3+ hours per day of interdisciplinary therapy in a comprehensive inpatient rehab setting. Physiatrist is providing close team  supervision and 24 hour management of active medical problems listed below. Physiatrist and rehab team continue to assess barriers to discharge/monitor patient progress toward functional and medical goals. FIM: FIM - Bathing Bathing: 0: Activity did not occur (refused shower)  FIM - Upper Body Dressing/Undressing Upper body dressing/undressing: 0: Wears gown/pajamas-no public clothing FIM - Lower Body Dressing/Undressing Lower body dressing/undressing: 0: Wears gown/pajamas-no public clothing  FIM - Toileting Toileting steps completed by patient: Adjust clothing prior to toileting;Performs perineal hygiene;Adjust clothing after toileting Toileting: 5: Set-up assist to: Obtain supplies  FIM - Diplomatic Services operational officerToilet Transfers Toilet Transfers Assistive Devices: Grab bars Toilet Transfers: 4-To toilet/BSC: Min A (steadying Pt. > 75%);4-From toilet/BSC: Min A (steadying Pt. > 75%)  FIM - Bed/Chair Transfer Bed/Chair Transfer Assistive Devices: Arm rests Bed/Chair Transfer: 4: Supine > Sit: Min A (steadying Pt. > 75%/lift 1 leg);4: Sit > Supine: Min A (steadying pt. > 75%/lift 1 leg);4: Bed > Chair or W/C: Min A (steadying Pt. > 75%);4: Chair or W/C > Bed: Min A (steadying Pt. > 75%)  FIM - Locomotion: Wheelchair Distance: 40 Locomotion: Wheelchair: 1: Travels less than 50 ft with minimal assistance (Pt.>75%) FIM - Locomotion: Ambulation Locomotion: Ambulation Assistive Devices: Designer, industrial/productWalker - Rolling Ambulation/Gait Assistance: 4: Min assist Locomotion: Ambulation: 4: Travels 150 ft or more with minimal assistance (Pt.>75%)  Comprehension Comprehension Mode: Auditory Comprehension: 3-Understands basic 50 - 74% of the time/requires cueing  25 - 50%  of the time  Expression Expression Mode: Verbal Expression: 4-Expresses basic 75 - 89% of the time/requires cueing 10 - 24% of the time. Needs helper to occlude trach/needs to repeat words.  Social Interaction Social Interaction: 2-Interacts appropriately 25  - 49% of time - Needs frequent redirection.  Problem Solving Problem Solving: 2-Solves basic 25 - 49% of the time - needs direction more than half the time to initiate, plan or complete simple activities  Memory Memory Assistive Devices: Other (Comment) Memory: 2-Recognizes or recalls 25 - 49% of the time/requires cueing 51 - 75% of the time  Medical Problem List and Plan:  1. Deconditioning related to multiple medical issues/encephalopathy  2. DVT Prophylaxis/Anticoagulation: Subcutaneous heparin.    3. Pain Management: Tylenol as needed.   4. Mood/encephalopathy: Continue Risperdal twice a day   -remains impulsive. 5. Neuropsych: This patient ? capable of making decisions on ? own behalf.  6. End-stage renal disease with hemodialysis. Followup per renal services  7.NSTEMI. Continue aspirin/ Plavix/Imdur and Coreg 12.5 mg twice a day. Recent cardiac catheterization 3 vessel CAD. Continue medical management---asymptomatic at present 8. Chronic anemia. Continue Aranesp  9. Positive Clostridium difficile. Continue Flagyl initiated 07/18/2012 x14 days total. Contact precautions  10. Diabetes mellitus with peripheral neuropathy. 70/30 insulin resumed at 15u bid yesterday---titrate further as needed.    -Continue sliding scale insulin covg as well 11. Hyperlipidemia. Lipitor  LOS (Days) 2 A FACE TO FACE EVALUATION WAS PERFORMED  Ranelle OysterZachary T Shaleen Talamantez 07/22/2013 7:51 AM

## 2013-07-22 NOTE — Progress Notes (Signed)
Hypoglycemic Event  CBG: 59  Treatment: 15 GM carbohydrate snack  Symptoms: None  Follow-up CBG: Time:1702 CBG Result:96  Possible Reasons for Event: Unknown  Comments/MD notified:dr swartz.     [name]  Remember to initiate Hypoglycemia Order Set & complete

## 2013-07-23 LAB — GLUCOSE, CAPILLARY
GLUCOSE-CAPILLARY: 212 mg/dL — AB (ref 70–99)
GLUCOSE-CAPILLARY: 265 mg/dL — AB (ref 70–99)
GLUCOSE-CAPILLARY: 286 mg/dL — AB (ref 70–99)
GLUCOSE-CAPILLARY: 292 mg/dL — AB (ref 70–99)
GLUCOSE-CAPILLARY: 86 mg/dL (ref 70–99)
Glucose-Capillary: 142 mg/dL — ABNORMAL HIGH (ref 70–99)

## 2013-07-23 MED ORDER — RISPERIDONE 0.5 MG PO TABS
0.5000 mg | ORAL_TABLET | Freq: Every evening | ORAL | Status: DC | PRN
  Filled 2013-07-23: qty 1

## 2013-07-23 MED ORDER — INSULIN ASPART PROT & ASPART (70-30 MIX) 100 UNIT/ML ~~LOC~~ SUSP
8.0000 [IU] | Freq: Two times a day (BID) | SUBCUTANEOUS | Status: DC
  Administered 2013-07-23 (×2): 8 [IU] via SUBCUTANEOUS

## 2013-07-23 NOTE — Progress Notes (Signed)
The patient became confused during the night and was put at the nurse's station on two separate occasions.   An interpreter was used via telephone to try to reorient the patient and the patient's daughter was called during the night as well.  The patient's daughter talked with him at length and their conversation seemed to reorient him somewhat.  His CBG crept up to 265 this morning.

## 2013-07-23 NOTE — Progress Notes (Signed)
[facility] PHYSICAL MEDICINE & REHABILITATION     PROGRESS NOTE    Subjective/Complaints: Up last night, restless and confused---at RN station when I arrived..  Objective: Vital Signs: Blood pressure 110/58, pulse 66, temperature 98 F (36.7 C), temperature source Oral, resp. rate 16, weight 60.4 kg (133 lb 2.5 oz), SpO2 98.00%. No results found.  Recent Labs  07/20/13 2010 07/22/13 1752  WBC 10.9* 9.6  HGB 9.6* 8.6*  HCT 29.1* 25.8*  PLT 340 321    Recent Labs  07/20/13 2010 07/22/13 1752  NA  --  131*  K  --  4.2  CL  --  88*  GLUCOSE  --  144*  BUN  --  74*  CREATININE 5.09* 9.46*  CALCIUM  --  7.8*   CBG (last 3)   Recent Labs  07/22/13 2234 07/23/13 0016 07/23/13 0345  GLUCAP 175* 212* 265*    Wt Readings from Last 3 Encounters:  07/22/13 60.4 kg (133 lb 2.5 oz)  07/20/13 60.9 kg (134 lb 4.2 oz)  07/20/13 60.9 kg (134 lb 4.2 oz)    Physical Exam:  HENT: oral mucosa pink and moist Head: Normocephalic.  Eyes: EOM are normal.  Neck: Normal range of motion. Neck supple. No thyromegaly present.  Cardiovascular: Normal rate and regular rhythm.  Respiratory: Effort normal and breath sounds normal. No respiratory distress.  GI: Soft. Bowel sounds are normal. He exhibits no distension.  Neurological: He is alert. No cranial nerve deficit. He exhibits normal muscle tone.  Alert confused. Follows simple commands Moves all 4's. UE 3+ to 4/5 prox to distal. LE 3/5 HF, KE and 4/5 ankles. Senses pain in all 4's Skin: Skin is warm and dry. Ext: no thrill thru AVG, minimal sound with auscultation Psychiatric: He has a normal mood and affect. Non-agitated. pleasant   Assessment/Plan: 1. Functional deficits secondary to deconditioning and encephalopathy which require 3+ hours per day of interdisciplinary therapy in a comprehensive inpatient rehab setting. Physiatrist is providing close team supervision and 24 hour management of active medical problems listed  below. Physiatrist and rehab team continue to assess barriers to discharge/monitor patient progress toward functional and medical goals. FIM: FIM - Bathing Bathing Steps Patient Completed: Chest;Right Arm;Left Arm;Abdomen;Front perineal area;Buttocks;Right upper leg;Left upper leg;Right lower leg (including foot);Left lower leg (including foot) Bathing: 4: Steadying assist  FIM - Upper Body Dressing/Undressing Upper body dressing/undressing: 0: Wears gown/pajamas-no public clothing FIM - Lower Body Dressing/Undressing Lower body dressing/undressing steps patient completed: Don/Doff right sock Lower body dressing/undressing: 0: Wears gown/pajamas-no public clothing  FIM - Toileting Toileting steps completed by patient: Adjust clothing prior to toileting;Performs perineal hygiene;Adjust clothing after toileting Toileting: 5: Set-up assist to: Obtain supplies  FIM - Diplomatic Services operational officerToilet Transfers Toilet Transfers Assistive Devices: Grab bars Toilet Transfers: 4-To toilet/BSC: Min A (steadying Pt. > 75%);4-From toilet/BSC: Min A (steadying Pt. > 75%)  FIM - BankerBed/Chair Transfer Bed/Chair Transfer Assistive Devices: Walker;Arm rests Bed/Chair Transfer: 4: Bed > Chair or W/C: Min A (steadying Pt. > 75%);4: Chair or W/C > Bed: Min A (steadying Pt. > 75%);5: Sit > Supine: Supervision (verbal cues/safety issues);5: Supine > Sit: Supervision (verbal cues/safety issues)  FIM - Locomotion: Wheelchair Distance: 40 Locomotion: Wheelchair: 1: Total Assistance/staff pushes wheelchair (Pt<25%) FIM - Locomotion: Ambulation Locomotion: Ambulation Assistive Devices: Designer, industrial/productWalker - Rolling Ambulation/Gait Assistance: 4: Min guard;4: Min assist Locomotion: Ambulation: 4: Travels 150 ft or more with minimal assistance (Pt.>75%)  Comprehension Comprehension Mode: Auditory Comprehension: 3-Understands basic 50 - 74% of the  time/requires cueing 25 - 50%  of the time  Expression Expression Mode: Verbal Expression: 4-Expresses  basic 75 - 89% of the time/requires cueing 10 - 24% of the time. Needs helper to occlude trach/needs to repeat words.  Social Interaction Social Interaction: 2-Interacts appropriately 25 - 49% of time - Needs frequent redirection.  Problem Solving Problem Solving: 2-Solves basic 25 - 49% of the time - needs direction more than half the time to initiate, plan or complete simple activities  Memory Memory Assistive Devices: Other (Comment) Memory: 2-Recognizes or recalls 25 - 49% of the time/requires cueing 51 - 75% of the time  Medical Problem List and Plan:  1. Deconditioning related to multiple medical issues/encephalopathy  2. DVT Prophylaxis/Anticoagulation: Subcutaneous heparin.    3. Pain Management: Tylenol as needed.   4. Mood/encephalopathy: Continue Risperdal twice a day   -added an HS dose prn for agitation  -remains impulsive. 5. Neuropsych: This patient ? capable of making decisions on ? own behalf.  6. End-stage renal disease with hemodialysis. Followup per renal services  7.NSTEMI. Continue aspirin/ Plavix/Imdur and Coreg 12.5 mg twice a day. Recent cardiac catheterization 3 vessel CAD. Continue medical management---asymptomatic at present 8. Chronic anemia. Continue Aranesp  9. Positive Clostridium difficile. Continue Flagyl initiated 07/18/2012 x14 days total. Contact precautions  10. Diabetes mellitus with peripheral neuropathy. 70/30 insulin resumed at 15u bid yesterday---sugars bottomed out  -decrease to 8u bid  -Continue sliding scale insulin covg as well 11. Hyperlipidemia. Lipitor  LOS (Days) 3 A FACE TO FACE EVALUATION WAS PERFORMED  Ranelle OysterZachary T Shyonna Carlin 07/23/2013 7:45 AM

## 2013-07-23 NOTE — Plan of Care (Signed)
Problem: RH SAFETY Goal: RH STG ADHERE TO SAFETY PRECAUTIONS W/ASSISTANCE/DEVICE STG Adhere to Safety Precautions With Assistance/Device. Mod I  Outcome: Not Progressing Frequent out of bed without assist

## 2013-07-24 ENCOUNTER — Inpatient Hospital Stay (HOSPITAL_COMMUNITY): Payer: Medicare Other | Admitting: *Deleted

## 2013-07-24 ENCOUNTER — Inpatient Hospital Stay (HOSPITAL_COMMUNITY): Payer: Medicare Other | Admitting: Occupational Therapy

## 2013-07-24 ENCOUNTER — Inpatient Hospital Stay (HOSPITAL_COMMUNITY): Payer: Medicare Other

## 2013-07-24 ENCOUNTER — Encounter (HOSPITAL_COMMUNITY): Payer: Medicare Other | Admitting: Occupational Therapy

## 2013-07-24 DIAGNOSIS — R5381 Other malaise: Secondary | ICD-10-CM

## 2013-07-24 LAB — GLUCOSE, CAPILLARY
Glucose-Capillary: 241 mg/dL — ABNORMAL HIGH (ref 70–99)
Glucose-Capillary: 143 mg/dL — ABNORMAL HIGH (ref 70–99)
Glucose-Capillary: 112 mg/dL — ABNORMAL HIGH (ref 70–99)
Glucose-Capillary: 202 mg/dL — ABNORMAL HIGH (ref 70–99)

## 2013-07-24 MED ORDER — INSULIN ASPART PROT & ASPART (70-30 MIX) 100 UNIT/ML ~~LOC~~ SUSP
15.0000 [IU] | Freq: Every day | SUBCUTANEOUS | Status: DC
  Administered 2013-07-24 – 2013-07-26 (×3): 15 [IU] via SUBCUTANEOUS

## 2013-07-24 NOTE — Progress Notes (Signed)
Occupational Therapy Session Note  Patient Details  Name: [name] MRN: [id number] Date of Birth: 1933/09/10  Today's Date: 07/24/2013 Time: 0800-0848 Time Calculation (min): 48 min  Short Term Goals: Week 1:  OT Short Term Goal 1 (Week 1): STG=LTG due to anticipated brief length of stay  Skilled Therapeutic Interventions/Progress Updates:    Patient seen this am to address safety, functional mobility and activity tolerance during basic self care skills.  Patient pleasant and cooperative.  Ambulated to bathroom with rolling walker.  Stood for entire shower, able to reach safely to floor to retrieve washcloth, spontaneously stabilizing self on grab bar.  Patient ambulating within room with cues for walker safety.  Patient with tendency to walk away from walker when target in sight.  Patient requesting to return to bed after dressing and grooming.  Patient almost immediately sleeping upon supine.  Patient missed last 12 minutes due to fatigue.    Therapy Documentation Precautions:  Precautions Precautions: Fall Precaution Comments: spanish speaking, poor safety awareness Restrictions Weight Bearing Restrictions: No General: General Amount of Missed OT Time (min): 12 Minutes  Pain:  No report of pain ADL: ADL ADL Comments: see FIM  See FIM for current functional status  Therapy/Group: Individual Therapy  Collier SalinaKristin M Gareth Fitzner 07/24/2013, 8:53 AM

## 2013-07-24 NOTE — Progress Notes (Signed)
[facility] Individual Statement of Services  Patient Name:  [name]  Date:  07/24/2013  Welcome to the [facility].  Our goal is to provide you with an individualized program based on your diagnosis and situation, designed to meet your specific needs.  With this comprehensive rehabilitation program, you will be expected to participate in at least 3 hours of rehabilitation therapies Monday-Friday, with modified therapy programming on the weekends.  Your rehabilitation program will include the following services:  Physical Therapy (PT), Occupational Therapy (OT), Speech Therapy (ST), 24 hour per day rehabilitation nursing, Therapeutic Recreation (TR), Case Management (Social Worker), Rehabilitation Medicine, Nutrition Services and Pharmacy Services  Weekly team conferences will be held on Tuesdays to discuss your progress.  Your Social Worker will talk with you frequently to get your input and to update you on team discussions.  Team conferences with you and your family in attendance may also be held.  Expected length of stay: 7 to 9 days  Overall anticipated outcome: Supervision  Depending on your progress and recovery, your program may change. Your Social Worker will coordinate services and will keep you informed of any changes. Your Social Worker's name and contact numbers are listed  below.  The following services may also be recommended but are not provided by the [facility]:   Driving Evaluations  Home Health Rehabiltiation Services  Outpatient Rehabilitation Services   Arrangements will be made to provide these services after discharge if needed.  Arrangements include referral to agencies that provide these services.  Your insurance has been verified to be:  Medicare/Medicaid Your primary doctor is:  Dr. Charlott RakesFrancisco Hodges  Pertinent information will be shared with your doctor and your insurance company.  Social  Worker:  Staci AcostaJenny Gwenetta Devos, LCSW  [phone] or [phone]  Information discussed with and copy given to patient by: Vista DeckJennifer Capps Charleene Callegari, 07/24/2013, 1:47 PM

## 2013-07-24 NOTE — Progress Notes (Signed)
Physical Therapy Session Note  Patient Details  Name: [name] MRN: [id number] Date of Birth: Jan 26, 1934  Today's Date: 07/24/2013 Time: 1130-1200 Time Calculation (min): 30 min  Short Term Goals: Week 1:  PT Short Term Goal 1 (Week 1): STGs=LTGs due to ELOS  Skilled Therapeutic Interventions/Progress Updates:    Patient received supine in bed, interpreter present. Session focused on functional transfers, gait training, and stair negotiation. Discussion with patient via interpreter about who primary caregiver will be and patient's anticipated needs of 24/7 supervision. Patient names list of people who "can help", but then states that these people will be returning to their homes [place]in [place] or going out of the country (to [place] etc. Patient appears confused about discharge recommendations and ability of various "caregivers" to assist.   Gait training 175' x2 with RW and supervision in controlled environment, verbal cues for RW management/safety. Gait training without AD and min guard x100'. Stair negotiation x5 with one handrail and min guard progressing to close supervision. Bed mobility activities: emphasis on supine>sit via log rolling technique, requires tactile cues for sequencing and demonstrates poor carry over. Patient returned to room and left semi-reclined in bed with bed alarm on and all needs within reach.  Therapy Documentation Precautions:  Precautions Precautions: Fall Precaution Comments: spanish speaking, poor safety awareness Restrictions Weight Bearing Restrictions: No Pain: Pain Assessment Pain Assessment: No/denies pain Pain Score: 0-No pain Faces Pain Scale: No hurt Locomotion : Ambulation Ambulation/Gait Assistance: 5: Supervision   See FIM for current functional status  Therapy/Group: Individual Therapy  Chipper HerbBridget S Kashae Carstens S. Srishti Strnad, PT, DPT 07/24/2013, 12:01 PM

## 2013-07-24 NOTE — Progress Notes (Signed)
Occupational Therapy Session Note  Patient Details  Name: [name] MRN: [id number] Date of Birth: 22-Aug-1933  Today's Date: 07/24/2013 Time: [id number] Time Calculation (min): 45 min  Short Term Goals: Week 1:  OT Short Term Goal 1 (Week 1): STG=LTG due to anticipated brief length of stay  Skilled Therapeutic Interventions/Progress Updates:    Patient seen this pm for OT intervention to address activity tolerance, balance, and functional mobility.  Patient sound asleep upon arrival, although awoke easily.  Patient agreeable to therapy, although lethargic.  Walked to apartment with rolling walker and supervision.  Patient reliant on walker for transitions sit to/from stand.  Practiced various functional transfers, e.g.  getting up from floor, getting into and out out of tub shower, getting into and out of bed, and transitioning repeatedly from sit to stand, and squat to stand with decreased use of upper extremities.  Patient clearly fatigued after  45 minutes.  Family member indicated he has not had any uninterrupted rest periods.  Patient taken back to room and placed back into bed where he promptly fell back to sleep.    Therapy Documentation Precautions:  Precautions Precautions: Fall Precaution Comments: spanish speaking, poor safety awareness Restrictions Weight Bearing Restrictions: No General: General Amount of Missed OT Time (min): 15 Minutes   Pain: Pain Assessment Pain Assessment: No/denies pain Pain Score: 0-No pain ADL: ADL ADL Comments: see FIM  See FIM for current functional status  Therapy/Group: Individual Therapy  Collier SalinaKristin M Gellert 07/24/2013, 3:21 PM

## 2013-07-24 NOTE — Progress Notes (Signed)
Speech Language Pathology Daily Session Note  Patient Details  Name: [name] MRN: [id number] Date of Birth: 1934-02-25  Today's Date: 07/24/2013 Time: 1000-1045 Time Calculation (min): 45 min  Short Term Goals: Week 1: SLP Short Term Goal 1 (Week 1): Pt will utilize safe swallowing strategies with recommended textures with Min cues SLP Short Term Goal 2 (Week 1): Pt will demonstrate adequate mastication and oral clearance with trials of Dys 3 textures with Min cues SLP Short Term Goal 3 (Week 1): Pt will use environmental cues to demonstrate orientation x4 with Mod cues SLP Short Term Goal 4 (Week 1): Pt will sustain attention to functional familiar task for at least 15 min with Min cues SLP Short Term Goal 5 (Week 1): Pt will utilize external memory aids to recall new/daily information with Min cues SLP Short Term Goal 6 (Week 1): Pt will maintain topic of conversation across 3 conversational turns with Min cues  Skilled Therapeutic Interventions: Skilled treatment focused on cognitive and swallowing goals with interpreter present. SLP facilitated session with observation of PO trials consisting of Dys 3 textures, which pt consumed with supervision. Recommend to trial a tray with SLP prior to advancement to Dys 3 due to what appears to be a more cognitively based dysphagia. Pt was oriented to location, although required Max cues for situation due repeatedly talking about "mistakes" that he has made. Pt required Mod cues for redirection throughout structured cognitive task as well as Max cues for mental flexibility. Continue plan of care.   FIM:  Comprehension Comprehension Mode: Auditory Comprehension: 3-Understands basic 50 - 74% of the time/requires cueing 25 - 50%  of the time Expression Expression Mode: Verbal Expression: 5-Expresses basic 90% of the time/requires cueing < 10% of the time. Social Interaction Social Interaction: 5-Interacts appropriately 90% of the time -  Needs monitoring or encouragement for participation or interaction. Problem Solving Problem Solving: 3-Solves basic 50 - 74% of the time/requires cueing 25 - 49% of the time Memory Memory: 2-Recognizes or recalls 25 - 49% of the time/requires cueing 51 - 75% of the time FIM - Eating Eating Activity: 5: Supervision/cues  Pain Pain Assessment Pain Assessment: No/denies pain Pain Score: 0-No pain Faces Pain Scale: No hurt  Therapy/Group: Individual Therapy   Maxcine HamLaura Paiewonsky, M.A. CCC-SLP [phone]  Maxcine HamLaura Paiewonsky 07/24/2013, 12:03 PM

## 2013-07-24 NOTE — Progress Notes (Signed)
Subjective: No complaints, feeling "OK", sleeping good, walking with PT  Filed Vitals:   07/22/13 2227 07/23/13 0529 07/24/13 0500 07/24/13 0630  BP: 150/50 110/58  128/63  Pulse: 81 66  60  Temp: 97.6 F (36.4 C) 98 F (36.7 C)  97.7 F (36.5 C)  TempSrc: Axillary Oral  Oral  Resp: 16 16  18   Weight:   59.7 kg (131 lb 9.8 oz)   SpO2: 99% 98%  100%   Exam: Elderly pleasant male in no distress, converses appropriately No jvd Chest clear bilat RRR 2/6 SEM, no RG Abd soft, NTND No LE or UE edema LUA AVF patent Neuro is Ox3, nonfocal  Dialysis: MWF [facility]  3h 15min   67kg    2/2.5 Bath  P4    LUA AVF    Heparin 2000  Epo 6200    Venofer 100 /wk on Wed  Assessment: 1 Debility- on rehab, improving 2 Pulm edema / resp failure- resolved 3 ESRD 4 HTN/volume- new dry wt ~59kg 5 Anemia on aranesp 6 MBD no binders or vit D 7 NSTEMI / 3V CAD s/p PCI x 2- on BB, asa, plavix, Imdur; not surgical candidate 8 Cdif on flagyl po 9 DM2 10 Psych- on risperdal   Plan- HD today    Vinson Moselleob Braxson Hollingsworth MD  pager [phone]    cell [phone]  07/24/2013, 9:51 AM     Recent Labs Lab 07/17/13 1240 07/20/13 0500 07/20/13 2010 07/22/13 1752  NA 134* 134*  --  131*  K 4.0 3.9  --  4.2  CL 92* 90*  --  88*  CO2 22 22  --  22  GLUCOSE 196* 239*  --  144*  BUN 78* 70*  --  74*  CREATININE 11.74* 9.47* 5.09* 9.46*  CALCIUM 7.6* 7.7*  --  7.8*  PHOS  --  3.6  --  4.4    Recent Labs Lab 07/20/13 0500 07/22/13 1752  ALBUMIN 2.8* 2.8*    Recent Labs Lab 07/20/13 0500 07/20/13 2010 07/22/13 1752  WBC 10.0 10.9* 9.6  HGB 8.0* 9.6* 8.6*  HCT 23.7* 29.1* 25.8*  MCV 94.8 96.0 93.8  PLT 295 340 321   . antiseptic oral rinse  15 mL Mouth Rinse QID  . aspirin  81 mg Per Tube Daily  . atorvastatin  40 mg Oral q1800  . carvedilol  12.5 mg Oral BID WC  . chlorhexidine  15 mL Mouth Rinse BID  . clopidogrel  75 mg Oral Q breakfast  . darbepoetin (ARANESP) injection - DIALYSIS  40  mcg Intravenous Q Mon-HD  . famotidine  20 mg Per Tube Daily  . feeding supplement (NEPRO CARB STEADY)  237 mL Oral BID BM  . heparin  5,000 Units Subcutaneous 3 times per day  . insulin aspart  0-9 Units Subcutaneous TID WC & HS  . insulin aspart protamine- aspart  15 Units Subcutaneous Q breakfast  . isosorbide mononitrate  30 mg Oral Daily  . metroNIDAZOLE  500 mg Oral 3 times per day  . multivitamin  1 tablet Oral QHS  . risperiDONE  0.5 mg Oral BID     acetaminophen, ondansetron (ZOFRAN) IV, ondansetron, pentafluoroprop-tetrafluoroeth, risperiDONE, sorbitol

## 2013-07-24 NOTE — Progress Notes (Signed)
Social Work Assessment and Plan  Patient Details  Name: [name] MRN: [id number] Date of Birth: [birth year]  Today's Date: 07/24/2013  Problem List:  Patient Active Problem List   Diagnosis Date Noted  . Physical deconditioning 07/20/2013  . Non-ST elevation myocardial infarction (NSTEMI), initial episode of care 07/18/2013  . Acute encephalopathy 07/09/2013  . ESRD on dialysis 07/09/2013  . Unspecified essential hypertension 07/09/2013  . Type II or unspecified type diabetes mellitus with peripheral circulatory disorders, uncontrolled(250.72) 07/09/2013  . Acute respiratory failure 07/09/2013  . PVD (peripheral vascular disease) 05/15/2013  . Aftercare following surgery of the circulatory system, [place] 05/15/2013  . Peripheral vascular disease, unspecified 11/03/2011   Past Medical History:  Past Medical History  Diagnosis Date  . Diabetes mellitus   . Hypertension   . Hyperlipidemia   . Glaucoma   . Cataract   . Peripheral vascular disease   . ESRD on dialysis    Past Surgical History:  Past Surgical History  Procedure Laterality Date  . Pr vein bypass graft,aorto-fem-pop  02/20/2010    left fem pop with saphenous vein  . Av fistula placement  10/24/2009    left   Social History:  reports that he quit smoking about 37 years ago. He quit smokeless tobacco use about 26 years ago. He reports that he does not drink alcohol or use illicit drugs.  Family / Support Systems Marital Status: Divorced Patient Roles: Parent;Other (Comment) (5 children; communicates with 2 of them; friends) Children: Christy Sartorius - son in [place] [phone]; Lodema Pilot in [place] Other Supports: Alex Gardener - friend [phone] Anticipated Caregiver: Alex Gardener - friend/caregiver; Christy Sartorius - son Ability/Limitations of Caregiver: Lorita Officer for [place] on May 22.  ? if Christy Sartorius would assume caregiving roles at that time Caregiver Availability: Other (Comment) (CSW trying to  connect with caregivers to determine if he has 24/7 supervision) Family Dynamics: Pt's sister is here from [place], but returns there on Wednesday.  Pt's dtr to return to [place].  Son is in [place].  Pt's friend, Verdis Frederickson, will be leaving for [place] on May 22 for an unknown length of time.  Social History Preferred language: Spanish Religion: Catholic Cultural Background: Pt is Spanish speaking Employment Status: Retired Date Retired/Disabled/Unemployed: Pt worked in [employer], forensic Issues: None reported Guardian/Conservator: None   Abuse/Neglect Physical Abuse: Denies Verbal Abuse: Denies Sexual Abuse: Denies Exploitation of patient/patient's resources: Denies Self-Neglect: Denies  Emotional Status Pt's affect, behavior and adjustment status: Pt was upbeat and agreeable to CSW visit and is glad to be receiving therapy. Recent Psychosocial Issues: Pt has had some confusion/delirium in the hospital per chart review.   Pyschiatric History: None reported prior to admission. Substance Abuse History: None reported  Patient / Family Perceptions, Expectations & Goals Pt/Family understanding of illness & functional limitations: Pt seemed to express understanding that he will need 24/7 supervision.  Pt also noted that he'd like a letter stating that he needs to be on the first floor at his apartment complex. Premorbid pt/family roles/activities: Pt reports being independent prior to admission.  Pt was going to dialysis 3 times a week and takes the bus there and back. Anticipated changes in roles/activities/participation: unknown until CSW can confirm with Verdis Frederickson Pt/family expectations/goals: CSW was not able to determine this with pt.  He plans to go home.  Community Resources Express Scripts: None Premorbid Home Care/DME Agencies: None Transportation available at discharge: friends/son  Discharge Planning Living Arrangements: [place]:  Friends/neighbors;Children Type of Residence: Private residence Insurance Resources: Medicare;Medicaid (specify county) Theme park manager) Financial Resources: Social Security Living Expenses: Rent Money Management: Patient Does the patient have any problems obtaining your medications?: No Home Management: Pt with his family/friends Patient/Family Preliminary Plans: Pt will go home and have supervision by Verdis Frederickson, friend, and then his son, Christy Sartorius when Verdis Frederickson goes to [place] on 08-25-13 Barriers to Discharge: Steps;Self care;Family Support Social Work Anticipated Follow Up Needs: HH/OP Expected length of stay: 7-9 days  Clinical Impression CSW met with pt twice, once with just interpreter and a second time (07-24-13) with pt's sister from [place] and two friends to introduce self and role of CSW, as well as complete assessment.  Pt lives in [place] and has assistance from a friend, Verdis Frederickson, who will be caring for pt when he goes home, but she is due to go to [place] on 08-25-13.  CSW was able to talk with Verdis Frederickson via telephone on 07-25-13 and then in person in pt's room with sister and pt's dtr, [name].  Family is still trying to decide if pt will stay here in [place], go with dtr to [place], or go to [place] with his extended family.  Pt has extensive medical needs, including dialysis, so dtr prefers pt stay in the [place].  CSW encouraged them to discuss and decide soon, as pt's d/c date will most likely be this week.  CSW will follow up with family after team conference.  Silvestre Mesi Shavonna Corella 07/24/2013, 2:16 PM

## 2013-07-24 NOTE — Progress Notes (Signed)
[facility] PHYSICAL MEDICINE & REHABILITATION     PROGRESS NOTE    Subjective/Complaints: Up last night, restless and confused---at RN station when I arrived..  Objective: Vital Signs: Blood pressure 128/63, pulse 60, temperature 97.7 F (36.5 C), temperature source Oral, resp. rate 18, weight 59.7 kg (131 lb 9.8 oz), SpO2 100.00%. No results found.  Recent Labs  07/22/13 1752  WBC 9.6  HGB 8.6*  HCT 25.8*  PLT 321    Recent Labs  07/22/13 1752  NA 131*  K 4.2  CL 88*  GLUCOSE 144*  BUN 74*  CREATININE 9.46*  CALCIUM 7.8*   CBG (last 3)   Recent Labs  07/23/13 1704 07/23/13 2015 07/24/13 0737  GLUCAP 292* 86 241*    Wt Readings from Last 3 Encounters:  07/24/13 59.7 kg (131 lb 9.8 oz)  07/20/13 60.9 kg (134 lb 4.2 oz)  07/20/13 60.9 kg (134 lb 4.2 oz)    Physical Exam:  HENT: oral mucosa pink and moist Head: Normocephalic.  Eyes: EOM are normal.  Neck: Normal range of motion. Neck supple. No thyromegaly present.  Cardiovascular: Normal rate and regular rhythm.  Respiratory: Effort normal and breath sounds normal. No respiratory distress.  GI: Soft. Bowel sounds are normal. He exhibits no distension.  Neurological: He is alert. No cranial nerve deficit. He exhibits normal muscle tone.  Alert but a little confused. Impulsive. Follows simple commands Moves all 4's. UE 3+ to 4/5 prox to distal. LE 3/5 HF, KE and 4/5 ankles. Senses pain in all 4's Skin: Skin is warm and dry. Ext: no thrill thru AVG, minimal sound with auscultation Psychiatric: He has a normal mood and affect. Non-agitated. pleasant   Assessment/Plan: 1. Functional deficits secondary to deconditioning and encephalopathy which require 3+ hours per day of interdisciplinary therapy in a comprehensive inpatient rehab setting. Physiatrist is providing close team supervision and 24 hour management of active medical problems listed below. Physiatrist and rehab team continue to assess barriers  to discharge/monitor patient progress toward functional and medical goals. FIM: FIM - Bathing Bathing Steps Patient Completed: Chest;Right Arm;Left Arm;Abdomen;Front perineal area;Buttocks;Right upper leg;Left upper leg;Right lower leg (including foot);Left lower leg (including foot) Bathing: 4: Steadying assist  FIM - Upper Body Dressing/Undressing Upper body dressing/undressing: 0: Wears gown/pajamas-no public clothing FIM - Lower Body Dressing/Undressing Lower body dressing/undressing steps patient completed: Don/Doff right sock Lower body dressing/undressing: 0: Wears gown/pajamas-no public clothing  FIM - Toileting Toileting steps completed by patient: Performs perineal hygiene Toileting Assistive Devices: Grab bar or rail for support Toileting: 6: More than reasonable amount of time  FIM - Diplomatic Services operational officerToilet Transfers Toilet Transfers Assistive Devices: Art gallery managerWalker Toilet Transfers: 4-To toilet/BSC: Min A (steadying Pt. > 75%);4-From toilet/BSC: Min A (steadying Pt. > 75%)  FIM - Bed/Chair Transfer Bed/Chair Transfer Assistive Devices: Therapist, occupationalWalker Bed/Chair Transfer: 4: Bed > Chair or W/C: Min A (steadying Pt. > 75%);4: Chair or W/C > Bed: Min A (steadying Pt. > 75%)  FIM - Locomotion: Wheelchair Distance: 40 Locomotion: Wheelchair: 1: Total Assistance/staff pushes wheelchair (Pt<25%) FIM - Locomotion: Ambulation Locomotion: Ambulation Assistive Devices: Designer, industrial/productWalker - Rolling Ambulation/Gait Assistance: 4: Min guard;4: Min assist Locomotion: Ambulation: 4: Travels 150 ft or more with minimal assistance (Pt.>75%)  Comprehension Comprehension Mode: Auditory Comprehension: 3-Understands basic 50 - 74% of the time/requires cueing 25 - 50%  of the time  Expression Expression Mode: Verbal Expression: 4-Expresses basic 75 - 89% of the time/requires cueing 10 - 24% of the time. Needs helper to occlude trach/needs to  repeat words.  Social Interaction Social Interaction: 2-Interacts appropriately 25 - 49%  of time - Needs frequent redirection.  Problem Solving Problem Solving: 2-Solves basic 25 - 49% of the time - needs direction more than half the time to initiate, plan or complete simple activities  Memory Memory Assistive Devices: Other (Comment) Memory: 2-Recognizes or recalls 25 - 49% of the time/requires cueing 51 - 75% of the time  Medical Problem List and Plan:  1. Deconditioning related to multiple medical issues/encephalopathy  2. DVT Prophylaxis/Anticoagulation: Subcutaneous heparin.    3. Pain Management: Tylenol as needed.   4. Mood/encephalopathy: Continue Risperdal twice a day   -added an HS dose prn for agitation  -remains impulsive----needs supervision 5. Neuropsych: This patient is not capable of making decisions on his own behalf.  6. End-stage renal disease with hemodialysis. Followup per renal services  7.NSTEMI. Continue aspirin/ Plavix/Imdur and Coreg 12.5 mg twice a day. Recent cardiac catheterization 3 vessel CAD. Continue medical management---asymptomatic at present 8. Chronic anemia. Continue Aranesp  9. Positive Clostridium difficile. Continue Flagyl initiated 07/18/2012 x14 days total. Contact precautions  10. Diabetes mellitus with peripheral neuropathy. 70/30 insulin resumed at 15u bid yesterday---sugars bottomed out  -adjust to 15u daily today  -Continue sliding scale insulin covg as well 11. Hyperlipidemia. Lipitor  LOS (Days) 4 A FACE TO FACE EVALUATION WAS PERFORMED  Ranelle OysterZachary T Amor Packard 07/24/2013 8:01 AM

## 2013-07-25 ENCOUNTER — Inpatient Hospital Stay (HOSPITAL_COMMUNITY): Payer: Medicare Other | Admitting: *Deleted

## 2013-07-25 ENCOUNTER — Encounter (HOSPITAL_COMMUNITY): Payer: Medicare Other | Admitting: Occupational Therapy

## 2013-07-25 ENCOUNTER — Inpatient Hospital Stay (HOSPITAL_COMMUNITY): Payer: Medicare Other | Admitting: Speech Pathology

## 2013-07-25 DIAGNOSIS — R5381 Other malaise: Secondary | ICD-10-CM

## 2013-07-25 LAB — RENAL FUNCTION PANEL
Albumin: 3 g/dL — ABNORMAL LOW (ref 3.5–5.2)
BUN: 91 mg/dL — AB (ref 6–23)
CALCIUM: 7.8 mg/dL — AB (ref 8.4–10.5)
CO2: 20 meq/L (ref 19–32)
CREATININE: 10.67 mg/dL — AB (ref 0.50–1.35)
Chloride: 88 mEq/L — ABNORMAL LOW (ref 96–112)
GFR calc non Af Amer: 4 mL/min — ABNORMAL LOW (ref >90)
GFR, EST AFRICAN AMERICAN: 5 mL/min — AB (ref >90)
GLUCOSE: 87 mg/dL (ref 70–99)
Phosphorus: 5.2 mg/dL — ABNORMAL HIGH (ref 2.3–4.6)
Potassium: 4.5 mEq/L (ref 3.7–5.3)
SODIUM: 129 meq/L — AB (ref 137–147)

## 2013-07-25 LAB — CBC
HCT: 23.3 % — ABNORMAL LOW (ref 39.0–52.0)
Hemoglobin: 7.9 g/dL — ABNORMAL LOW (ref 13.0–17.0)
MCH: 31.7 pg (ref 26.0–34.0)
MCHC: 33.9 g/dL (ref 30.0–36.0)
MCV: 93.6 fL (ref 78.0–100.0)
PLATELETS: 378 10*3/uL (ref 150–400)
RBC: 2.49 MIL/uL — ABNORMAL LOW (ref 4.22–5.81)
RDW: 15 % (ref 11.5–15.5)
WBC: 10.1 10*3/uL (ref 4.0–10.5)

## 2013-07-25 LAB — GLUCOSE, CAPILLARY
GLUCOSE-CAPILLARY: 208 mg/dL — AB (ref 70–99)
GLUCOSE-CAPILLARY: 113 mg/dL — AB (ref 70–99)
Glucose-Capillary: 197 mg/dL — ABNORMAL HIGH (ref 70–99)
Glucose-Capillary: 104 mg/dL — ABNORMAL HIGH (ref 70–99)

## 2013-07-25 MED ORDER — NEPRO/CARBSTEADY PO LIQD
237.0000 mL | ORAL | Status: DC | PRN
  Filled 2013-07-25: qty 237

## 2013-07-25 MED ORDER — HEPARIN SODIUM (PORCINE) 1000 UNIT/ML DIALYSIS: 1000.0000 [IU] | INTRAMUSCULAR | Status: DC | PRN

## 2013-07-25 MED ORDER — RISPERIDONE 1 MG PO TABS
1.0000 mg | ORAL_TABLET | Freq: Every day | ORAL | Status: DC
  Administered 2013-07-26: 1 mg via ORAL
  Filled 2013-07-25 (×3): qty 1

## 2013-07-25 MED ORDER — LORAZEPAM 2 MG/ML IJ SOLN
INTRAMUSCULAR | Status: AC
  Filled 2013-07-25: qty 1

## 2013-07-25 MED ORDER — SODIUM CHLORIDE 0.9 % IV SOLN: 100.0000 mL | INTRAVENOUS | Status: DC | PRN

## 2013-07-25 MED ORDER — HEPARIN SODIUM (PORCINE) 1000 UNIT/ML DIALYSIS: 2000.0000 [IU] | Freq: Once | INTRAMUSCULAR | Status: DC

## 2013-07-25 MED ORDER — LIDOCAINE HCL (PF) 1 % IJ SOLN: 5.0000 mL | INTRAMUSCULAR | Status: DC | PRN

## 2013-07-25 MED ORDER — INSULIN ASPART PROT & ASPART (70-30 MIX) 100 UNIT/ML ~~LOC~~ SUSP
5.0000 [IU] | Freq: Every day | SUBCUTANEOUS | Status: DC
  Administered 2013-07-25 – 2013-07-26 (×2): 5 [IU] via SUBCUTANEOUS

## 2013-07-25 MED ORDER — RISPERIDONE 0.5 MG PO TABS
0.5000 mg | ORAL_TABLET | Freq: Every day | ORAL | Status: DC
  Administered 2013-07-26 – 2013-07-27 (×3): 0.5 mg via ORAL
  Filled 2013-07-25 (×3): qty 1

## 2013-07-25 MED ORDER — LIDOCAINE-PRILOCAINE 2.5-2.5 % EX CREA: 1.0000 "application " | TOPICAL_CREAM | CUTANEOUS | Status: DC | PRN

## 2013-07-25 MED ORDER — ALTEPLASE 2 MG IJ SOLR: 2.0000 mg | Freq: Once | INTRAMUSCULAR | Status: DC | PRN

## 2013-07-25 MED ORDER — LORAZEPAM 2 MG/ML IJ SOLN
0.5000 mg | Freq: Once | INTRAMUSCULAR | Status: AC
  Administered 2013-07-25: 0.5 mg via INTRAVENOUS

## 2013-07-25 MED ORDER — PENTAFLUOROPROP-TETRAFLUOROETH EX AERO: 1.0000 "application " | INHALATION_SPRAY | CUTANEOUS | Status: DC | PRN

## 2013-07-25 NOTE — Progress Notes (Signed)
Speech Language Pathology Daily Session Note  Patient Details  Name: [name] MRN: [id number] Date of Birth: [birth year]  Today's Date: 07/25/2013 Time: 1350-1430 Time Calculation (min): 40 min  Short Term Goals: Week 1: SLP Short Term Goal 1 (Week 1): Pt will utilize safe swallowing strategies with recommended textures with Min cues SLP Short Term Goal 2 (Week 1): Pt will demonstrate adequate mastication and oral clearance with trials of Dys 3 textures with Min cues SLP Short Term Goal 3 (Week 1): Pt will use environmental cues to demonstrate orientation x4 with Mod cues SLP Short Term Goal 4 (Week 1): Pt will sustain attention to functional familiar task for at least 15 min with Min cues SLP Short Term Goal 5 (Week 1): Pt will utilize external memory aids to recall new/daily information with Min cues SLP Short Term Goal 6 (Week 1): Pt will maintain topic of conversation across 3 conversational turns with Min cues  Skilled Therapeutic Interventions: Skilled treatment focused addressing dysphagia goals.  SLP facilitated session with observation of PO trials consisting of Dys.3 textures and thin liquids with Supervision level verbal cues for a slow pace. SLP also facilitated session with Min physical assist to transfer back to bed.  Continue plan of care.   FIM:  Comprehension Comprehension Mode: Auditory Comprehension: 3-Understands basic 50 - 74% of the time/requires cueing 25 - 50%  of the time Expression Expression Mode: Verbal Expression: 4-Expresses basic 75 - 89% of the time/requires cueing 10 - 24% of the time. Needs helper to occlude trach/needs to repeat words. Social Interaction Social Interaction: 5-Interacts appropriately 90% of the time - Needs monitoring or encouragement for participation or interaction. Problem Solving Problem Solving: 3-Solves basic 50 - 74% of the time/requires cueing 25 - 49% of the time Memory Memory: 3-Recognizes or recalls 50 - 74% of the  time/requires cueing 25 - 49% of the time  Pain Pain Assessment Pain Assessment: No/denies pain  Therapy/Group: Individual Therapy  Charlane FerrettiMelissa Sharrie Self, M.A., CCC-SLP [phone]  [name] 07/25/2013, 4:51 PM

## 2013-07-25 NOTE — Plan of Care (Signed)
Problem: RH SAFETY Goal: RH STG ADHERE TO SAFETY PRECAUTIONS W/ASSISTANCE/DEVICE STG Adhere to Safety Precautions With Assistance/Device. Mod I  Outcome: Not Progressing Patient impulsive, needs bed alarm at night. Needs mod cues.

## 2013-07-25 NOTE — Progress Notes (Signed)
Occupational Therapy Session Note  Patient Details  Name: [name] MRN: [id number] Date of Birth: [birth year]  Today's Date: 07/25/2013 Time: 3244-01020730-0815 Time Calculation (min): 45 min  Short Term Goals: Week 1:  OT Short Term Goal 1 (Week 1): STG=LTG due to anticipated brief length of stay  Skilled Therapeutic Interventions/Progress Updates:    1:1 self care retraining at shower level with focus on functional ambulation with RW with max cuing for safety, sit to stands, dynamic standing balance with and without UE support, following simple  Commands, sequencing and task organization etc. Pt requires cuing for all tasks for problem solving and completion of all tasks. Pt participated in eating breakfast with mod cuing for bite size and ensuring clear oral cavity. Pt oriented to hospital but due to language barrier difficulty to know whether oriented to situation (talked about friend).   Therapy Documentation Precautions:  Precautions Precautions: Fall Precaution Comments: spanish speaking, poor safety awareness Restrictions Weight Bearing Restrictions: No Pain: No c/o    ADL: ADL ADL Comments: see FIM  See FIM for current functional status  Therapy/Group: Individual Therapy  Melonie FloridaJennifer L Luan Maberry 07/25/2013, 7:57 AM

## 2013-07-25 NOTE — Progress Notes (Signed)
Physical Therapy Session Note  Patient Details  Name: [name] MRN: [id number] Date of Birth: 04-24-1933  Today's Date: 07/25/2013 Time: 6578-46960830-0930 and 1115-1200 Time Calculation (min): 60 min and 45 min  Short Term Goals: Week 1:  PT Short Term Goal 1 (Week 1): STGs=LTGs due to ELOS  Skilled Therapeutic Interventions/Progress Updates:    First session: Patient received sitting in wheelchair at [employer] station. Session focused on cognitive remediation/carry over of techniques with functional mobility. BERG Balance Test administered, see details below. Wheelchair mobility 120' x1 for activity tolerance with B UE and minA for negotiation of obstacles/turning. Gait training in controlled environment >150' x1 and home environment in ADL apartment 30' x1 (carpeted in confined spaces) with RW and supervision; repeated cues for RW safety/management and for negotiation of obstacles as patient tends to use RW to push obstacles out of his path. Patient performed floor transfer with supervision and requires max verbal/tactile cues and visual demonstration for sequencing. Stair negotiation x5 steps with one handrail and close supervision, cues for sequencing. Patient left seated in wheelchair at RN station with seatbelt donned.  Second session: Patient received sitting in wheelchair at RN station. Session focused on functional transfers, furniture transfers, gait training, and dynamic standing balance. Gait training in controlled environment 174' x1 with RW and supervision/verbal cues for RW safety, 200' x2 without AD and minA, cues for appropriate step/stride length. NuStep Level 4 with B UE/LE for general strengthening/activity tolerance.  Standing dynamic balance: Alternating toe taps on 5 inch step 2 x20 with L HHA and minA; Lateral stepping with hip abd BOSU ball push x15' each side. Patient returned to RN station and left sitting in wheelchair with seatbelt donned.  Therapy Documentation Precautions:   Precautions Precautions: Fall Precaution Comments: spanish speaking, poor safety awareness Restrictions Weight Bearing Restrictions: No Pain: Pain Assessment Pain Assessment: No/denies pain Locomotion : Ambulation Ambulation/Gait Assistance: 5: Supervision Wheelchair Mobility Distance: 120  Balance: Balance Balance Assessed: Yes Standardized Balance Assessment Standardized Balance Assessment: Berg Balance Test Berg Balance Test Sit to Stand: Able to stand without using hands and stabilize independently Standing Unsupported: Able to stand 2 minutes with supervision Sitting with Back Unsupported but Feet Supported on Floor or Stool: Able to sit safely and securely 2 minutes Stand to Sit: Sits safely with minimal use of hands Transfers: Able to transfer safely, definite need of hands Standing Unsupported with Eyes Closed: Able to stand 10 seconds with supervision Standing Ubsupported with Feet Together: Able to place feet together independently and stand for 1 minute with supervision From Standing, Reach Forward with Outstretched Arm: Can reach forward >12 cm safely (5") From Standing Position, Pick up Object from Floor: Able to pick up shoe, needs supervision From Standing Position, Turn to Look Behind Over each Shoulder: Looks behind one side only/other side shows less weight shift Turn 360 Degrees: Able to turn 360 degrees safely but slowly Standing Unsupported, Alternately Place Feet on Step/Stool: Able to complete >2 steps/needs minimal assist Standing Unsupported, One Foot in Front: Able to take small step independently and hold 30 seconds Standing on One Leg: Tries to lift leg/unable to hold 3 seconds but remains standing independently Total Score: 39/56, indicating patient is at a significant risk for falls (>80%).  See FIM for current functional status  Therapy/Group: Individual Therapy  Chipper HerbBridget S Justina Bertini S. Margurete Guaman, PT, DPT 07/25/2013, 11:15 AM

## 2013-07-25 NOTE — Progress Notes (Signed)
[place] PHYSICAL MEDICINE & REHABILITATION     PROGRESS NOTE    Subjective/Complaints: Still restless at night with confusion, impulsive Awake and alert this am  Objective: Vital Signs: Blood pressure 128/71, pulse 74, temperature 97.8 F (36.6 C), temperature source Oral, resp. rate 17, weight 59 kg (130 lb 1.1 oz), SpO2 95.00%. No results found.  Recent Labs  07/22/13 1752  WBC 9.6  HGB 8.6*  HCT 25.8*  PLT 321    Recent Labs  07/22/13 1752  NA 131*  K 4.2  CL 88*  GLUCOSE 144*  BUN 74*  CREATININE 9.46*  CALCIUM 7.8*   CBG (last 3)   Recent Labs  07/24/13 1646 07/24/13 2041 07/25/13 0718  GLUCAP 112* 202* 208*    Wt Readings from Last 3 Encounters:  07/25/13 59 kg (130 lb 1.1 oz)  07/20/13 60.9 kg (134 lb 4.2 oz)  07/20/13 60.9 kg (134 lb 4.2 oz)    Physical Exam:  HENT: oral mucosa pink and moist Head: Normocephalic.  Eyes: EOM are normal.  Neck: Normal range of motion. Neck supple. No thyromegaly present.  Cardiovascular: Normal rate and regular rhythm.  Respiratory: Effort normal and breath sounds normal. No respiratory distress.  GI: Soft. Bowel sounds are normal. He exhibits no distension.  Neurological: He is alert. No cranial nerve deficit. He exhibits normal muscle tone.  Alert but a little confused. Impulsive. Follows simple commands Moves all 4's. UE 3+ to 4/5 prox to distal. LE 3/5 HF, KE and 4/5 ankles. Senses pain in all 4's Skin: Skin is warm and dry. Ext: no thrill thru AVG, minimal sound with auscultation Psychiatric: He has a normal mood and affect. Non-agitated. pleasant   Assessment/Plan: 1. Functional deficits secondary to deconditioning and encephalopathy which require 3+ hours per day of interdisciplinary therapy in a comprehensive inpatient rehab setting. Physiatrist is providing close team supervision and 24 hour management of active medical problems listed below. Physiatrist and rehab team continue to assess barriers  to discharge/monitor patient progress toward functional and medical goals. FIM: FIM - Bathing Bathing Steps Patient Completed: Chest;Right upper leg;Right Arm;Left upper leg;Left Arm;Right lower leg (including foot);Abdomen;Left lower leg (including foot);Front perineal area;Buttocks Bathing: 5: Supervision: Safety issues/verbal cues  FIM - Upper Body Dressing/Undressing Upper body dressing/undressing steps patient completed: Thread/unthread right sleeve of pullover shirt/dresss;Put head through opening of pull over shirt/dress;Pull shirt over trunk;Thread/unthread right sleeve of front closure shirt/dress Upper body dressing/undressing: 5: Supervision: Safety issues/verbal cues FIM - Lower Body Dressing/Undressing Lower body dressing/undressing steps patient completed: Thread/unthread right underwear leg;Thread/unthread left underwear leg;Pull underwear up/down;Don/Doff right sock;Don/Doff left sock Lower body dressing/undressing: 5: Supervision: Safety issues/verbal cues  FIM - Toileting Toileting steps completed by patient: Adjust clothing prior to toileting;Performs perineal hygiene;Adjust clothing after toileting Toileting Assistive Devices: Grab bar or rail for support Toileting: 6: Assistive device: No helper  FIM - Diplomatic Services operational officerToilet Transfers Toilet Transfers Assistive Devices: Art gallery managerWalker Toilet Transfers: 5-To toilet/BSC: Supervision (verbal cues/safety issues);5-From toilet/BSC: Supervision (verbal cues/safety issues)  FIM - BankerBed/Chair Transfer Bed/Chair Transfer Assistive Devices: Walker;Bed rails;HOB elevated Bed/Chair Transfer: 5: Supine > Sit: Supervision (verbal cues/safety issues);5: Sit > Supine: Supervision (verbal cues/safety issues);5: Chair or W/C > Bed: Supervision (verbal cues/safety issues);5: Bed > Chair or W/C: Supervision (verbal cues/safety issues)  FIM - Locomotion: Wheelchair Distance: 40 Locomotion: Wheelchair: 0: Activity did not occur FIM - Locomotion:  Ambulation Locomotion: Ambulation Assistive Devices: Designer, industrial/productWalker - Rolling Ambulation/Gait Assistance: 5: Supervision Locomotion: Ambulation: 5: Travels 150 ft or more with supervision/safety  issues  Comprehension Comprehension Mode: Auditory Comprehension: 1-Understands basic less than 25% of the time/requires cueing 75% of the time  Expression Expression Mode: Verbal Expression: 3-Expresses basic 50 - 74% of the time/requires cueing 25 - 50% of the time. Needs to repeat parts of sentences.  Social Interaction Social Interaction: 4-Interacts appropriately 75 - 89% of the time - Needs redirection for appropriate language or to initiate interaction.  Problem Solving Problem Solving: 3-Solves basic 50 - 74% of the time/requires cueing 25 - 49% of the time  Memory Memory Assistive Devices: Other (Comment) Memory: 3-Recognizes or recalls 50 - 74% of the time/requires cueing 25 - 49% of the time  Medical Problem List and Plan:  1. Deconditioning related to multiple medical issues/encephalopathy  2. DVT Prophylaxis/Anticoagulation: Subcutaneous heparin.    3. Pain Management: Tylenol as needed.   4. Mood/encephalopathy: Continue Risperdal twice a day   -added an HS dose prn for agitation  -remains impulsive----needs supervision 5. Neuropsych: This patient is not capable of making decisions on his own behalf.  6. End-stage renal disease with hemodialysis. Followup per renal services  7.NSTEMI. Continue aspirin/ Plavix/Imdur and Coreg 12.5 mg twice a day. Recent cardiac catheterization 3 vessel CAD. Continue medical management---asymptomatic at present 8. Chronic anemia. Continue Aranesp  9. Positive Clostridium difficile. Continue Flagyl initiated 07/18/2012 x14 days total. Contact precautions  10. Diabetes mellitus with peripheral neuropathy. 70/30 insulin resumed at 15u bid yesterday---sugars bottomed out  -adjusted to 15u daily today---add 5mg  hs  -Continue sliding scale insulin covg as  well 11. Hyperlipidemia. Lipitor  LOS (Days) 5 A FACE TO FACE EVALUATION WAS PERFORMED  Ranelle OysterZachary T Kaci Dillie 07/25/2013 7:40 AM

## 2013-07-26 ENCOUNTER — Encounter (HOSPITAL_COMMUNITY): Payer: Medicare Other | Admitting: Occupational Therapy

## 2013-07-26 ENCOUNTER — Inpatient Hospital Stay (HOSPITAL_COMMUNITY): Payer: Medicare Other | Admitting: Physical Therapy

## 2013-07-26 ENCOUNTER — Ambulatory Visit (HOSPITAL_COMMUNITY): Payer: Medicare Other

## 2013-07-26 ENCOUNTER — Inpatient Hospital Stay (HOSPITAL_COMMUNITY): Payer: Medicare Other

## 2013-07-26 DIAGNOSIS — R5381 Other malaise: Secondary | ICD-10-CM

## 2013-07-26 LAB — GLUCOSE, CAPILLARY
GLUCOSE-CAPILLARY: 114 mg/dL — AB (ref 70–99)
GLUCOSE-CAPILLARY: 130 mg/dL — AB (ref 70–99)
GLUCOSE-CAPILLARY: 134 mg/dL — AB (ref 70–99)
Glucose-Capillary: 147 mg/dL — ABNORMAL HIGH (ref 70–99)

## 2013-07-26 MED ORDER — DARBEPOETIN ALFA-POLYSORBATE 40 MCG/0.4ML IJ SOLN: 40.0000 ug | INTRAMUSCULAR | Status: DC

## 2013-07-26 NOTE — Progress Notes (Addendum)
Social Work Patient ID: Titus MouldAntonio Allinson, male   DOB: 03-17-1934, 80 y.o.   MRN: [id number]   Spoke with pt's daughter and sister immediately following team conference yesterday.  Both aware pt approaching goal level of supervision and target d/c date set for 4/23.  Sister with no comment.  Daughter sitting quietly and simply states, "thank you".  When questioned if they had any concerns, daughter simply states, "No. Thank you.".  Explained that we needed to begin education with Byrd HesselbachMaria (pt's caregiver) now since they have told us the plan will be for pt to d/c home with her.  Daughter says that I need to contact her directly. I did contact Byrd HesselbachMaria and review team conference and attempt to set of education, however, Byrd HesselbachMaria very uncertain when she could come in as she needed to get her home set up for pt to stay (Moving bed, furniture, etc.).  She was to let us know today when to schedule ed sessions.  Contacted this morning by Byrd HesselbachMaria who now reports that she does NOT feel she can provide care to pt given his dementia and that the d/c plan should be changed to SNF.  I explained that the sister or daughter needs to follow up with us to confirm that this is the family's desire to change plan.  She will have daughter contact SW this morning.  Will begin process of SNF placement.  Amada JupiterLucy Alandra Sando, Kentucky[place]CS[place]   0930  Just contacted by pt's daughter who now states that "I'm just gonna take him home with me." When questioned further, daughter states she plans to [place] to [place] Friday.  Daughter is on her way into the hospital.  Plan to discuss much further with her when she arrives.  Stay tuned...  Amada JupiterLucy Satoya Feeley, LCSW

## 2013-07-26 NOTE — Progress Notes (Signed)
Occupational Therapy Session Note  Patient Details  Name: [name] MRN: [id number] Date of Birth: 1934/03/26  Today's Date: 07/26/2013 Time: 3235-5732 Time Calculation (min): 45 min  Short Term Goals: Week 1:  OT Short Term Goal 1 (Week 1): STG=LTG due to anticipated brief length of stay  Skilled Therapeutic Interventions/Progress Updates:   1:1 self care retraining at shower level GRAD DAY! Focus on following one step commands, sequencing, task organization, orientation, standing balance, safety with RW etc.  Pt only oriented to self.  Reported he was at a school and didn't know what HD is, and disoriented to country.    Therapy Documentation Precautions:  Precautions Precautions: Fall Precaution Comments: spanish speaking, poor safety awareness Restrictions Weight Bearing Restrictions: No Pain:  no c/o  ADL: ADL ADL Comments: see FIM Exercises:   Other Treatments:    See FIM for current functional status  Therapy/Group: Individual Therapy  Merrilee Seashore 07/26/2013, 10:17 AM Occupational Therapy Discharge Summary   Patient has met 5 of 8 long term goals due to improved activity tolerance, improved balance, postural control and carry out basic functional ADLs.  Patient to discharge at overall Supervision level.  Patient's care partners have not been present for formal education due to absence and frequent changes in d/c planning, but it has been stressed by the team via phone pt requires 24 hr supervision for all functional tasks to due decreased cognition and to remain safe in the home.   Reasons goals not met: Pt still requires verbal cues to carry out all ADL safely and for thoroughness.   Recommendation:  Patient will benefit from ongoing skilled OT services in home health v SNF to continue to advance functional skills in the area of BADL, Reduce care partner burden depending on family's final decision. .  Equipment: No equipment provided at this  time  Reasons for discharge: treatment goals met and discharge from hospital  Patient/family agrees with progress made and goals achieved: Yes  OT Discharge Precautions/Restrictions  Precautions Precautions: Fall Precaution Comments: spanish speaking, poor safety awareness Restrictions Weight Bearing Restrictions: No General   Vital Signs Therapy Vitals Pulse Rate: 80 BP: 135/53 mmHg Pain Pain Assessment Pain Assessment: No/denies pain ADL ADL ADL Comments: see FIM Vision/Perception  Vision- History Baseline Vision/History: Wears glasses Wears Glasses: At all times Vision- Assessment Additional Comments: ?inattention to right  Cognition Overall Cognitive Status: Impaired/Different from baseline Arousal/Alertness: Awake/alert Orientation Level: Oriented to person;Disoriented to place;Disoriented to time;Disoriented to situation Attention: Sustained Focused Attention: Appears intact Sustained Attention: Impaired Sustained Attention Impairment: Functional basic Memory: Impaired Memory Impairment: Storage deficit;Retrieval deficit;Decreased recall of new information;Decreased short term memory Awareness Impairment: Intellectual impairment;Emergent impairment Problem Solving: Impaired Problem Solving Impairment: Functional basic Behaviors: Impulsive Safety/Judgment: Impaired Sensation Sensation Light Touch: Appears Intact Stereognosis: Appears Intact Hot/Cold: Appears Intact Proprioception: Appears Intact Coordination Gross Motor Movements are Fluid and Coordinated: Yes Fine Motor Movements are Fluid and Coordinated: Yes Motor  Motor Motor - Discharge Observations: improved postural alignment/ control Mobility  Bed Mobility Supine to Sit: 6: Modified independent (Device/Increase time) Sit to Supine: 6: Modified independent (Device/Increase time) Transfers Transfers: Sit to Stand;Stand to Sit Sit to Stand: 5: Supervision Stand to Sit: 5: Supervision   Trunk/Postural Assessment  Cervical Assessment Cervical Assessment: Within Functional Limits Thoracic Assessment Thoracic Assessment: Within Functional Limits Lumbar Assessment Lumbar Assessment: Within Functional Limits Postural Control Righting Reactions: delayed  Balance Static Sitting Balance Static Sitting - Level of Assistance: 7: Independent Static Standing Balance Static Standing -  Level of Assistance: 5: Stand by assistance Extremity/Trunk Assessment RUE Assessment RUE Assessment: Within Functional Limits LUE Assessment LUE Assessment: Within Functional Limits  See FIM for current functional status  Merrilee Seashore 07/26/2013, 11:35 AM

## 2013-07-26 NOTE — Patient Care Conference (Signed)
Inpatient RehabilitationTeam Conference and Plan of Care Update Date: 07/25/2013   Time: 2:50 PM    Patient Name: [name]      Medical Record Number: [id number]  Date of Birth: 01-08-1934 Sex: Male         Room/Bed: 4W03C/4W03C-01 Payor Info: Payor: MEDICARE / Plan: MEDICARE PART A AND B / Product Type: *No Product type* /    Admitting Diagnosis: Deconditioned with enceph   Admit Date/Time:  07/20/2013  6:42 PM Admission Comments: No comment available   Primary Diagnosis:  <principal problem not specified> Principal Problem: <principal problem not specified>  Patient Active Problem List   Diagnosis Date Noted  . Physical deconditioning 07/20/2013  . Non-ST elevation myocardial infarction (NSTEMI), initial episode of care 07/18/2013  . Acute encephalopathy 07/09/2013  . ESRD on dialysis 07/09/2013  . Unspecified essential hypertension 07/09/2013  . Type II or unspecified type diabetes mellitus with peripheral circulatory disorders, uncontrolled(250.72) 07/09/2013  . Acute respiratory failure 07/09/2013  . PVD (peripheral vascular disease) 05/15/2013  . Aftercare following surgery of the circulatory system, NEC 05/15/2013  . Peripheral vascular disease, unspecified 11/03/2011    Expected Discharge Date: Expected Discharge Date: 07/27/13  Team Members Present: Physician leading conference: Dr. Faith RogueZachary Swartz Social Worker Present: Amada JupiterLucy Lorren Splawn, LCSW Nurse Present: Carlean PurlMaryann Barbour, RN PT Present: Cyndia SkeetersBridgett Ripa, Scot JunPT;Caroline King, PT OT Present: Roney MansJennifer Smith, OT;Ardis Rowanom Lanier, COTA SLP Present: Feliberto Gottronourtney Payne, SLP PPS Coordinator present : Tora DuckMarie Noel, RN, CRRN;Becky Henrene DodgeWindsor, PT     Current Status/Progress Goal Weekly Team Focus  Medical   DECONDITIONING/ENCEPHALOPATHY---STILL CONUFSED HS  IMPROVE SAFETY AWARENESS  SAFETY, HD, CV MANAGEMENT   Bowel/Bladder   cont of bowel, LBM 4/20; anuric, HD Tue, Thu, Sat  remain cont of bowel  monitor   Swallow/Nutrition/  Hydration   Dys. 2 textures with thin liquids, full supervision   supervision  trials of Dys. 3 textures   ADL's   Occasional minimal assistance with ADL  Supervision  Activity tolerance, balance, functional problem solving   Mobility   supervision overall with instances of minA  supervision overall-will need 24/7 due to cognitive deficits  safety, cognitive remediation, functional mobility, balance, activity tolerance   Communication             Safety/Cognition/ Behavioral Observations  Mod-Max A  Min A  problem solving, orientation, attention, memory    Pain   n/a  remain pain free  monitor   Skin   CDI  no infection or skin breakdown while in Rehab  assess qshift and prn    Rehab Goals Patient on target to meet rehab goals: Yes *See Care Plan and progress notes for long and short-term goals.  Barriers to Discharge: POOR COGNITION, SAFETY    Possible Resolutions to Barriers:  SUPERVISION AT HOME    Discharge Planning/Teaching Needs:  home with family to provide 24/7 assistance;  likely to plan a move later to [place] to live with family  planning to do with caregiver, Antonio HesselbachMaria   Team Discussion:  Pt very confused with, likely, baseline dementia.  Already at a supervision level.  Impulsive and NO carryover of information from session to session.  Must begin family ed ASAP.  Approaching goals.  Revisions to Treatment Plan:  None   Continued Need for Acute Rehabilitation Level of Care: The patient requires daily medical management by a physician with specialized training in physical medicine and rehabilitation for the following conditions: Daily direction of a multidisciplinary physical rehabilitation program to ensure safe treatment  while eliciting the highest outcome that is of practical value to the patient.: Yes Daily analysis of laboratory values and/or radiology reports with any subsequent need for medication adjustment of medical intervention for : Neurological  problems;Pulmonary problems;Other  Amada JupiterLucy Lenor Provencher 07/26/2013, 8:48 AM

## 2013-07-26 NOTE — Progress Notes (Signed)
Social Work Patient ID: [name], male   DOB: December 31, 1933, 80 y.o.   MRN: [id number]  CSW spoke with pt's dtr via telephone and she stated she is contacting pt's dialysis center to find out what is involved in getting pt set up for dialysis in [place].  CSW spoke with the nephrologist who plans to have pt dialyze Thursday morning prior to pt d/c so that he can travel to [place] on Friday.  CSW awaits dtr's arrival to the hospital so that we can begin making f/u plans for pt.

## 2013-07-26 NOTE — Progress Notes (Signed)
[place] PHYSICAL MEDICINE & REHABILITATION     PROGRESS NOTE    Subjective/Complaints: Last night a little better. Just waking up this am    Objective: Vital Signs: Blood pressure 150/38, pulse 71, temperature 98.8 F (37.1 C), temperature source Oral, resp. rate 17, weight 59.5 kg (131 lb 2.8 oz), SpO2 97.00%. No results found.  Recent Labs  07/25/13 1700  WBC 10.1  HGB 7.9*  HCT 23.3*  PLT 378    Recent Labs  07/25/13 1700  NA 129*  K 4.5  CL 88*  GLUCOSE 87  BUN 91*  CREATININE 10.67*  CALCIUM 7.8*   CBG (last 3)   Recent Labs  07/25/13 1625 07/25/13 2140 07/26/13 0726  GLUCAP 113* 104* 147*    Wt Readings from Last 3 Encounters:  07/26/13 59.5 kg (131 lb 2.8 oz)  07/20/13 60.9 kg (134 lb 4.2 oz)  07/20/13 60.9 kg (134 lb 4.2 oz)    Physical Exam:  HENT: oral mucosa pink and moist Head: Normocephalic.  Eyes: EOM are normal.  Neck: Normal range of motion. Neck supple. No thyromegaly present.  Cardiovascular: Normal rate and regular rhythm.  Respiratory: Effort normal and breath sounds normal. No respiratory distress.  GI: Soft. Bowel sounds are normal. He exhibits no distension.  Neurological: He is alert. No cranial nerve deficit. He exhibits normal muscle tone.  Alert but a little confused. Impulsive. Follows simple commands Moves all 4's. UE 3+ to 4/5 prox to distal. LE 3/5 HF, KE and 4/5 ankles. Senses pain in all 4's Skin: Skin is warm and dry. Ext: no thrill thru AVG, minimal sound with auscultation Psychiatric: He has a normal mood and affect. Non-agitated. pleasant   Assessment/Plan: 1. Functional deficits secondary to deconditioning and encephalopathy which require 3+ hours per day of interdisciplinary therapy in a comprehensive inpatient rehab setting. Physiatrist is providing close team supervision and 24 hour management of active medical problems listed below. Physiatrist and rehab team continue to assess barriers to  discharge/monitor patient progress toward functional and medical goals. FIM: FIM - Bathing Bathing Steps Patient Completed: Chest;Right upper leg;Right Arm;Left upper leg;Left Arm;Right lower leg (including foot);Abdomen;Left lower leg (including foot);Front perineal area;Buttocks Bathing: 5: Supervision: Safety issues/verbal cues  FIM - Upper Body Dressing/Undressing Upper body dressing/undressing steps patient completed: Thread/unthread right sleeve of pullover shirt/dresss;Put head through opening of pull over shirt/dress;Pull shirt over trunk;Thread/unthread right sleeve of front closure shirt/dress Upper body dressing/undressing: 5: Supervision: Safety issues/verbal cues FIM - Lower Body Dressing/Undressing Lower body dressing/undressing steps patient completed: Thread/unthread right underwear leg;Thread/unthread left underwear leg;Pull underwear up/down;Don/Doff right sock;Don/Doff left sock Lower body dressing/undressing: 5: Supervision: Safety issues/verbal cues  FIM - Toileting Toileting steps completed by patient: Adjust clothing prior to toileting;Performs perineal hygiene;Adjust clothing after toileting Toileting Assistive Devices: Grab bar or rail for support Toileting: 6: Assistive device: No helper  FIM - Diplomatic Services operational officerToilet Transfers Toilet Transfers Assistive Devices: Art gallery managerWalker Toilet Transfers: 5-To toilet/BSC: Supervision (verbal cues/safety issues);5-From toilet/BSC: Supervision (verbal cues/safety issues)  FIM - BankerBed/Chair Transfer Bed/Chair Transfer Assistive Devices: Walker;Arm rests Bed/Chair Transfer: 5: Bed > Chair or W/C: Supervision (verbal cues/safety issues);5: Chair or W/C > Bed: Supervision (verbal cues/safety issues)  FIM - Locomotion: Wheelchair Distance: 120 Locomotion: Wheelchair: 2: Travels 50 - 149 ft with minimal assistance (Pt.>75%) FIM - Locomotion: Ambulation Locomotion: Ambulation Assistive Devices: Designer, industrial/productWalker - Rolling Ambulation/Gait Assistance: 5:  Supervision Locomotion: Ambulation: 5: Travels 150 ft or more with supervision/safety issues  Comprehension Comprehension Mode: Auditory Comprehension: 3-Understands basic 50 -  74% of the time/requires cueing 25 - 50%  of the time  Expression Expression Mode: Verbal Expression: 4-Expresses basic 75 - 89% of the time/requires cueing 10 - 24% of the time. Needs helper to occlude trach/needs to repeat words.  Social Interaction Social Interaction: 4-Interacts appropriately 75 - 89% of the time - Needs redirection for appropriate language or to initiate interaction.  Problem Solving Problem Solving: 3-Solves basic 50 - 74% of the time/requires cueing 25 - 49% of the time  Memory Memory Assistive Devices: Other (Comment) Memory: 2-Recognizes or recalls 25 - 49% of the time/requires cueing 51 - 75% of the time  Medical Problem List and Plan:  1. Deconditioning related to multiple medical issues/encephalopathy  2. DVT Prophylaxis/Anticoagulation: Subcutaneous heparin.    3. Pain Management: Tylenol as needed.   4. Mood/encephalopathy: Continue Risperdal twice a day   -added an HS dose prn for agitation  -remains impulsive----needs supervision 5. Neuropsych: This patient is not capable of making decisions on his own behalf.  6. End-stage renal disease with hemodialysis. Followup per renal services  7.NSTEMI. Continue aspirin/ Plavix/Imdur and Coreg 12.5 mg twice a day. Recent cardiac catheterization 3 vessel CAD. Continue medical management---asymptomatic at present 8. Chronic anemia. Continue Aranesp  9. Positive Clostridium difficile. Continue Flagyl initiated 07/18/2012 x14 days total. Contact precautions  10. Diabetes mellitus with peripheral neuropathy. 70/30 insulin resumed at 15u bid yesterday---sugars bottomed out  -adjusted to 15u daily today---added 5mg  hs---sugars leveling out  -Continue sliding scale insulin covg as well 11. Hyperlipidemia. Lipitor  LOS (Days) 6 A FACE TO  FACE EVALUATION WAS PERFORMED  Ranelle OysterZachary T Maleki Hippe 07/26/2013 7:53 AM

## 2013-07-26 NOTE — Progress Notes (Signed)
Social Work Patient ID: [name], male   DOB: [birth year], 80 y.o.   MRN: [id number]  Antonio JupiterLucy Kachina Niederer, LCSW Social Worker Signed  Patient Care Conference Service date: 07/26/2013 8:48 AM  Inpatient RehabilitationTeam Conference and Plan of Care Update Date: 07/25/2013   Time: 2:50 PM     Patient Name: [name]       Medical Record Number: [id number]   Date of Birth: [birth year] Sex: Male         Room/Bed: 4W03C/4W03C-01 Payor Info: Payor: MEDICARE / Plan: MEDICARE PART A AND B / Product Type: *No Product type* /   Admitting Diagnosis: Deconditioned with enceph   Admit Date/Time:  07/20/2013  6:42 PM Admission Comments: No comment available   Primary Diagnosis:  <principal problem not specified> Principal Problem: <principal problem not specified>    Patient Active Problem List     Diagnosis  Date Noted   .  Physical deconditioning  07/20/2013   .  Non-ST elevation myocardial infarction (NSTEMI), initial episode of care  07/18/2013   .  Acute encephalopathy  07/09/2013   .  ESRD on dialysis  07/09/2013   .  Unspecified essential hypertension  07/09/2013   .  Type II or unspecified type diabetes mellitus with peripheral circulatory disorders, uncontrolled(250.72)  07/09/2013   .  Acute respiratory failure  07/09/2013   .  PVD (peripheral vascular disease)  05/15/2013   .  Aftercare following surgery of the circulatory system, NEC  05/15/2013   .  Peripheral vascular disease, unspecified  11/03/2011     Expected Discharge Date: Expected Discharge Date: 07/27/13  Team Members Present: Physician leading conference: Dr. Faith RogueZachary Swartz Social Worker Present: Antonio JupiterLucy Eathel Pajak, LCSW Nurse Present: Carlean PurlMaryann Barbour, RN PT Present: Cyndia SkeetersBridgett Ripa, Scot JunPT;Caroline King, PT OT Present: Roney MansJennifer Smith, OT;Ardis Rowanom Lanier, COTA SLP Present: Feliberto Gottronourtney Payne, SLP PPS Coordinator present : Tora DuckMarie Noel, RN, CRRN;Becky Henrene DodgeWindsor, PT        Current Status/Progress  Goal  Weekly Team Focus   Medical    DECONDITIONING/ENCEPHALOPATHY---STILL CONUFSED HS  IMPROVE SAFETY AWARENESS  SAFETY, HD, CV MANAGEMENT   Bowel/Bladder     cont of bowel, LBM 4/20; anuric, HD Tue, Thu, Sat  remain cont of bowel  monitor   Swallow/Nutrition/ Hydration     Dys. 2 textures with thin liquids, full supervision   supervision  trials of Dys. 3 textures   ADL's     Occasional minimal assistance with ADL  Supervision  Activity tolerance, balance, functional problem solving   Mobility     supervision overall with instances of minA  supervision overall-will need 24/7 due to cognitive deficits  safety, cognitive remediation, functional mobility, balance, activity tolerance   Communication            Safety/Cognition/ Behavioral Observations    Mod-Max A  Min A  problem solving, orientation, attention, memory    Pain     n/a  remain pain free  monitor   Skin     CDI  no infection or skin breakdown while in Rehab  assess qshift and prn    Rehab Goals Patient on target to meet rehab goals: Yes *See Care Plan and progress notes for long and short-term goals.    Barriers to Discharge:  POOR COGNITION, SAFETY      Possible Resolutions to Barriers:    SUPERVISION AT HOME      Discharge Planning/Teaching Needs:    home with family to provide 24/7 assistance;  likely to plan a move  later to [place] to live [place] family   planning to do with caregiver, Byrd HesselbachMaria    Team Discussion:    Pt very confused with, likely, baseline dementia.  Already at a supervision level.  Impulsive and NO carryover of information from session to session.  Must begin family ed ASAP.  Approaching goals.   Revisions to Treatment Plan:    None    Continued Need for Acute Rehabilitation Level of Care: The patient requires daily medical management by a physician with specialized training in physical medicine and rehabilitation for the following conditions: Daily direction of a multidisciplinary physical rehabilitation program to ensure  safe treatment while eliciting the highest outcome that is of practical value to the patient.: Yes Daily analysis of laboratory values and/or radiology reports with any subsequent need for medication adjustment of medical intervention for : Neurological problems;Pulmonary problems;Other  [name] 07/26/2013, 8:48 AM

## 2013-07-26 NOTE — Progress Notes (Signed)
Physical Therapy Session Note  Patient Details  Name: [name] MRN: [id number] Date of Birth: May 06, 1933  Today's Date: 07/26/2013 Time: 800-830 and 1300-1340  Time Calculation (min): 30 min and 40 min   Short Term Goals: Week 1:  PT Short Term Goal 1 (Week 1): STGs=LTGs due to ELOS  Skilled Therapeutic Interventions/Progress Updates:   AM Session: Pt received sleeping in bed. Family not present for family training. Upon waking pt appearing disoriented, attempted to re-orient patient throughout session but unsuccessful without interpreter present. Focus on safety with ambulation and stair negotiation. Gait training 150' without AD and 150' x 2 using RW and supervision overall in controlled environment, 25' using RW in home environment with carpet and supervision, requiring mod-max verbal cues for safety using RW including obstacle negotiation. Stair negotiation up/down 1 flight of stairs using single rail with step-over-step pattern ascending with close supervision and step-to pattern descending with min A, verbal cues required for safe foot placement. Pt returned to room and left semi reclined in bed with all needs within reach and alarm on awaiting OT session.   PM Session: Pt received sleeping in bed, interpreter present for session. Minister arrived to see if pt would like to participate in communion, unable to partake due to special diet and pt participated in prayer/conversation. Focus on activity tolerance and safety awareness with community ambulation. Gait training around rehab unit in controlled environment 300 ft x 2 with 1 seated rest using RW and supervision with verbal cues for obstacle negotiation and safe use of RW. Gait training in community and outdoor environment >500 ft using RW with supervision/cueing for safety and RW management and total of 3 seated rest breaks. Pt with excellent activity tolerance and no c/o dizziness this PM. Pt left in GeriChair with quick release  belt donned at [employer] station, notified staff of lunch still in pt's room as pt was asleep and did not eat before session.   Therapy Documentation Precautions:  Precautions Precautions: Fall Precaution Comments: spanish speaking, poor safety awareness Restrictions Weight Bearing Restrictions: No  Pain:  Denies pain  See FIM for current functional status  Therapy/Group: Individual Therapy  Kerney ElbeRebecca A Varner 07/26/2013, 12:30 PM

## 2013-07-26 NOTE — Progress Notes (Signed)
SLP Cancellation Note  Patient Details Name: Titus Mouldntonio Music MRN: [id number] DOB: 1933-10-05   Cancelled treatment:       Amount of Missed SLP Time (min): 45 Minutes                                                                                          Missed Time Reason: Patient fatigue  SLP attempted trial tray of advanced Dys 3 textures, however patient was unable to be adequately aroused to participate in therapy. RN made aware. No family was present for education/training today. Recommend for patient to remain on Dys 3 textures as his dysphagia is primarily cognitively based due to impulsivity and fast rate, and with full supervision he can be cued to slow down.    Maxcine HamLaura Paiewonsky, M.A. CCC-SLP [phone]  Maxcine HamLaura Paiewonsky 07/26/2013, 3:44 PM

## 2013-07-26 NOTE — Progress Notes (Signed)
Patient arrived from hemodialysis at 2135 asleep and snoring after Ativan 1mg  IV was administered at 1933 (see MAR) due to agitation and trying to get out of the bed per RN report. Patient refused to wake up, did not eat supper tray and not alert enough to take his night time medicine. RN waited until midnight to see if patient will wake up, but continued to sleep and is snoring. Patient not getingt rest and sleep the night before may have contributed to patient's current sleepiness. Night time medicines not given due to drowsiness and not being alert enough, especially being on a dysphagia 3 diet and pills whole in puree.

## 2013-07-26 NOTE — Discharge Summary (Signed)
  Discharge summary job [id number]

## 2013-07-26 NOTE — Progress Notes (Signed)
Physical Therapy Session Note  Patient Details  Name: [name] MRN: [id number] Date of Birth: 03-03-1934  Today's Date: 07/26/2013 Time: [id number] Time Calculation (min): 45 min  Skilled Therapeutic Interventions/Progress Updates:  1:1. Pt received sitting in geri chair at nurses station, ready for therapy. Interpreter present to assist with session, however, family not present to participate in formal family training. Focus this session on general safety during functional mobility. Pt with good tolerance to amb 300'x2 and 150'x1 w/ use of RW in controlled and home carpeted areas w/ overall supervision. Pt req min-mod cueing throughout session regarding management of RW during turns and prior to sitting as well as awareness of obstacles on R side. Pt able to safely demonstrate functional transfers, car transfer and bed mobility(standard bed simulated by tx mat) w/ overall supervision. Pt able to negotiate up/down 12, 6" steps w/ use of single rail using combination of reciprocal pattern ascending, but step-to pattern descending w/ supervision and intermittent cueing to attend to foot placement on step. Pt also demonstrates ability to maintain dynamic standing balance tasks in room, such as washing hands, with close supervision. Pt sitting in geri chair in room at end of session w/ quick release belt in place and in care of nurse tech.   Therapy Documentation Precautions:  Precautions Precautions: Fall Precaution Comments: spanish speaking, poor safety awareness Restrictions Weight Bearing Restrictions: No  See FIM for current functional status  Therapy/Group: Individual Therapy  Denzil HughesCaroline S Delshawn Stech 07/26/2013, 11:19 AM

## 2013-07-26 NOTE — Progress Notes (Signed)
Subjective: No complaints, up in chair  Filed Vitals:   07/25/13 2147 07/26/13 0500 07/26/13 0625 07/26/13 0840  BP: 145/71  150/38 135/53  Pulse: 78  71 80  Temp: 97.8 F (36.6 C)  98.8 F (37.1 C)   TempSrc: Axillary  Oral   Resp: 16  17   Weight:  59.5 kg (131 lb 2.8 oz)    SpO2: 95%  97%    Exam: Up in chair, no distress No jvd Chest clear bilat RRR 2/6 SEM, no RG Abd soft, NTND No LE or UE edema LUA AVF patent Neuro is Ox3, nonfocal  Dialysis: MWF [facility]  3h 15min   67kg    2/2.5 Bath  P4    LUA AVF    Heparin 2000  Epo 6200    Venofer 100 /wk on Wed  Assessment: 1 Debility- on rehab 2 NSTEMI / 3V CAD s/p PCI x 2- on BB, asa, plavix, Imdur; not a surgical candidate 3 Pulm edema- resolved 4 ESRD- off schedule 5 HTN/volume excess- resolved, new dry wt ~59kg, on coreg only 6 Anemia on aranesp 7 MBD no binders or vit D 8 Cdif on flagyl po 9 DM2  Plan- HD tomorrow am off schedule, probable discharge soon per Rehab personnel   Vinson Moselleob Thos Matsumoto MD  pager [phone]    cell [phone]  07/26/2013, 11:30 AM     Recent Labs Lab 07/20/13 0500 07/20/13 2010 07/22/13 1752 07/25/13 1700  NA 134*  --  131* 129*  K 3.9  --  4.2 4.5  CL 90*  --  88* 88*  CO2 22  --  22 20  GLUCOSE 239*  --  144* 87  BUN 70*  --  74* 91*  CREATININE 9.47* 5.09* 9.46* 10.67*  CALCIUM 7.7*  --  7.8* 7.8*  PHOS 3.6  --  4.4 5.2*    Recent Labs Lab 07/20/13 0500 07/22/13 1752 07/25/13 1700  ALBUMIN 2.8* 2.8* 3.0*    Recent Labs Lab 07/20/13 2010 07/22/13 1752 07/25/13 1700  WBC 10.9* 9.6 10.1  HGB 9.6* 8.6* 7.9*  HCT 29.1* 25.8* 23.3*  MCV 96.0 93.8 93.6  PLT 340 321 378   . antiseptic oral rinse  15 mL Mouth Rinse QID  . aspirin  81 mg Per Tube Daily  . atorvastatin  40 mg Oral q1800  . carvedilol  12.5 mg Oral BID WC  . chlorhexidine  15 mL Mouth Rinse BID  . clopidogrel  75 mg Oral Q breakfast  . darbepoetin (ARANESP) injection - DIALYSIS  40 mcg Intravenous Q  Mon-HD  . famotidine  20 mg Per Tube Daily  . feeding supplement (NEPRO CARB STEADY)  237 mL Oral BID BM  . heparin  5,000 Units Subcutaneous 3 times per day  . insulin aspart  0-9 Units Subcutaneous TID WC & HS  . insulin aspart protamine- aspart  15 Units Subcutaneous Q breakfast  . insulin aspart protamine- aspart  5 Units Subcutaneous Q supper  . isosorbide mononitrate  30 mg Oral Daily  . metroNIDAZOLE  500 mg Oral 3 times per day  . multivitamin  1 tablet Oral QHS  . risperiDONE  0.5 mg Oral Daily  . risperiDONE  1 mg Oral QHS     acetaminophen, ondansetron (ZOFRAN) IV, ondansetron, sorbitol

## 2013-07-26 NOTE — Discharge Summary (Signed)
NAMTitus Walter:  [name]            ACCOUNT NO.:  [id number]  MEDICAL RECORD NO.:  [id number]  LOCATION:  4W03C                        FACILITY:  [facility]  PHYSICIAN:  Ranelle OysterZachary T. Swartz, M.D.DATE OF BIRTH:  [birth year]  DATE OF ADMISSION:  07/20/2013 DATE OF DISCHARGE:  07/27/2013                              DISCHARGE SUMMARY   DISCHARGE DIAGNOSES: 1. Deconditioning related to multi medical issues, encephalopathy. 2. Subcutaneous heparin for deep venous thrombosis prophylaxis. 3. Pain management. 4. Mood with encephalopathy. 5. End-stage renal disease with hemodialysis. 6. Non-ST segment elevation myocardial infarction. 7. Chronic anemia. 8. Positive Clostridium difficile. 9. Diabetes mellitus, peripheral neuropathy. 10.Hyperlipidemia.  HISTORY OF PRESENT ILLNESS:  This is a 80 year old right-handed non- English-speaking Spanish male with end-stage renal disease, hemodialysis, peripheral vascular disease, and diabetes mellitus. Admitted on July 09, 2013 from outside hospital after presenting with fever, cough and altered mental status.  The patient noted to be very hypoxic, felt to be secondary to pulmonary edema per chest x-ray and ultimately was intubated.  [facility]  He was ultimately extubated on July 15, 2013. Noted findings of elevated troponin 9.94 with acute ST elevation on EKG, suspect NSTEMI and Cardiology Service has consulted.  Underwent cardiac catheterization on July 12, 2013, per Dr. SwazilandJordan, showing severe 3- vessel obstructive coronary artery disease, placed on aspirin and Plavix therapy.  Subcutaneous heparin for DVT prophylaxis.  Hemodialysis ongoing as per Renal Services.  Clostridium difficile specimen positive, placed on Flagyl for 14 days as well as contact precautions.  Maintained on a dysphagia 2 thin liquid diet.  The patient was admitted for comprehensive rehab program.  PAST MEDICAL HISTORY:  See  discharge diagnoses.  SOCIAL HISTORY:  Lives with spouse.  FUNCTIONAL HISTORY:  Prior to admission, independent.  FUNCTIONAL STATUS:  [name] was ambulating 12 feet with moderate cues with a forward gaze.  PHYSICAL EXAMINATION:  VITAL SIGNS:  Blood pressure 137/38, pulse 72, temperature 98, respirations 16. GENERAL:  This was an alert male, speaks very little [place]. HEENT:  Pupils were round and reactive to light. LUNGS:  Clear to auscultation. CARDIAC:  Rate controlled. ABDOMEN:  Soft, nontender.  Good bowel sounds.  REHABILITATION HOSPITAL COURSE:  The patient was admitted to Inpatient Rehab Services with therapies initiated on a 3-hour daily basis consisting of physical therapy, occupational therapy, speech therapy, and rehabilitation nursing.  The following issues were addressed during the patient's rehabilitation stay.  Pertaining to [name] deconditioning related to multi medical remained stable, he participated with therapies.  Subcutaneous heparin for DVT prophylaxis through his rehab course was discontinued at time of discharge, no bleeding episodes.  He remained on dialysis as per Renal Services. Plan by family was to go to [place] with [place] family to provide assistance and arrange necessary dialysis as needed. Followup Cardiology Services for NSTEMI, maintained on aspirin and Plavix therapy, no chest pain or shortness of breath.  Chronic anemia.  He continued on Aranesp per Renal Services.  Contact precautions for Clostridium difficile.  No diarrhea reported.  He did have a history of diabetes mellitus, peripheral neuropathy.  Maintained on insulin therapy as directed.  The patient  received weekly collaborative interdisciplinary team conferences to discuss estimated length of stay, family teaching, and any barriers to discharge.  He was ambulating greater than 200 feet with a rolling walker without assistive device, required minimal  assistance, cues for appropriate step and stride length.  Sessions focused on functional transfers, furniture transfers, dynamic standing balance, ongoing recommendations of the family for need for supervision for his safety.  He needed only simple commands to maintain his safety.  Sequencing and task organization.  Plan was for discharge to home with family.  There was possibilities of patient going to [place] for ongoing family assistance where dialysis was to be arranged by family.  DISCHARGE MEDICATIONS: 1. Aspirin 81 mg p.o. daily. 2. Lipitor 40 mg p.o. daily. 3. Coreg 12.5 mg p.o. b.i.d. 4. Plavix 75 mg p.o. daily. 5. Aranesp as advised with dialysis. 6. Pepcid 20 mg p.o. daily. 7. NovoLog 70/30 15 units at breakfast and 5 units at supper. 8. Imdur 30 mg p.o. daily. 9. Flagyl 500 mg p.o. every 8 hours through August 03, 2013 and stop. 10.Multivitamin daily. 11.Risperdal 0.5 mg p.o. daily and 1 mg at bedtime.  DIET:  Dysphagia 2 renal diet.  FOLLOWUP:  The patient will follow up with Dr. Faith RogueZachary Swartz at the Outpatient Rehab Service Office as directed; Dr. Peter SwazilandJordan, Cardiology Service, call for appointment; Dr. Delano Metzobert Schertz, Renal Services; Dr. Charlott RakesFrancisco Hodges, call for appointment.  SPECIAL INSTRUCTIONS:  Continue dialysis as directed.     Mariam Dollaraniel Angiulli, P.A.   ______________________________ Ranelle OysterZachary T. Swartz, M.D.    DA/MEDQ  D:  07/26/2013  T:  07/26/2013  Job:  [id number]  cc:   Maree Krabbeobert D. Schertz, M.D. Peter M. SwazilandJordan, M.D. Charlott RakesFrancisco Hodges, MD

## 2013-07-27 ENCOUNTER — Inpatient Hospital Stay (HOSPITAL_COMMUNITY): Payer: Medicare Other

## 2013-07-27 LAB — GLUCOSE, CAPILLARY
GLUCOSE-CAPILLARY: 235 mg/dL — AB (ref 70–99)
Glucose-Capillary: 124 mg/dL — ABNORMAL HIGH (ref 70–99)

## 2013-07-27 MED ORDER — CARVEDILOL 12.5 MG PO TABS: 12.5000 mg | ORAL_TABLET | Freq: Two times a day (BID) | ORAL | Status: AC

## 2013-07-27 MED ORDER — ATORVASTATIN CALCIUM 40 MG PO TABS: 40.0000 mg | ORAL_TABLET | Freq: Every day | ORAL | Status: AC

## 2013-07-27 MED ORDER — DARBEPOETIN ALFA-POLYSORBATE 40 MCG/0.4ML IJ SOLN
INTRAMUSCULAR | Status: AC
  Filled 2013-07-27: qty 0.4

## 2013-07-27 MED ORDER — SODIUM CHLORIDE 0.9 % IV SOLN: 100.0000 mL | INTRAVENOUS | Status: DC | PRN

## 2013-07-27 MED ORDER — ALTEPLASE 2 MG IJ SOLR: 2.0000 mg | Freq: Once | INTRAMUSCULAR | Status: DC | PRN

## 2013-07-27 MED ORDER — LIDOCAINE-PRILOCAINE 2.5-2.5 % EX CREA: 1.0000 "application " | TOPICAL_CREAM | CUTANEOUS | Status: DC | PRN

## 2013-07-27 MED ORDER — CLOPIDOGREL BISULFATE 75 MG PO TABS: 75.0000 mg | ORAL_TABLET | Freq: Every day | ORAL | Status: AC

## 2013-07-27 MED ORDER — METRONIDAZOLE 500 MG PO TABS: 500.0000 mg | ORAL_TABLET | Freq: Three times a day (TID) | ORAL | Status: AC

## 2013-07-27 MED ORDER — FAMOTIDINE 20 MG PO TABS: 20.0000 mg | ORAL_TABLET | Freq: Every day | ORAL | Status: AC

## 2013-07-27 MED ORDER — ISOSORBIDE MONONITRATE ER 30 MG PO TB24: 30.0000 mg | ORAL_TABLET | Freq: Every day | ORAL | Status: AC

## 2013-07-27 MED ORDER — PENTAFLUOROPROP-TETRAFLUOROETH EX AERO: 1.0000 "application " | INHALATION_SPRAY | CUTANEOUS | Status: DC | PRN

## 2013-07-27 MED ORDER — DARBEPOETIN ALFA-POLYSORBATE 40 MCG/0.4ML IJ SOLN
40.0000 ug | INTRAMUSCULAR | Status: DC
  Administered 2013-07-27: 40 ug via INTRAVENOUS
  Filled 2013-07-27 (×2): qty 0.4

## 2013-07-27 MED ORDER — HEPARIN SODIUM (PORCINE) 1000 UNIT/ML DIALYSIS
2000.0000 [IU] | Freq: Once | INTRAMUSCULAR | Status: AC
  Administered 2013-07-27: 2000 [IU] via INTRAVENOUS_CENTRAL

## 2013-07-27 MED ORDER — RISPERIDONE 0.5 MG PO TABS: ORAL_TABLET | ORAL | Status: AC

## 2013-07-27 MED ORDER — HEPARIN SODIUM (PORCINE) 1000 UNIT/ML DIALYSIS: 1000.0000 [IU] | INTRAMUSCULAR | Status: DC | PRN

## 2013-07-27 MED ORDER — ASPIRIN 81 MG PO TABS: 81.0000 mg | ORAL_TABLET | Freq: Every day | ORAL | Status: AC

## 2013-07-27 MED ORDER — LIDOCAINE HCL (PF) 1 % IJ SOLN: 5.0000 mL | INTRAMUSCULAR | Status: DC | PRN

## 2013-07-27 MED ORDER — NEPRO/CARBSTEADY PO LIQD: 237.0000 mL | ORAL | Status: DC | PRN

## 2013-07-27 MED ORDER — INSULIN ASPART PROT & ASPART (70-30 MIX) 100 UNIT/ML ~~LOC~~ SUSP: SUBCUTANEOUS | Status: AC

## 2013-07-27 NOTE — Progress Notes (Signed)
Social Work Discharge Note  The overall goal for the admission was met for:   Discharge location: Yes - pt went home to dtr's home in [place]  Length of Stay: Yes - 7 days  Discharge activity level: Yes - supervision  Home/community participation: Yes  Services provided included: MD, RD, PT, OT, SLP, RN, Pharmacy and SW  Financial Services: Medicare and Medicaid  Follow-up services arranged: DME: Conservation officer, nature and Patient/Family has no preference for HH/DME agencies  Comments (or additional information):  Pt will need follow up PT, OT, ST, and RN from home health in [place].  Dtr to arrange this through pt's new physician next week.  Patient/Family verbalized understanding of follow-up arrangements: Yes  Individual responsible for coordination of the follow-up plan: pt's dtr  Confirmed correct DME delivered: [name] 07/27/2013    [name]

## 2013-07-27 NOTE — Progress Notes (Signed)
[facility] PHYSICAL [facility]ICINE & REHABILITATION     PROGRESS NOTE    Subjective/Complaints: No new issues. Up at nurses desk. Pleasantly confused   Objective: Vital Signs: Blood pressure 91/37, pulse 62, temperature 98.2 F (36.8 C), temperature source Oral, resp. rate 16, weight 57.4 kg (126 lb 8.7 oz), SpO2 99.00%. No results found.  Recent Labs  07/25/13 1700  WBC 10.1  HGB 7.9*  HCT 23.3*  PLT 378    Recent Labs  07/25/13 1700  NA 129*  K 4.5  CL 88*  GLUCOSE 87  BUN 91*  CREATININE 10.67*  CALCIUM 7.8*   CBG (last 3)   Recent Labs  07/26/13 1642 07/26/13 2053 07/27/13 0706  GLUCAP 130* 134* 235*    Wt Readings from Last 3 Encounters:  07/27/13 57.4 kg (126 lb 8.7 oz)  07/20/13 60.9 kg (134 lb 4.2 oz)  07/20/13 60.9 kg (134 lb 4.2 oz)    Physical Exam:  HENT: oral mucosa pink and moist Head: Normocephalic.  Eyes: EOM are normal.  Neck: Normal range of motion. Neck supple. No thyromegaly present.  Cardiovascular: Normal rate and regular rhythm.  Respiratory: Effort normal and breath sounds normal. No respiratory distress.  GI: Soft. Bowel sounds are normal. He exhibits no distension.  Neurological: He is alert. No cranial nerve deficit. He exhibits normal muscle tone.  Alert but  confused. Impulsive. Follows simple commands Moves all 4's. UE 3+ to 4/5 prox to distal. LE 3/5 HF, KE and 4/5 ankles. Senses pain in all 4's Skin: Skin is warm and dry. Ext: no edema Psychiatric: He has a normal mood and affect. Non-agitated. pleasant   Assessment/Plan: 1. Functional deficits secondary to deconditioning and encephalopathy which require 3+ hours per day of interdisciplinary therapy in a comprehensive inpatient rehab setting. Physiatrist is providing close team supervision and 24 hour management of active medical problems listed below. Physiatrist and rehab team continue to assess barriers to discharge/monitor patient progress toward functional and medical  goals.  [facility] working on [employer] planning, [facility] [place]--Await decision of team   FIM: FIM - Bathing Bathing Steps Patient Completed: Chest;Right upper leg;Right Arm;Left upper leg;Left Arm;Right lower leg (including foot);Abdomen;Left lower leg (including foot);Front perineal area;Buttocks Bathing: 5: Supervision: Safety issues/verbal cues  FIM - Upper Body Dressing/Undressing Upper body dressing/undressing steps patient completed: Thread/unthread right sleeve of front closure shirt/dress;Thread/unthread left sleeve of front closure shirt/dress;Button/unbutton shirt;Pull shirt around back of front closure shirt/dress Upper body dressing/undressing: 5: Supervision: Safety issues/verbal cues FIM - Lower Body Dressing/Undressing Lower body dressing/undressing steps patient completed: Thread/unthread right underwear leg;Thread/unthread left underwear leg;Pull underwear up/down;Don/Doff right sock;Don/Doff left sock Lower body dressing/undressing: 5: Supervision: Safety issues/verbal cues  FIM - Toileting Toileting steps completed by patient: Adjust clothing prior to toileting;Performs perineal hygiene;Adjust clothing after toileting Toileting Assistive Devices: Grab bar or rail for support Toileting: 5: Supervision: Safety issues/verbal cues  FIM - Diplomatic Services operational officerToilet Transfers Toilet Transfers Assistive Devices: Art gallery managerWalker Toilet Transfers: 5-To toilet/BSC: Supervision (verbal cues/safety issues);5-From toilet/BSC: Supervision (verbal cues/safety issues)  FIM - BankerBed/Chair Transfer Bed/Chair Transfer Assistive Devices: Walker;Arm rests Bed/Chair Transfer: 5: Supine > Sit: Supervision (verbal cues/safety issues);5: Sit > Supine: Supervision (verbal cues/safety issues);5: Chair or W/C > Bed: Supervision (verbal cues/safety issues);5: Bed > Chair or W/C: Supervision (verbal cues/safety issues)  FIM - Locomotion: Wheelchair Distance: 120 Locomotion: Wheelchair: 0: Activity did not occur (Pt  at ambulatory level) FIM - Locomotion: Ambulation Locomotion: Ambulation Assistive Devices: Designer, industrial/productWalker - Rolling Ambulation/Gait Assistance: 5: Supervision  Locomotion: Ambulation: 5: Travels 150 ft or more with supervision/safety issues  Comprehension Comprehension Mode: Auditory Comprehension: 1-Understands basic less than 25% of the time/requires cueing 75% of the time  Expression Expression Mode: Verbal Expression: 3-Expresses basic 50 - 74% of the time/requires cueing 25 - 50% of the time. Needs to repeat parts of sentences.  Social Interaction Social Interaction: 7-Interacts appropriately with others - No medications needed.  Problem Solving Problem Solving: 3-Solves basic 50 - 74% of the time/requires cueing 25 - 49% of the time  Memory Memory Assistive Devices: Other (Comment) Memory: 3-Recognizes or recalls 50 - 74% of the time/requires cueing 25 - 49% of the time  Medical Problem List and Plan:  1. Deconditioning related to multiple medical issues/encephalopathy  2. DVT Prophylaxis/Anticoagulation: Subcutaneous heparin.    3. Pain Management: Tylenol as needed.   4. Mood/encephalopathy: Continue Risperdal twice a day   -added an HS dose prn for agitation  -remains impulsive----needs supervision 5. Neuropsych: This patient is not capable of making decisions on his own behalf.  6. End-stage renal disease with hemodialysis. Followup per renal services  7.NSTEMI. Continue aspirin/ Plavix/Imdur and Coreg 12.5 mg twice a day. Recent cardiac catheterization 3 vessel CAD. Continue medical management---asymptomatic at present 8. Chronic anemia. Continue Aranesp  9. Positive Clostridium difficile. Continue Flagyl initiated 07/18/2012 x14 days total. Contact precautions  10. Diabetes mellitus with peripheral neuropathy. 70/30 insulin resumed at 15u bid yesterday---sugars bottomed out  -adjusted to 15u daily today---added 5mg  hs---sugars looked better until this am (high 235)--no change  today  -Continue sliding scale insulin covg as well 11. Hyperlipidemia. Lipitor  LOS (Days) 7 A FACE TO FACE EVALUATION WAS PERFORMED  Ranelle OysterZachary T Swartz 07/27/2013 7:50 AM

## 2013-07-27 NOTE — Discharge Instructions (Signed)
Inpatient Rehab Discharge Instructions  [facility] Discharge date and time: No discharge date for patient encounter.   Activities/Precautions/ Functional Status: Activity: activity as tolerated Diet: Renal soft diet Wound Care: none needed Functional status:  ___ No restrictions     ___ Walk up steps independently ___ 24/7 supervision/assistance   ___ Walk up steps with assistance ___ Intermittent supervision/assistance  ___ Bathe/dress independently ___ Walk with walker     ___ Bathe/dress with assistance ___ Walk Independently    ___ Shower independently __x_ Walk with assistance    ___ Shower with assistance ___ No alcohol     ___ Return to work/school ________  COMMUNITY REFERRALS UPON DISCHARGE:   Home Health:   PT     OT     ST    RN    Agency:  We recommend that you get connected with a primary care doctor in [place] and ask that they order and arrange home health services for [name]. Medical Equipment/Items Ordered:  Agricultural consultantolling Walker  Agency/Supplier:  [facility] Other:  He will also need a regular dialysis center and follow up with a nephrologist in [place].  Special Instructions:  Continue dialysis as directed  My questions have been answered and I understand these instructions. I will adhere to these goals and the provided educational materials after my discharge from the hospital.  Patient/Caregiver Signature _______________________________ Date __________  Clinician Signature _______________________________________ Date __________  Please bring this form and your medication list with you to all your follow-up doctor's appointments.

## 2013-07-27 NOTE — Progress Notes (Signed)
Social Work Patient ID: Sabas Sous, male   DOB: 11-03-1933, 80 y.o.   MRN: [id number]  CSW spoke with pt's dtr and met with pt and his dtr to discuss d/c plan.  Pt is being d/c'd today and dtr plans to fly wit him tomorrow to take him to [place] to her home.  CSW strongly encouraged her to get pt connected with an internist on Monday and gave her a copy of pt's chart to take to the new physician.  This new physician can order/arrange home health for pt, as well.  CSW told dtr about this.  Dtr worked with pt's dialysis center in [place] to have his care tx to dialysis center near her home in [place].  Pt will dialyze at one center a little further from her on Saturday and then next week, move to the center close to pt's dtr's home.  CSW ordered rolling walker for pt, but he will need a tub bench and CSW told dtr how to obtain this in [place].  CSW wrote note for dtr to take to housing authority in [place] to obtain first floor housing.  No other needs/concerns noted.

## 2013-07-27 NOTE — Progress Notes (Signed)
Physical Therapy Discharge Summary  Patient Details  Name: [name] MRN: [id number] Date of Birth: [birth year]  Today's Date: 07/27/2013  Patient has met 10 of 11 long term goals due to improved activity tolerance, improved balance, improved postural control, increased strength, improved attention, improved awareness and improved coordination.  Patient to discharge at an ambulatory level Supervision.   Patient's care partner unavailable to provide the necessary cognitive assistance at discharge. Patient's care partners have not been present for formal education due to absence and frequent changes in d/c planning, but it has been stressed by the team via phone that patient requires 24/7 supervision for all functional tasks in order to remain safe secondary to decreased cognition and poor safety awareness.  Reasons goals not met: Patient did not meet cognitive goal of supervision level assist for day to day carry over with mobility techniques.  Recommendation:  Patient will benefit from ongoing skilled PT services in home health setting to continue to advance safe functional mobility, address ongoing impairments in strength, activity tolerance, balance, attention, awareness, safety awareness, overall functional mobility, and minimize fall risk.  Equipment: RW  Reasons for discharge: treatment goals met and discharge from hospital  Patient/family agrees with progress made and goals achieved: Yes  Please see treatment note from 07/26/13 for functional mobility information. Patient is currently functioning at a supervision level overall at an ambulatory level.  PT Discharge Precautions/Restrictions Precautions Precautions: Fall Precaution Comments: spanish speaking, poor safety awareness Restrictions Weight Bearing Restrictions: No  See FIM for current functional status  Ethne Jeon S Flint Melter. Surafel Hilleary, PT, DPT 07/27/2013, 10:58 AM

## 2013-07-27 NOTE — Progress Notes (Signed)
Speech Language Pathology Discharge Summary & Final Treatment Note  Patient Details  Name: [name] MRN: [id number] Date of Birth: [birth year]  Today's Date: 07/27/2013 Time: 1130-1200 Time Calculation (min): 30 min  Skilled Therapeutic Interventions:  Treatment focused on swallowing and cognitive goals as well as education, with daughter present for the final 15 minutes of session. SLP facilitated session with review of current level of function and recommendations. Daughter with many questions regarding d/c planning unrelated to speech therapy, however encouraged to engage in SLP session. Pt required Min cues and extra time for orientation x4. He consumed lunch meal consisting of Dys 3 textures and thin liquids with supervision level cueing with cough x1, and demonstrating basic problem solving during self-feeding task with Min A. SLP provided handout to reinforce education with daughter.    Patient has met 5 of 6 long term goals.  Patient to discharge at [facility] level.  Reasons goals not met: Pt continues to require Mod cueing for intellectual and emergent awareness   Clinical Impression/Discharge Summary: Pt has met 5 out of 6 LTGs this admission due to improvements in sustained attention, orientation, and basic problem solving. Pt continues to require Mod cues for intellectual and emergent awareness, however requires Min cues for problem solving, recall, and sustained attention during basic, familiar tasks. Education offered to patient and daughter. Pt is scheduled to d/c home with daughter, who lives in [place], where it is recommended that he continue to receive [facility] SLP services to maximize swallowing safety and functional independence and to decrease caregiver burden.  Care Partner:  Caregiver Able to Provide Assistance: Yes  Type of Caregiver Assistance: Cognitive  Recommendation:  Home Health SLP;Outpatient SLP;24 hour supervision/assistance  Rationale for SLP Follow  Up: Maximize cognitive function and independence;Maximize swallowing safety;Reduce caregiver burden   Equipment:   N/A  Reasons for discharge: Discharged from hospital   Patient/Family Agrees with Progress Made and Goals Achieved: Yes   See FIM for current functional status   Germain Osgood, M.A. CCC-SLP [phone]  Germain Osgood 07/27/2013, 12:16 PM

## 2013-07-27 NOTE — Progress Notes (Signed)
Physical Therapy Session Note  Patient Details  Name: Titus Mouldntonio Villwock MRN: [id number] Date of Birth: 12-May-1933  Today's Date: 07/27/2013  Patient's daughter, Shanda BumpsJessica, requesting to speak with physical therapist regarding need for RW vs. Rollator. Pt and daughter educated on strong recommendation for 24hr supervision and use of RW for overall increased safety, increased level of functional independence and decreased risk for fall. Use of rollator would be extremely unsafe for pt at this time and significantly increase pt's risk for fall. Further recommended use of transport wheelchair in airport to prevent fall in busy community environment and for energy conservation therefore improving pt's safety when required to perform standing functional mobility. Both pt and daughter verbalized understanding of safety recommendations and benefit of follow [facility] PT to address residual impairments.   Fernande Boydenaroline S Janmichael Giraud S. Jamacia Jester, PT, DPT  07/27/2013, 12:52 PM

## 2013-07-27 NOTE — Progress Notes (Signed)
Pt went to hemodialysis treatment before meeting him this morning.

## 2013-07-27 NOTE — Plan of Care (Signed)
Problem: RH Memory Goal: LTG Patient demonstrate ability for day to day recall (PT) LTG: Patient will demonstrate ability for day to day recall/carryover during mobility activities with assist (PT)  Outcome: Not Met (add Reason) Patient continues to require minA for recall with safety and mobility techniques.

## 2013-08-04 ENCOUNTER — Ambulatory Visit: Payer: Medicare Other | Admitting: Cardiology

## 2013-10-16 ENCOUNTER — Encounter: Payer: Self-pay | Admitting: Vascular Surgery

## 2013-10-17 ENCOUNTER — Encounter (HOSPITAL_COMMUNITY): Payer: Medicare Other

## 2013-10-17 ENCOUNTER — Other Ambulatory Visit (HOSPITAL_COMMUNITY): Payer: Medicare Other

## 2013-10-17 ENCOUNTER — Ambulatory Visit: Payer: Medicare Other | Admitting: Family

## 2014-03-15 ENCOUNTER — Encounter (HOSPITAL_COMMUNITY): Payer: Self-pay | Admitting: Interventional Cardiology

## 2014-06-28 IMAGING — DX DG CHEST 1V PORT
1 series · 1 of 1 positions shown · non-contrast
Comparison: 07/13/2013 and previous

CLINICAL DATA: Layering bilateral pleural effusions

EXAM:
PORTABLE CHEST - 1 VIEW

[portable]
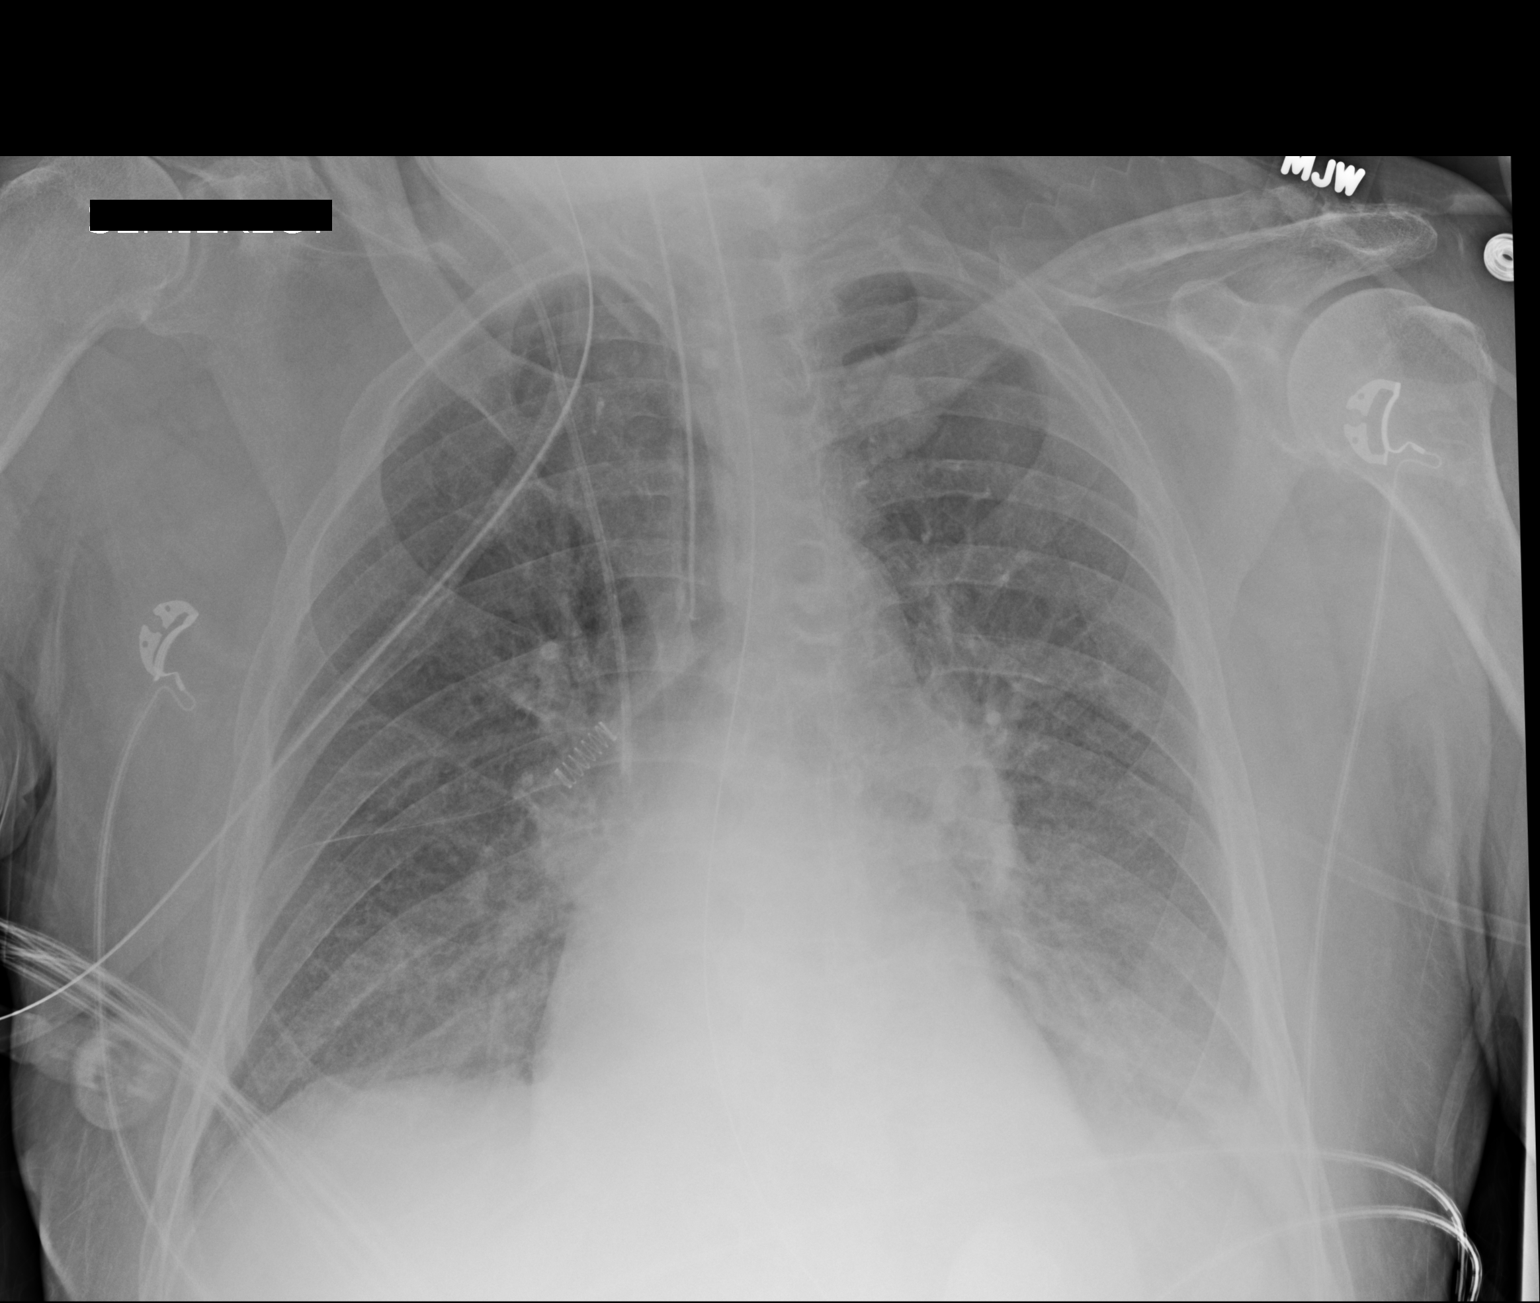

[1 of 1 positions shown; findings below may reference images not displayed]

FINDINGS: Endotracheal tube has its tip 1.5 cm above the carina. Nasogastric
tube enters the abdomen. Right internal jugular central line has its
tip in the SVC above the right atrium. Mild interstitial and
alveolar edema persists. Small amount of layering fluid is probably
present, grossly unchanged. No new finding.
IMPRESSION: Persistent mild residual edema and layering pleural effusions. Lines
and tubes well positioned.

## 2014-07-02 IMAGING — XA IR PTA VENOUS
1 series · 12 of 24 positions shown · non-contrast
Comparison: none

CLINICAL DATA: 79-year-old with end-stage renal disease and pulling
clots during dialysis. The patient has a left upper extremity
cephalic vein fistula.

[Series 1: run · 12 of 68 slices shown]
[im 3/68]
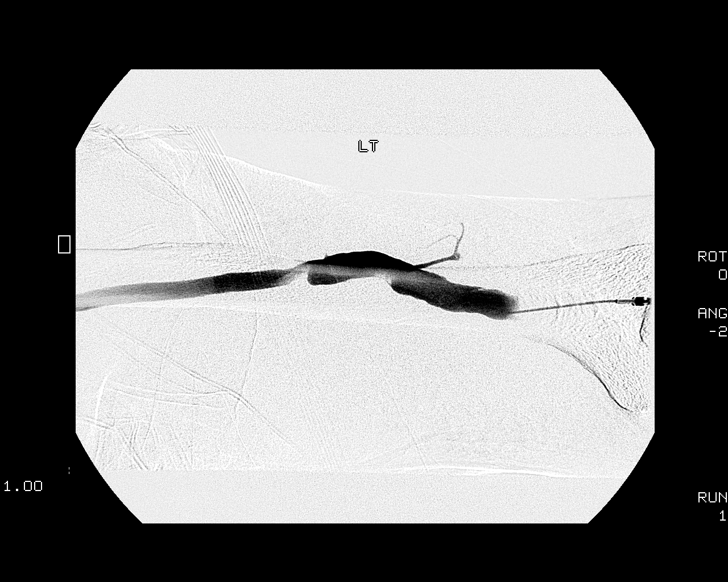
[im 9/68]
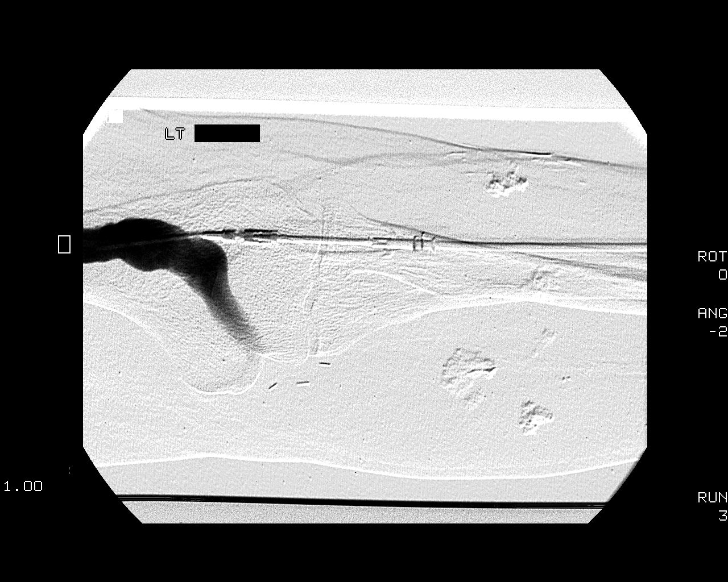
[im 15/68]
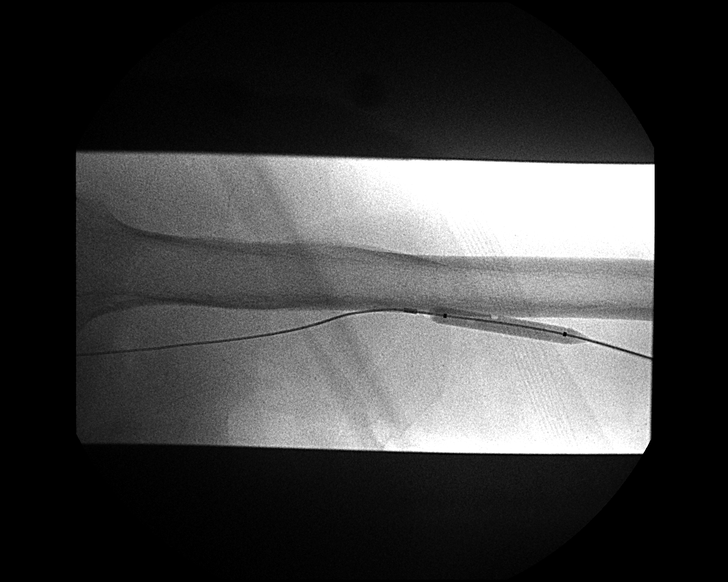
[im 21/68]
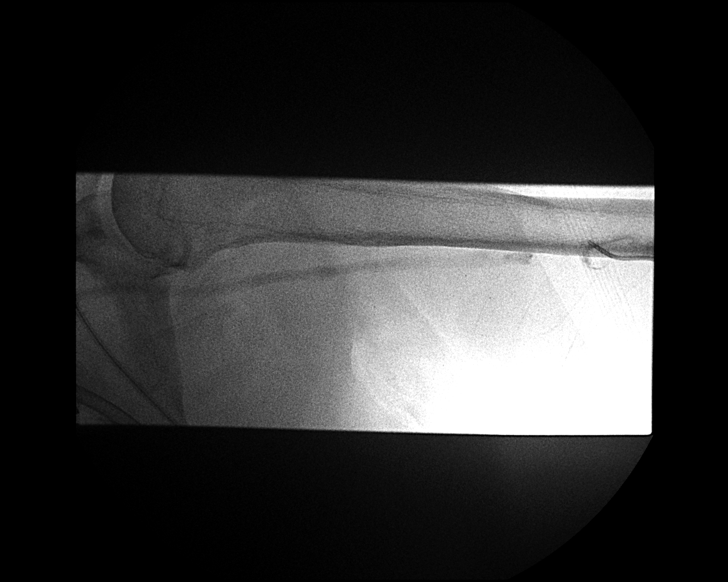
[im 27/68]
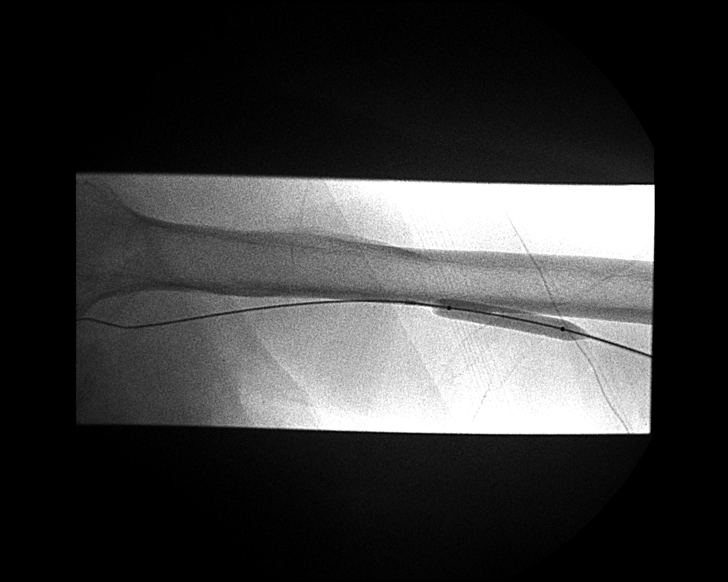
[im 33/68]
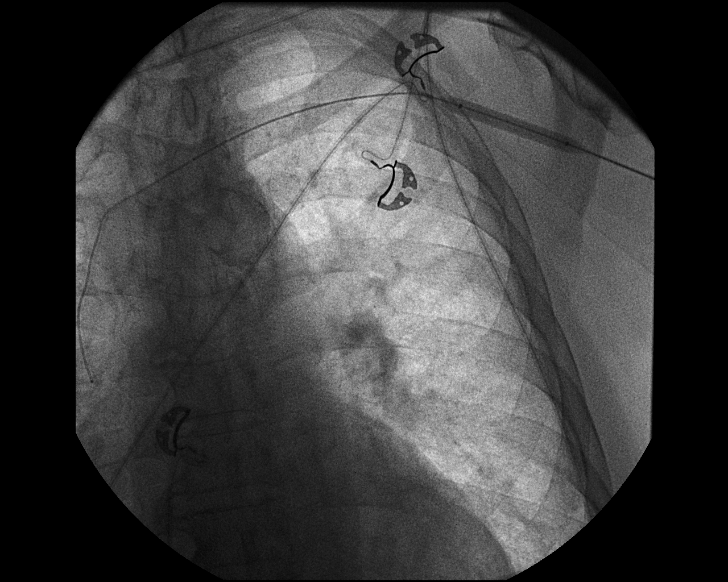
[im 38/68]
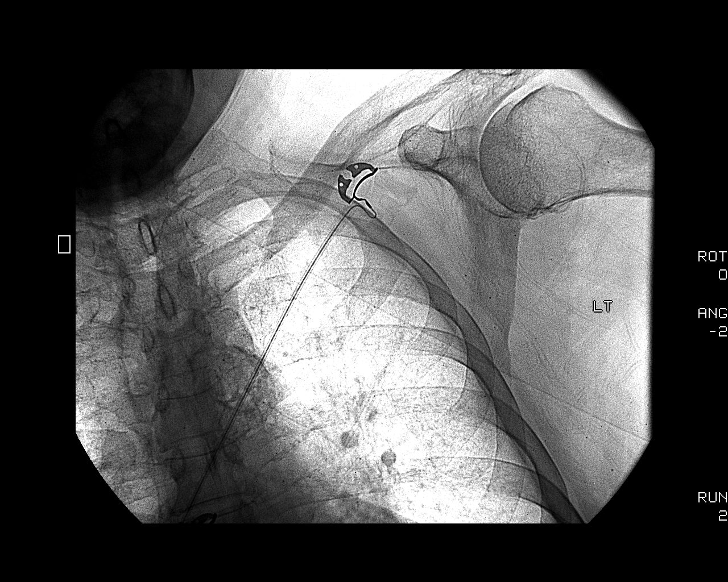
[im 44/68]
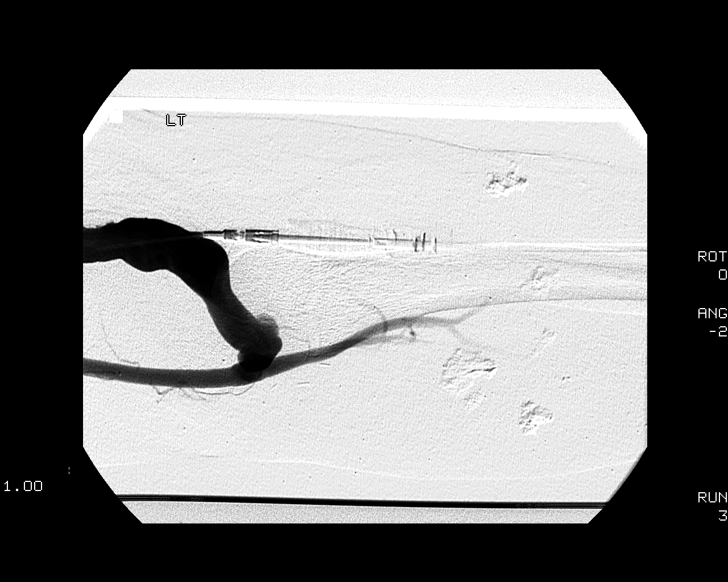
[im 50/68]
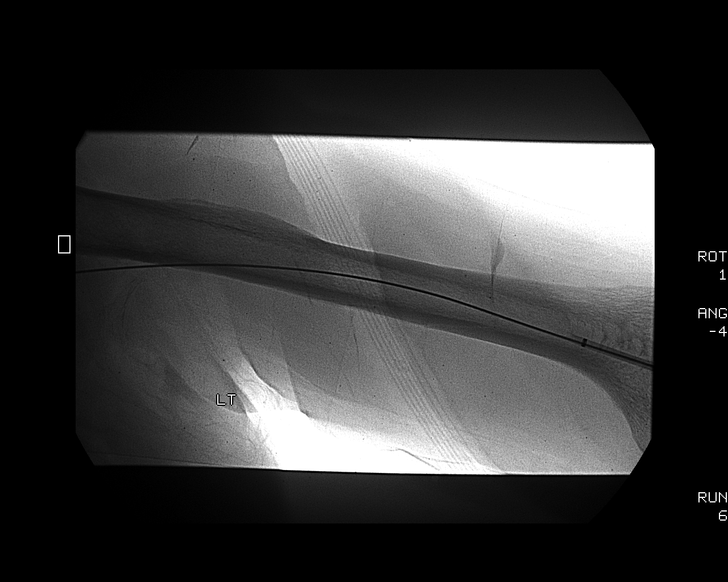
[im 56/68]
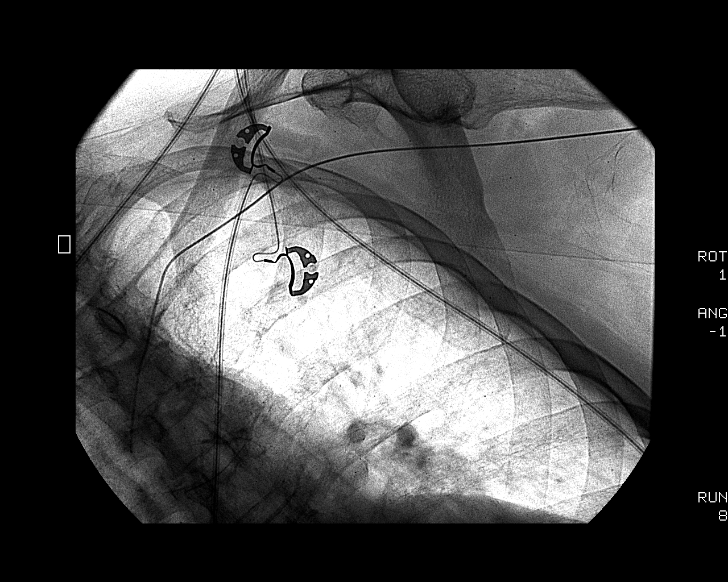
[im 62/68]
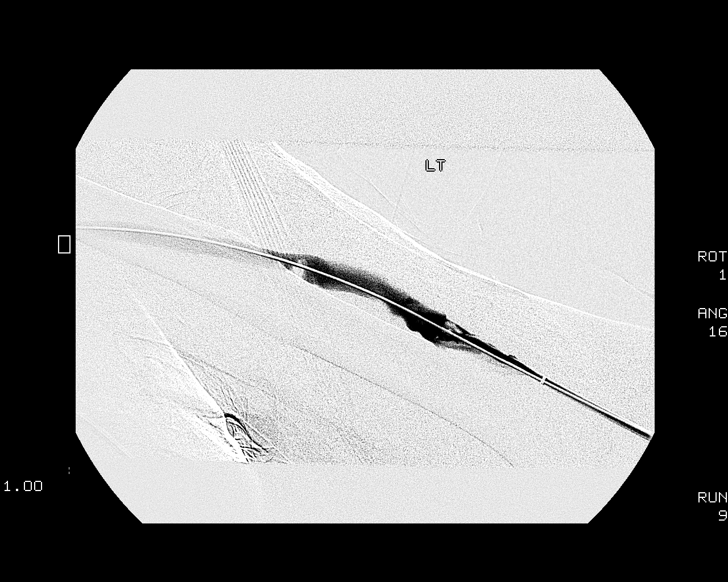
[im 68/68]
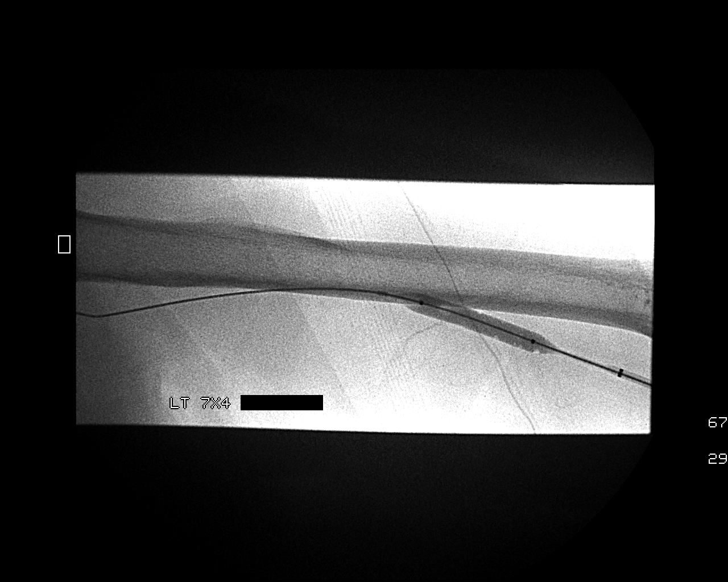

[12 of 24 positions shown; findings below may reference images not displayed]

EXAM:
LEFT UPPER EXTREMITY FISTULOGRAM; VENOUS ANGIOPLASTY; FISTULA DECLOT

FLUOROSCOPY TIME:  12 min and 24 seconds

MEDICATIONS AND MEDICAL HISTORY:
50 mcg fentanyl.

ANESTHESIA/SEDATION:
A radiology nurse monitored the patient throughout the procedure.

PROCEDURE:
The patient has an altered mental status and unable to give consent.
Informed consent was obtained from the patient's daughter. Angiocath
was placed in the left upper arm cephalic vein. Fistulogram images
were obtained. Arm was prepped and draped in a sterile fashion.
Maximal barrier sterile technique was utilized including caps, mask,
sterile gowns, sterile gloves, sterile drape, hand hygiene and skin
antiseptic. The skin was anesthetized with 1% lidocaine. Angiocath
was eventually exchanged for 6 French vascular sheath over a stiff
Amplatz wire. The catheter exchange was difficult due to the
stenosis in the cephalic vein. A Bentson wire was advanced
centrally. At this point, venogram demonstrated that the stenosis or
thrombus was now occlusive. The presumed stenosis was treated with a
6 mm x 40 mm Conquest balloon. Follow venograms demonstrated
persistent occlusion at this site. As a result, the patient was
given 3555 units heparin through the sheath. An additional 4777
units were given approximately 30 per min later. The focal
thrombosis was treated with the Angiojet thrombectomy device. This
area was also treated with a 7 mm x 40 mm Conquest balloon. There
was persistent occlusion in the left cephalic vein. A 5 French
catheter was advanced into the central veins and a pull-back
venogram was obtained. The thrombus had migrated into the upper
cephalic vein. The upper cephalic vein was successfully treated with
the Angiojet thrombectomy device and treated with the 6 mm balloon.
At this point, there was flow throughout the cephalic vein. Follow
up venograms were obtained. The vascular sheath was removed with a
pursestring suture.
FINDINGS: Initial fistulogram images demonstrated a severe stenosis in the
left cephalic vein in the mid humeral region. The central veins and
arterial anastomosis were patent. This severe stenosis was related
to focal thrombus. Following placement of a catheter and wire, there
was thrombosis of the cephalic vein. Ultrasound demonstrated
thrombus in the mid humeral region. This area was treated with the
Angiojet thrombectomy device. Thrombus did migrate into the more
proximal cephalic vein and successful treated with the Angiojet
device. Throughout the procedure, the patient appeared to be
developing additional thrombus in the cephalic vein and, therefore,
the patient was anticoagulated. At the end of procedure, there was
improved flow throughout the left cephalic vein but there are was
nonocclusive residual thrombus in the mid and lower humeral regions.
Central veins are patent.
IMPRESSION: Treatment of focal thrombus in the left cephalic vein. At the end of
the procedure, the occlusive thrombus was removed but there was
residual nonocclusive thrombus.
